# Patient Record
Sex: Male | Born: 1970 | Race: Black or African American | Hispanic: No | Marital: Single | Smoking: Current every day smoker
Health system: Southern US, Community
[De-identification: ages and names within clinical notes are randomized; demographics above are authoritative.]

## PROBLEM LIST (undated history)

## (undated) DIAGNOSIS — K219 Gastro-esophageal reflux disease without esophagitis: Secondary | ICD-10-CM

## (undated) DIAGNOSIS — M25559 Pain in unspecified hip: Secondary | ICD-10-CM

## (undated) DIAGNOSIS — F419 Anxiety disorder, unspecified: Secondary | ICD-10-CM

## (undated) DIAGNOSIS — I10 Essential (primary) hypertension: Secondary | ICD-10-CM

## (undated) DIAGNOSIS — F32A Depression, unspecified: Secondary | ICD-10-CM

## (undated) DIAGNOSIS — F1010 Alcohol abuse, uncomplicated: Secondary | ICD-10-CM

## (undated) DIAGNOSIS — F329 Major depressive disorder, single episode, unspecified: Secondary | ICD-10-CM

## (undated) HISTORY — PX: FACIAL RECONSTRUCTION SURGERY: SHX631

## (undated) HISTORY — PX: OTHER SURGICAL HISTORY: SHX169

---

## 1998-06-02 ENCOUNTER — Emergency Department (HOSPITAL_COMMUNITY): Admission: EM | Admit: 1998-06-02 | Discharge: 1998-06-02 | Payer: Self-pay | Admitting: Emergency Medicine

## 2003-12-05 ENCOUNTER — Inpatient Hospital Stay (HOSPITAL_COMMUNITY): Admission: AD | Admit: 2003-12-05 | Discharge: 2003-12-13 | Payer: Self-pay | Admitting: Oral Surgery

## 2003-12-05 ENCOUNTER — Emergency Department (HOSPITAL_COMMUNITY): Admission: EM | Admit: 2003-12-05 | Discharge: 2003-12-05 | Payer: Self-pay | Admitting: Emergency Medicine

## 2004-04-09 ENCOUNTER — Emergency Department (HOSPITAL_COMMUNITY): Admission: EM | Admit: 2004-04-09 | Discharge: 2004-04-10 | Payer: Self-pay | Admitting: Emergency Medicine

## 2004-10-03 ENCOUNTER — Inpatient Hospital Stay (HOSPITAL_COMMUNITY): Admission: EM | Admit: 2004-10-03 | Discharge: 2004-10-10 | Payer: Self-pay | Admitting: *Deleted

## 2004-12-22 ENCOUNTER — Emergency Department (HOSPITAL_COMMUNITY): Admission: EM | Admit: 2004-12-22 | Discharge: 2004-12-22 | Payer: Self-pay | Admitting: Emergency Medicine

## 2008-09-27 ENCOUNTER — Inpatient Hospital Stay (HOSPITAL_COMMUNITY): Admission: EM | Admit: 2008-09-27 | Discharge: 2008-10-04 | Payer: Self-pay | Admitting: Emergency Medicine

## 2008-10-01 ENCOUNTER — Ambulatory Visit: Payer: Self-pay | Admitting: Dentistry

## 2008-10-02 ENCOUNTER — Ambulatory Visit: Payer: Self-pay | Admitting: Infectious Diseases

## 2008-10-09 ENCOUNTER — Encounter: Admission: AD | Admit: 2008-10-09 | Discharge: 2008-10-09 | Payer: Self-pay | Admitting: Dentistry

## 2009-01-22 ENCOUNTER — Emergency Department (HOSPITAL_COMMUNITY): Admission: EM | Admit: 2009-01-22 | Discharge: 2009-01-22 | Payer: Self-pay | Admitting: Emergency Medicine

## 2009-01-28 ENCOUNTER — Emergency Department (HOSPITAL_COMMUNITY): Admission: EM | Admit: 2009-01-28 | Discharge: 2009-01-28 | Payer: Self-pay | Admitting: Emergency Medicine

## 2009-02-11 ENCOUNTER — Emergency Department (HOSPITAL_COMMUNITY): Admission: EM | Admit: 2009-02-11 | Discharge: 2009-02-11 | Payer: Self-pay | Admitting: Emergency Medicine

## 2009-02-16 ENCOUNTER — Emergency Department (HOSPITAL_COMMUNITY): Admission: EM | Admit: 2009-02-16 | Discharge: 2009-02-17 | Payer: Self-pay | Admitting: Emergency Medicine

## 2009-06-07 ENCOUNTER — Emergency Department (HOSPITAL_COMMUNITY): Admission: EM | Admit: 2009-06-07 | Discharge: 2009-06-07 | Payer: Self-pay | Admitting: Emergency Medicine

## 2009-06-07 ENCOUNTER — Inpatient Hospital Stay (HOSPITAL_COMMUNITY): Admission: RE | Admit: 2009-06-07 | Discharge: 2009-06-11 | Payer: Self-pay | Admitting: Psychiatry

## 2009-06-07 ENCOUNTER — Ambulatory Visit: Payer: Self-pay | Admitting: Psychiatry

## 2009-06-27 ENCOUNTER — Inpatient Hospital Stay (HOSPITAL_COMMUNITY): Admission: AD | Admit: 2009-06-27 | Discharge: 2009-07-04 | Payer: Self-pay | Admitting: Psychiatry

## 2009-06-27 ENCOUNTER — Ambulatory Visit: Payer: Self-pay | Admitting: Psychiatry

## 2009-12-10 ENCOUNTER — Emergency Department (HOSPITAL_COMMUNITY): Admission: EM | Admit: 2009-12-10 | Discharge: 2009-12-11 | Payer: Self-pay | Admitting: Emergency Medicine

## 2010-05-22 ENCOUNTER — Emergency Department (HOSPITAL_COMMUNITY): Admission: EM | Admit: 2010-05-22 | Discharge: 2009-06-27 | Payer: Self-pay | Admitting: Emergency Medicine

## 2010-08-31 LAB — COMPREHENSIVE METABOLIC PANEL
ALT: 35 U/L (ref 0–53)
AST: 51 U/L — ABNORMAL HIGH (ref 0–37)
AST: 39 U/L — ABNORMAL HIGH (ref 0–37)
Albumin: 3.6 g/dL (ref 3.5–5.2)
Albumin: 3.8 g/dL (ref 3.5–5.2)
Alkaline Phosphatase: 98 U/L (ref 39–117)
BUN: 12 mg/dL (ref 6–23)
BUN: 7 mg/dL (ref 6–23)
CO2: 27 mEq/L (ref 19–32)
Calcium: 8.2 mg/dL — ABNORMAL LOW (ref 8.4–10.5)
Calcium: 8.4 mg/dL (ref 8.4–10.5)
Chloride: 106 mEq/L (ref 96–112)
Creatinine, Ser: 0.89 mg/dL (ref 0.4–1.5)
Creatinine, Ser: 0.91 mg/dL (ref 0.4–1.5)
GFR calc Af Amer: >60 mL/min (ref >60)
GFR calc Af Amer: >60 mL/min (ref >60)
GFR calc non Af Amer: >60 mL/min (ref >60)
Glucose, Bld: 132 mg/dL — ABNORMAL HIGH (ref 70–99)
Potassium: 3.2 mEq/L — ABNORMAL LOW (ref 3.5–5.1)
Sodium: 141 mEq/L (ref 135–145)
Total Bilirubin: 0.2 mg/dL — ABNORMAL LOW (ref 0.3–1.2)
Total Protein: 7.1 g/dL (ref 6.0–8.3)
Total Protein: 7.5 g/dL (ref 6.0–8.3)

## 2010-08-31 LAB — DIFFERENTIAL
Basophils Absolute: 0 10*3/uL (ref 0.0–0.1)
Basophils Absolute: 0 10*3/uL (ref 0.0–0.1)
Basophils Relative: 1 % (ref 0–1)
Eosinophils Absolute: 0.1 10*3/uL (ref 0.0–0.7)
Eosinophils Relative: 2 % (ref 0–5)
Eosinophils Relative: 1 % (ref 0–5)
Lymphocytes Relative: 47 % — ABNORMAL HIGH (ref 12–46)
Lymphocytes Relative: 24 % (ref 12–46)
Lymphs Abs: 2.6 10*3/uL (ref 0.7–4.0)
Lymphs Abs: 0.9 10*3/uL (ref 0.7–4.0)
Monocytes Absolute: 0.6 10*3/uL (ref 0.1–1.0)
Monocytes Absolute: 0.4 10*3/uL (ref 0.1–1.0)
Monocytes Relative: 11 % (ref 3–12)
Monocytes Relative: 11 % (ref 3–12)
Neutro Abs: 2.1 10*3/uL (ref 1.7–7.7)
Neutro Abs: 2.5 10*3/uL (ref 1.7–7.7)
Neutrophils Relative %: 39 % — ABNORMAL LOW (ref 43–77)

## 2010-08-31 LAB — RAPID URINE DRUG SCREEN, HOSP PERFORMED
Amphetamines: NOT DETECTED
Amphetamines: NOT DETECTED
Barbiturates: NOT DETECTED
Barbiturates: NOT DETECTED
Benzodiazepines: POSITIVE — AB
Benzodiazepines: NOT DETECTED
Cocaine: NOT DETECTED
Cocaine: NOT DETECTED
Opiates: NOT DETECTED
Opiates: NOT DETECTED
Tetrahydrocannabinol: NOT DETECTED
Tetrahydrocannabinol: NOT DETECTED

## 2010-08-31 LAB — CBC
HCT: 34.5 % — ABNORMAL LOW (ref 39.0–52.0)
Hemoglobin: 12 g/dL — ABNORMAL LOW (ref 13.0–17.0)
MCH: 27.6 pg (ref 26.0–34.0)
MCHC: 34.9 g/dL (ref 30.0–36.0)
MCV: 87.9 fL (ref 78.0–100.0)
MCV: 81.1 fL (ref 78.0–100.0)
Platelets: 326 10*3/uL (ref 150–400)
Platelets: 265 10*3/uL (ref 150–400)
RBC: 3.93 MIL/uL — ABNORMAL LOW (ref 4.22–5.81)
RBC: 4.49 MIL/uL (ref 4.22–5.81)
RDW: 14.4 % (ref 11.5–15.5)
RDW: 15.5 % (ref 11.5–15.5)
WBC: 5.5 10*3/uL (ref 4.0–10.5)

## 2010-08-31 LAB — URINALYSIS, ROUTINE W REFLEX MICROSCOPIC
Bilirubin Urine: NEGATIVE
Glucose, UA: NEGATIVE mg/dL
Hgb urine dipstick: NEGATIVE
Ketones, ur: NEGATIVE mg/dL
Nitrite: NEGATIVE
Protein, ur: NEGATIVE mg/dL
Specific Gravity, Urine: 1.011 (ref 1.005–1.030)
Urobilinogen, UA: 0.2 mg/dL (ref 0.0–1.0)
pH: 6.5 (ref 5.0–8.0)

## 2010-08-31 LAB — TSH: TSH: 1.018 u[IU]/mL (ref 0.350–4.500)

## 2010-08-31 LAB — ETHANOL

## 2010-08-31 LAB — LIPASE, BLOOD: Lipase: 46 U/L (ref 11–59)

## 2010-08-31 LAB — RPR: RPR Ser Ql: NONREACTIVE

## 2010-09-03 ENCOUNTER — Emergency Department (HOSPITAL_COMMUNITY)
Admission: EM | Admit: 2010-09-03 | Discharge: 2010-09-03 | Disposition: A | Discharge status: Home / Self Care | Payer: Self-pay | Attending: Emergency Medicine | Admitting: Emergency Medicine

## 2010-09-03 DIAGNOSIS — F1010 Alcohol abuse, uncomplicated: Secondary | ICD-10-CM | POA: Insufficient documentation

## 2010-09-03 DIAGNOSIS — R112 Nausea with vomiting, unspecified: Secondary | ICD-10-CM | POA: Insufficient documentation

## 2010-09-03 LAB — GLUCOSE, CAPILLARY: Glucose-Capillary: 108 mg/dL — ABNORMAL HIGH (ref 70–99)

## 2010-09-15 LAB — DIFFERENTIAL
Basophils Absolute: 0 10*3/uL (ref 0.0–0.1)
Basophils Relative: 1 % (ref 0–1)
Eosinophils Absolute: 0 10*3/uL (ref 0.0–0.7)
Monocytes Absolute: 0.3 10*3/uL (ref 0.1–1.0)
Monocytes Relative: 9 % (ref 3–12)
Neutrophils Relative %: 57 % (ref 43–77)

## 2010-09-15 LAB — COMPREHENSIVE METABOLIC PANEL
ALT: 35 U/L (ref 0–53)
Alkaline Phosphatase: 107 U/L (ref 39–117)
Chloride: 103 mEq/L (ref 96–112)
Glucose, Bld: 93 mg/dL (ref 70–99)
Potassium: 3.6 mEq/L (ref 3.5–5.1)
Sodium: 140 mEq/L (ref 135–145)
Total Bilirubin: 0.5 mg/dL (ref 0.3–1.2)
Total Protein: 7.5 g/dL (ref 6.0–8.3)

## 2010-09-15 LAB — RAPID URINE DRUG SCREEN, HOSP PERFORMED: Tetrahydrocannabinol: NOT DETECTED

## 2010-09-15 LAB — CBC
Hemoglobin: 14 g/dL (ref 13.0–17.0)
RBC: 4.61 MIL/uL (ref 4.22–5.81)
RDW: 15.2 % (ref 11.5–15.5)
WBC: 3.2 10*3/uL — ABNORMAL LOW (ref 4.0–10.5)

## 2010-09-15 LAB — ETHANOL: Alcohol, Ethyl (B): 429 mg/dL (ref 0–10)

## 2010-09-19 LAB — RAPID URINE DRUG SCREEN, HOSP PERFORMED
Amphetamines: NOT DETECTED
Benzodiazepines: NOT DETECTED
Cocaine: POSITIVE — AB
Opiates: NOT DETECTED
Tetrahydrocannabinol: POSITIVE — AB

## 2010-09-19 LAB — HEPATIC FUNCTION PANEL
AST: 49 U/L — ABNORMAL HIGH (ref 0–37)
Albumin: 3.6 g/dL (ref 3.5–5.2)

## 2010-09-19 LAB — POCT I-STAT, CHEM 8
BUN: 11 mg/dL (ref 6–23)
Chloride: 102 mEq/L (ref 96–112)
Creatinine, Ser: 1.1 mg/dL (ref 0.4–1.5)
Potassium: 3.4 mEq/L — ABNORMAL LOW (ref 3.5–5.1)
Sodium: 144 mEq/L (ref 135–145)

## 2010-09-19 LAB — ETHANOL: Alcohol, Ethyl (B): 418 mg/dL (ref 0–10)

## 2010-09-24 LAB — BASIC METABOLIC PANEL
BUN: 5 mg/dL — ABNORMAL LOW (ref 6–23)
BUN: 6 mg/dL (ref 6–23)
Calcium: 8.9 mg/dL (ref 8.4–10.5)
Chloride: 100 mEq/L (ref 96–112)
Chloride: 105 mEq/L (ref 96–112)
Chloride: 100 mEq/L (ref 96–112)
Creatinine, Ser: 0.78 mg/dL (ref 0.4–1.5)
Creatinine, Ser: 0.78 mg/dL (ref 0.4–1.5)
Creatinine, Ser: 1 mg/dL (ref 0.4–1.5)
Creatinine, Ser: 0.78 mg/dL (ref 0.4–1.5)
GFR calc Af Amer: >60 mL/min (ref >60)
GFR calc Af Amer: >60 mL/min (ref >60)
GFR calc Af Amer: >60 mL/min (ref >60)
GFR calc non Af Amer: >60 mL/min (ref >60)
GFR calc non Af Amer: >60 mL/min (ref >60)
GFR calc non Af Amer: >60 mL/min (ref >60)
Glucose, Bld: 100 mg/dL — ABNORMAL HIGH (ref 70–99)
Potassium: 3.6 mEq/L (ref 3.5–5.1)
Potassium: 4.1 mEq/L (ref 3.5–5.1)
Sodium: 134 mEq/L — ABNORMAL LOW (ref 135–145)
Sodium: 137 mEq/L (ref 135–145)
Sodium: 139 mEq/L (ref 135–145)

## 2010-09-24 LAB — COMPREHENSIVE METABOLIC PANEL
Albumin: 3.3 g/dL — ABNORMAL LOW (ref 3.5–5.2)
BUN: 6 mg/dL (ref 6–23)
CO2: 27 mEq/L (ref 19–32)
Chloride: 101 mEq/L (ref 96–112)
Creatinine, Ser: 0.68 mg/dL (ref 0.4–1.5)
GFR calc non Af Amer: >60 mL/min (ref >60)
Total Bilirubin: 0.3 mg/dL (ref 0.3–1.2)

## 2010-09-24 LAB — CBC
HCT: 36.6 % — ABNORMAL LOW (ref 39.0–52.0)
HCT: 34.3 % — ABNORMAL LOW (ref 39.0–52.0)
Hemoglobin: 11.5 g/dL — ABNORMAL LOW (ref 13.0–17.0)
Hemoglobin: 12.1 g/dL — ABNORMAL LOW (ref 13.0–17.0)
MCHC: 34.4 g/dL (ref 30.0–36.0)
MCV: 82.6 fL (ref 78.0–100.0)
MCV: 82.9 fL (ref 78.0–100.0)
MCV: 80.2 fL (ref 78.0–100.0)
MCV: 81.5 fL (ref 78.0–100.0)
Platelets: 267 10*3/uL (ref 150–400)
Platelets: 290 10*3/uL (ref 150–400)
Platelets: 256 10*3/uL (ref 150–400)
RBC: 4.16 MIL/uL — ABNORMAL LOW (ref 4.22–5.81)
RBC: 4.09 MIL/uL — ABNORMAL LOW (ref 4.22–5.81)
RDW: 18.3 % — ABNORMAL HIGH (ref 11.5–15.5)
WBC: 4 10*3/uL (ref 4.0–10.5)
WBC: 4.2 10*3/uL (ref 4.0–10.5)
WBC: 4.7 10*3/uL (ref 4.0–10.5)
WBC: 5.3 10*3/uL (ref 4.0–10.5)
WBC: 7.3 10*3/uL (ref 4.0–10.5)

## 2010-09-24 LAB — DIFFERENTIAL
Basophils Absolute: 0 10*3/uL (ref 0.0–0.1)
Eosinophils Absolute: 0.1 10*3/uL (ref 0.0–0.7)
Lymphocytes Relative: 14 % (ref 12–46)
Lymphocytes Relative: 16 % (ref 12–46)
Lymphs Abs: 0.9 10*3/uL (ref 0.7–4.0)
Monocytes Relative: 13 % — ABNORMAL HIGH (ref 3–12)
Neutro Abs: 3.6 10*3/uL (ref 1.7–7.7)
Neutro Abs: 5.3 10*3/uL (ref 1.7–7.7)
Neutrophils Relative %: 69 % (ref 43–77)

## 2010-09-24 LAB — PROTIME-INR
INR: 1.1 (ref 0.00–1.49)
INR: 1 (ref 0.00–1.49)
Prothrombin Time: 14.1 seconds (ref 11.6–15.2)
Prothrombin Time: 13.3 seconds (ref 11.6–15.2)

## 2010-09-24 LAB — CULTURE, ROUTINE-ABSCESS: Gram Stain: NONE SEEN

## 2010-09-24 LAB — CULTURE, BLOOD (ROUTINE X 2)
Culture: NO GROWTH
Culture: NO GROWTH
Culture: NO GROWTH

## 2010-09-24 LAB — URINE CULTURE: Culture: NO GROWTH

## 2010-09-24 LAB — HIV ANTIBODY (ROUTINE TESTING W REFLEX): HIV: NONREACTIVE

## 2010-09-24 LAB — URINALYSIS, ROUTINE W REFLEX MICROSCOPIC
Bilirubin Urine: NEGATIVE
Glucose, UA: NEGATIVE mg/dL
Hgb urine dipstick: NEGATIVE
Ketones, ur: 15 mg/dL — AB
Protein, ur: NEGATIVE mg/dL

## 2010-09-24 LAB — HEPATITIS PANEL, ACUTE: Hepatitis B Surface Ag: NEGATIVE

## 2010-10-23 ENCOUNTER — Emergency Department (HOSPITAL_COMMUNITY)
Admission: EM | Admit: 2010-10-23 | Discharge: 2010-10-24 | Disposition: A | Discharge status: Home / Self Care | Payer: Self-pay | Attending: Emergency Medicine | Admitting: Emergency Medicine

## 2010-10-23 ENCOUNTER — Emergency Department (HOSPITAL_COMMUNITY): Payer: Self-pay

## 2010-10-23 DIAGNOSIS — R51 Headache: Secondary | ICD-10-CM | POA: Insufficient documentation

## 2010-10-23 DIAGNOSIS — F1010 Alcohol abuse, uncomplicated: Secondary | ICD-10-CM | POA: Insufficient documentation

## 2010-10-23 DIAGNOSIS — R404 Transient alteration of awareness: Secondary | ICD-10-CM | POA: Insufficient documentation

## 2010-10-26 ENCOUNTER — Emergency Department (HOSPITAL_COMMUNITY)
Admission: EM | Admit: 2010-10-26 | Discharge: 2010-10-27 | Disposition: A | Discharge status: Home / Self Care | Payer: Self-pay | Attending: Emergency Medicine | Admitting: Emergency Medicine

## 2010-10-26 ENCOUNTER — Emergency Department (HOSPITAL_COMMUNITY): Payer: Self-pay

## 2010-10-26 DIAGNOSIS — R4182 Altered mental status, unspecified: Secondary | ICD-10-CM | POA: Insufficient documentation

## 2010-10-26 DIAGNOSIS — I10 Essential (primary) hypertension: Secondary | ICD-10-CM | POA: Insufficient documentation

## 2010-10-26 DIAGNOSIS — F1010 Alcohol abuse, uncomplicated: Secondary | ICD-10-CM | POA: Insufficient documentation

## 2010-10-26 LAB — CBC
HCT: 36.5 % — ABNORMAL LOW (ref 39.0–52.0)
Hemoglobin: 12.3 g/dL — ABNORMAL LOW (ref 13.0–17.0)
MCH: 27.5 pg (ref 26.0–34.0)
MCHC: 33.7 g/dL (ref 30.0–36.0)
RBC: 4.48 MIL/uL (ref 4.22–5.81)

## 2010-10-26 LAB — DIFFERENTIAL
Lymphocytes Relative: 38 % (ref 12–46)
Monocytes Absolute: 0.4 10*3/uL (ref 0.1–1.0)
Monocytes Relative: 11 % (ref 3–12)
Neutro Abs: 1.9 10*3/uL (ref 1.7–7.7)

## 2010-10-26 LAB — COMPREHENSIVE METABOLIC PANEL
ALT: 29 U/L (ref 0–53)
BUN: 5 mg/dL — ABNORMAL LOW (ref 6–23)
CO2: 31 mEq/L (ref 19–32)
Calcium: 7.9 mg/dL — ABNORMAL LOW (ref 8.4–10.5)
Creatinine, Ser: 0.75 mg/dL (ref 0.4–1.5)
GFR calc non Af Amer: >60 mL/min (ref >60)
Glucose, Bld: 95 mg/dL (ref 70–99)
Sodium: 143 mEq/L (ref 135–145)
Total Protein: 6.9 g/dL (ref 6.0–8.3)

## 2010-10-26 LAB — POCT I-STAT, CHEM 8
BUN: 3 mg/dL — ABNORMAL LOW (ref 6–23)
Chloride: 102 mEq/L (ref 96–112)
Creatinine, Ser: 1.5 mg/dL (ref 0.4–1.5)
Potassium: 6.1 mEq/L — ABNORMAL HIGH (ref 3.5–5.1)
Sodium: 141 mEq/L (ref 135–145)

## 2010-10-26 LAB — SALICYLATE LEVEL: Salicylate Lvl: 0.1 mg/dL — ABNORMAL LOW (ref 2.8–20.0)

## 2010-10-26 LAB — ETHANOL: Alcohol, Ethyl (B): 428 mg/dL (ref 0–10)

## 2010-10-27 LAB — URINALYSIS, ROUTINE W REFLEX MICROSCOPIC
Hgb urine dipstick: NEGATIVE
Nitrite: NEGATIVE
Protein, ur: NEGATIVE mg/dL
Specific Gravity, Urine: 1.01 (ref 1.005–1.030)
Urobilinogen, UA: 0.2 mg/dL (ref 0.0–1.0)

## 2010-10-27 LAB — RAPID URINE DRUG SCREEN, HOSP PERFORMED
Amphetamines: NOT DETECTED
Barbiturates: NOT DETECTED
Opiates: NOT DETECTED

## 2010-10-28 NOTE — Consult Note (Signed)
Kevin [name], Kevin [name] NO.:  [id number]   MEDICAL RECORD NO.:  [id number]          PATIENT TYPE:  INP   LOCATION:  5128                         FACILITY:  [facility]   PHYSICIAN:  Charlynne Pander, D.D.S.DATE OF BIRTH:  1970/12/30   DATE OF CONSULTATION:  10/01/2008  DATE OF DISCHARGE:                                 CONSULTATION   Kevin [name] is a 40 year old male referred by Dr. Tamsen Roers and Dr.  Dutch Quint for dental consultation.  The patient recently admitted with  a history of right facial swelling.  A dental consultation requested to  evaluate possible etiology for the facial swelling and provide treatment  as indicated.   MEDICAL HISTORY:  1. Right facial swelling with fistulous tract along border of mandible      in the area of the molar #31.  No current IV antibiotic therapy.  2. History of right angle of mandible fracture-S/P ORIF with plating      with Dr. Shea Evans on December 10, 2003.  3. History of tibial plateau fracture status post ORIF with Dr. Carola Frost      on October 07, 2004.  4. History of hypertension.  No current medications.   ALLERGIES:  None known.   MEDICATIONS:  1. Lovenox 40 mg subcu daily.  2. Multivitamin daily.  3. Thiamine 100 mg daily.  4. Ativan 1 mg IV 24 hours per protocol for DT prevention.   SOCIAL HISTORY:  The patient is homeless.  The patient with a history of  smoking one-half per day for at least 20+ years.  The patient does have  a hsitory of alcohol abuse.   FAMILY HISTORY:  Noncontributory.  Mother and father both alive and  healthy.   FUNCTIONAL ASSESSMENT:  The patient remains independent for ADLs at this  time.   REVIEW OF SYSTEMS:  This was reviewed from the chart for this admission.   DENTAL HISTORY:  Chief complaint.  A dental consultation requested to  evaluated right facial swelling and provide treatment as indicated.   HISTORY OF PRESENT ILLNESS:  The patient was recently with a history of  right  facial swelling and fistulous tract in the area of the right  mandible area #31.  The patient not currently on IV antibiotics therapy.  The patient currently denies acute pain coming from this area.  The  patient indicates that there may be some swelling in his mouth.   The patient last saw dentist approximately 3-4 months ago.  The patient  indicates that he was in jail and had dentist performed an extraction of  lower left molar.  The patient denies having any complications from a  dental extraction.  The patient does not seek regular dental care.  The  patient also with a history of right angle of mandible fracture, which  was treated with plating by Dr. Shea Evans (Oral Surgeon) on December 10, 2003.  Medical team is concerned that there may be some etiology associated  with the previous mandible fracture relating to the current right facial  swelling and infection.   PHYSICAL EXAMINATION:  GENERAL:  The patient is a  well-developed, well-  nourished male in no acute distress.  VITAL SIGNS:  Blood pressure is 141/84, pulse 67, respirations are 18,  and temperature is 98.2.  NECK:  There is some lymphadenopathy noted at this time.  There is some  right facial swelling.  There is a fistulous tract in the area above the  border of the mandible relatively in the area of tooth #31.  This  appears to be more anterior than the angle of the mandible.   DENTITION:  The patient with multiple missing teeth.  The patient with  impact to tooth #1.  The patient with retained roots in the area of #31.   PERIODONTAL:  The patient with chronic advanced periodontal disease with  plaque and calculus accumulations, generalized gingival recession, and  tooth mobility.   DENTAL CARIES:  Dental caries associated with retained roots area #31.  I would need a full series of dental radiographs to identify the extent  of the dental caries.   ENDODONTIC:  The patient currently denies acute pulpitis symptoms.   The  patient does have periapical pathology in the area of routine roots #31.  This maybe the source of the fistulous tract.  I cannot totally rule out  some relationship to the previous mandible fracture till I further  evaluate intraorally during the anticipated surgery.   CROWN OR BRIDGE:  There are no crown or bridge restorations.   PROSTHODONTIC:  The patient denies presence of partial dentures.   OCCLUSION:  The patient with a poor occlusal scheme with a stable  occlusion at this time.   RADIOGRAPHIC INTERPRETATION:  A panoramic x-ray was taken.   There are multiple missing teeth.  There is impacted tooth #1.  There  are retained roots in the area tooth #1 with significant periapical  pathology and radiolucency.  There is some darkness along the line at  the previous angle of mandible fracture, but this could be radiographic  artifact.  There is a plate associated with previous treatment of the  mandible fracture in June 2005 with plate and 4 screws.  There is a poor  occlusal scheme noted.   ASSESSMENT:  1. History of oral neglect.  2. Buccal swelling in the area of retained roots #31.  3. Periapical pathology noted on dental radiograph.  4. Right facial swelling and fistulous tract in the area along the      right border of the mandible in the area of the retained roots #31.      This also could be however contributed to possible previous angle      of mandible fracture, although etiology related to retained roots      for periapical pathology is more probable.  5. Chronic periodontitis with bone loss.  6. Selective areas of gingival recession.  7. Poor occlusal scheme with a stable occlusion.  8. No history of partial dentures.  9. Current Lovenox therapy with a risk for bleeding with invasive      dental procedure.   PLAN/RECOMMENDATIONS:  1. I discussed the risks, benefits, and complications of various      treatment options with the patient in relationship to his  medical      and dental conditions.  We discussed various treatment options to      include no treatment, extraction of indicated teeth with      alveoloplasty, periodontal therapy, dental restorations, root cnacl      therapy, crown or bridge therapy, implant therapy, and replacing  missing teeth as indicated.  The patient currently wished to      proceed with extraction of retained roots in the area of #31 with      alveoplasty as indicated.  The patient also agrees to extraction of      other teeth if deemed necessary during the examination in the      operating room.  We will also provide gross debridement of the      remaining dentition at this time.  Suggest starting clindamycin, IV      antibiotic therapy at this time with additional antibiotic therapy      as indicated probably for several weeks.  Suggest consulting      Infectious Disease for appropriate antibiotic therapy to allow for      healing of the fistulous tract.  The operating room procedure is      tentatively being scheduled for Tuesday at 1 p.m.  2. Discussion and findings with Dr. Tamsen Roers, and all the Medical Team      as indicated.      Charlynne Pander, D.D.S.  Electronically Signed     RFK/MEDQ  D:  10/01/2008  T:  10/02/2008  Job:  [id number]   cc:   Beckey Rutter, MD  Suzanna Obey, M.D.  Lyndal Pulley Chales Salmon, M.D.

## 2010-10-28 NOTE — Discharge Summary (Signed)
Kevin [name], RINEER NO.:  [id number]   MEDICAL RECORD NO.:  [id number]          PATIENT TYPE:  INP   LOCATION:  5128                         FACILITY:  [facility]   PHYSICIAN:  Beckey Rutter, MD  DATE OF BIRTH:  [birth year]   DATE OF ADMISSION:  09/27/2008  DATE OF DISCHARGE:                               DISCHARGE SUMMARY   PRIMARY CARE PHYSICIAN:  The patient is not following up with physician.  He is unassigned, to [facility].   CHIEF COMPLAINT:  Right-sided neck abscess.   HISTORY OF PRESENT ILLNESS:  Mr. Sardo is a 40 years old pleasant  African American male with past medical history significant for  homelessness; medical noncompliance; history of broken right lower jaw,  status post plate, done by Dr. Shea Evans; and substance abuse presented  because of drainage from right-sided anterior neck abscess.   HOSPITAL COURSE:  During hospital course, the patient has CT scan.  The  result is below.  He was seen in consultation by Dr. Jearld Fenton with ENT and  he was seen by Dental Service.  The patient currently is in the  operation room for dental abscess removal.   HOSPITAL PROCEDURE:  1. CT neck without contrast on September 27, 2008, impression is soft      tissue defect and soft tissue swelling inferolateral to the      posterior body of the mandible in the right, without abscess, there      is 8 mm well corticated oval lucency in the distant portion of the      mandible.  Extended through the cortex.  Possibly related to the      patient's previous infection and fracture in the area.  2. Orthopantogram.  Impression is large cavity in the posterior most      right lower tooth.   DISCHARGE DIAGNOSES:  1. Right facial swelling with fistulous tract in the area of the molar      #31.  No current IV antibiotic therapy.  2. History of right angle of mandible fracture, status post open      reduction and internal fixation with plating with Dr. Shea Evans on      December 10, 2003.  3. History of tibial plateau fracture, status post open reduction and      internal fixation with Dr. Carola Frost, October 07, 2004.  4. History of hypertension.  5. Homelessness.  6. Medical noncompliance.   DISCHARGE MEDICATIONS:  Discharge medications will be dictated on the  day of actual discharge.  This is an interim discharge summary.     Beckey Rutter, MD  Electronically Signed    EME/MEDQ  D:  10/02/2008  T:  10/03/2008  Job:  [id number] [id number]

## 2010-10-28 NOTE — Discharge Summary (Signed)
[name], [name] NO.:  [id number]   MEDICAL RECORD NO.:  [id number]          PATIENT TYPE:  INP   LOCATION:                               FACILITY:  [facility]   PHYSICIAN:  Kevin [name], M.D.    DATE OF BIRTH:  June 26, 1970   DATE OF ADMISSION:  09/27/2008  DATE OF DISCHARGE:  10/04/2008                               DISCHARGE SUMMARY   ADDENDUM:  Please see the previous discharge summary dictated by Dr.  Tamsen Roers.  This is an Addendum.   CONSULTATIONS:  1. Kevin [name], DDS.  2. Kevin Sax, MD.   ADDITIONAL PROCEDURES PERFORMED:  Extraction of tooth #31 with  alveoloplasty and gross debridement of the remaining dentition by Dr.  Kristin [name] on October 02, 2008.   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 The patient was  taken to the operating room by Dr. Kristin [name] on October 02, 2008.  He  performed extraction of tooth 31 with alveoloplasty and gross  debridement of the remaining dentition.  Per his assessment, the patient  had apical periodontitis, chronic periodontitis, periapical abscess, and  right facial swelling with a fistulous tract to the right border of the  mandible.  The operation was a success.  The patient was continued on  antibiotic treatment as previously prescribed.  Infectious Diseases  physician, Dr. Ninetta [name], continued to follow the patient clinically and  made recommendations accordingly.  He recommended the discontinuation of  clindamycin and initiation of Primaxin.  Over the past 24 hours, the  patient has been completely afebrile and his white blood cell count is  within normal limits.  Dr. Ninetta [name] evaluated the patient today and felt  that he was stable enough for hospital discharge.  He recommended 6  months of oral antibiotic therapy with either Augmentin, if he could  afford it, or doxycycline which is less expensive.  Therefore, the  patient will be  discharged on doxycycline 100 mg 2 times daily for 6  more months.  The case manager, Kevin [name], was instrumental in obtaining at  least a 30-day supply of doxycycline until the patient is able to follow  up with the [facility] on Oct 22, 2008.  In addition, the  patient was given a prescription for doxycycline 100 mg 2 times daily,  #60 with 5 refills, to carry to either the [facility] pharmacy or the [facility] pharmacy to acquire his medications for $4 per prescription.  The  patient was given these instructions.  In addition, Kevin [name], the case  manager, provided the patient with other resources he could pursue to  obtain assistance with finances for medications and healthcare.  The  patient received the information with appreciation.  He was advised to  follow up with Dr. Ninetta [name] in the infectious diseases clinic in 2 to 4  weeks.  He was also advised to follow up with Dr. Kristin [name] in 2 to 4  weeks.   DISCHARGE DISPOSITION:  The patient is currently stable and in improved  condition.  He is currently eating and drinking without too much  pain.  He was strongly advised to avoid alcohol, illicit drugs, and tobacco as  all of these substances interfere with wound healing.  The patient was  given an appointment to follow up with PA Kevin [name], at the  [facility], on Oct 22, 2008, at 10:30 a.m.  He was also advised  to follow up with Dr. Ninetta [name] and Dr. Kristin [name] in a few weeks.  Their  phone numbers were given to the patient.   DISCHARGE MEDICATIONS:  1. Percocet 5/325 mg 1 tablet every 4 hours as needed for pain.  2. Doxycycline 100 mg b.i.d. for 6 months.      Kevin [name], M.D.  Electronically Signed     DF/MEDQ  D:  10/04/2008  T:  10/04/2008  Job:  [id number]   cc:   Kevin Modena, PA  Kevin [name], M.D.  Kevin [name], D.D.S.

## 2010-10-28 NOTE — Op Note (Signed)
[name], [name] NO.:  [id number]   MEDICAL RECORD NO.:  [id number]          PATIENT TYPE:  INP   LOCATION:  5128                         FACILITY:  [facility]   PHYSICIAN:  Charlynne Pander, D.D.S.DATE OF BIRTH:  January 01, 1971   DATE OF PROCEDURE:  10/02/2008  DATE OF DISCHARGE:                               OPERATIVE REPORT   PREOPERATIVE DIAGNOSES:  1. History of right facial swelling with fistulous tract to the right      border of the mandible.  2. History of periapical abscess.  3. Apical periodontitis.  4. Multiple retained root segments in the area #31.  5. Chronic periodontitis.  6. Accretions.   POSTOPERATIVE DIAGNOSES:  1. History of right facial swelling with fistulous tract to the right      border of the mandible.  2. History of periapical abscess.  3. Apical periodontitis.  4. Multiple retained root segments in the area #31.  5. Chronic periodontitis.  6. Accretions.   PROCEDURES.:  1. Extraction of tooth #31 with alveoloplasty.  2. Gross debridement of the remaining dentition.   SURGEON:  Charlynne Pander, DDS   ASSISTANT:  Cindy Hazy (dental assistant).   ANESTHESIA:  General anesthesia via nasoendotracheal tube.   MEDICATIONS:  1. Clindamycin 600 mg IV prior to invasive dental procedures.  2. Local anesthesia with a total utilization 1 carpule containing 34      mg lidocaine with 0.017 mg of epinephrine as well as one carpule      containing 9 mg of bupivacaine with 0.009 mg of epinephrine.   SPECIMENS:  There was one tooth that was discarded.   DRAINS:  None.   CULTURES:  None.   ESTIMATED BLOOD LOSS:  50 mL.   FLUIDS:  1200 mL lactated Ringer solution.   INDICATIONS:  The patient was admitted with a history right facial  swelling with fistulous tract to the right border of the mandible.  Dental consultation was requested to evaluate the etiology and to  provide dental treatment as indicated.  The patient was  examined and  treatment plan for extraction of indicated teeth with alveoloplasty  along with gross debridement of the remaining teeth as indicated.  This  treatment is formulated to decrease the risk and complications from  further affecting the patient's systemic health.   OPERATIVE FINDINGS:  The patient was examined in the operating room #1.  Tooth #31 was identified for extraction.  No other teeth were deemed  necessary to be extracted at this time, although tooth #22 had some  mobility, but the patient refused at this tooth extracted on previous  discussion with him prior to dental treatment.  The patient was also  noted to be affected by a history of right buccal abscess in the area  #31, apical periodontitis, chronic periodontitis, retained roots in the  area #31, and significant accretions.   DESCRIPTION OF PROCEDURE:  The patient was brought to the main operating  room #1.  The patient was then placed in a supine position on the  operating room table.  General anesthesia was induced via  nasoendotracheal tube.  The patient was then prepped and draped in the  usual manner for dental medicine procedure.  A time-out was performed.  The patient was identified, and procedures were verified.  A throat pack  was placed at this time.  The oral cavity was then thoroughly examined  with findings as noted above.  The patient was then ready for the dental  medicine procedure as follows:   Local anesthesia was administered sequentially with total utilization of  1 carpule containing 9 mg of bupivacaine with 0.009 mg of epinephrine as  well as 1 carpule containing 34 mg of lidocaine with 0.017 mg of  epinephrine.   The mandibular right quadrant was first approached.  The patient was  given an inferior alveolar nerve block utilizing the bupivacaine with  epinephrine.  Further infiltration was then achieved around retained  roots #31 with the lidocaine with epinephrine.  At this point in  time, a  15-blade incision was made from the distal #32 and extended to the  mesial #29.  A surgical flap was then carefully reflected.  Appropriate  amounts of buccal and interseptal bone were removed with surgical  handpiece and bur with copious amounts of sterile saline.  The roots  were then elevated out with a series of Cryer elevators without  complications.  Alveoplasty was then performed utilizing rongeurs and  bone file.  The surgical site was then irrigated with copious amounts of  sterile saline x4.  Please note, there was no purulence noted at the  time of the extraction.  At this point in time, the tissues were  approximated and trimmed appropriately.  The surgical site was then  closed from the distal #32 and extended to distal #29 utilizing 3-0  chromic gut suture in a continuous interrupted suture technique x1.   At this point in time, the remaining teeth were approached.  A sonic  scaler was then utilized to remove significant and gross amounts of  accretions.  A series of hand curettes were then utilized to assist in  the removal of accretions.  The sonic scaler was then again used to  remove further accretions.  At this point in time, the entire mouth was  irrigated with copious amounts of sterile saline.  Seeing no  complications, then dental medicine procedure was deemed to be complete.  The throat pack was removed at this time.  The patient was then handed  over to anesthesia team for final disposition.  After appropriate amount  of time, the patient was extubated and  taken to the Post Anesthesia  Care Unit with stable vital signs and good oxygenation level.  All  counts were correct for the dental medicine procedure.  The patient will  be maintained on appropriate IV antibiotic therapy, and an Infectious  Disease consult will be requested to evaluate the appropriate oral  antibiotic to use to assist in healing of the extraction site as well as  the fistulous  tract.  Dr. Tamsen Roers has been informed of this and will  assist in obtaining a referral from Infectious  Disease as indicated.  The patient can be seen in approximately 1 week  for evaluation for suture removal, but the sutures will dissolve on  their own.  Postop visit can be scheduled by calling [phone] at the  Dental Medicine Clinic at [facility].      Charlynne Pander, D.D.S.  Electronically Signed     RFK/MEDQ  D:  10/02/2008  T:  10/03/2008  Job:  [id number]   cc:   Beckey Rutter, MD  Lyndal Pulley Chales Salmon, M.D.

## 2010-10-28 NOTE — H&P (Signed)
Kevin [name], Kevin [name] NO.:  [id number]   MEDICAL RECORD NO.:  [id number]          PATIENT TYPE:  INP   LOCATION:  5128                         FACILITY:  [facility]   PHYSICIAN:  Gardiner Barefoot, MD    DATE OF BIRTH:  05-24-1971   DATE OF ADMISSION:  09/26/2008  DATE OF DISCHARGE:                              HISTORY & PHYSICAL   PRIMARY CARE PHYSICIAN:  Unassigned.   CHIEF COMPLAINT:  Swelling of his right side of his neck and jaw.   HISTORY OF PRESENT ILLNESS:  The patient is a 40 year old male, homeless  with a history of mandibular fracture and several fossa spaces that were  infected back in June 2005, requiring IV antibiotics, culture with  multiple organisms that were non-Staph aureus and non-croup A who  presents with swelling of the right side of his mandible and neck and  white pussy drainage.  The patient reports that he has had these  episodes periodically since the surgery in 2005 and in fact he has been  on many courses of antibiotics over that time, the last being  approximately 4 months ago when he was in jail.  He said that it works  for while until the last several days when he noticed it started to  swell again.  The patient denies any fever and chills.  He otherwise has  no complaints.  The surgery at that time was done by Dr. Graylon Gunning.   PAST MEDICAL HISTORY:  Submandibular angle fracture with abscesses in  2005, tibial plateau fracture with ORIF by Dr. Carola Frost in 2006.   MEDICATIONS:  None.   ALLERGIES:  No known drug allergies.   SOCIAL HISTORY:  The patient is homeless, uses tobacco and occasional  alcohol.   FAMILY HISTORY:  Noncontributory.   REVIEW OF SYSTEMS:  Noncontributory.   PHYSICAL EXAMINATION:  VITAL SIGNS:  Temperature is 98.5, pulse is 102,  blood pressure is 166/102, and O2 saturations 99%.  GENERAL:  The patient is awake, alert, and oriented x3 and appears in no  acute distress.  HEENT:  Neck swelling right side  with no obvious fluctuance, no warmth,  draining lesion on his right mandible.  Poor dentition, but no intraoral  abscess.  CARDIOVASCULAR:  Regular rate and rhythm.  No murmurs, rubs, or gallops.  LUNGS:  Clear to auscultation bilaterally.  ABDOMEN:  Soft, nontender, nondistended with positive bowel sounds.  No  hepatosplenomegaly.   LABORATORY DATA:  Sodium 135, potassium 3.3, chloride 101, bicarb 27,  BUN 6, creatinine 0.68, glucose 91, AST 33, ALT 20, alk phos 142, INR is  1.0, UA is negative.  WBC 7.3 with 73% neutrophils, hemoglobin 11,  platelets 256.  CT scan shows soft tissue defect and swelling, but no  abscess and also no lucency.   ASSESSMENT AND PLAN:  1. Soft tissue swelling.  Concerned for this would be that it is a      chronic osteomyelitis, particularly with its recurrence frequently      despite oral antibiotic therapy.  There is no obvious area to drain      by the  CT scan, so most likely this has reached the bone.  I will      check some blood cultures, but the patient really would have      benefit from a biopsy of the bone in that area.  The patient has      received antibiotics in the emergency room, so we would need to      hold antibiotics for now and wait for the appropriate time to      culture that the bone off of antibiotics.  However, if his culture      is negative, he likely would need a broader spectrum antibiotics to      treat the lesion and probably need for significant amount of time.      We will have Dr. Mendel Corning service alerted to the patient and in      the meantime, we will hold off on an antibiotics.  2. Hypokalemia.  We will replace.  3. Hypertension.  We will continue to monitor this, the patient is      asymptomatic at this time.      Gardiner Barefoot, MD  Electronically Signed     RWC/MEDQ  D:  09/27/2008  T:  09/27/2008  Job:  [id number]

## 2010-10-31 NOTE — Op Note (Signed)
NAME:  Kevin [name], Kevin [name]                     ACCOUNT NO.:  [id number]   MEDICAL RECORD NO.:  [id number]                   PATIENT TYPE:  INP   LOCATION:  5703                                 FACILITY:  [facility]   PHYSICIAN:  Sable Feil., D.D.S.        DATE OF BIRTH:  12-27-1970   DATE OF PROCEDURE:  12/10/2003  DATE OF DISCHARGE:                                 OPERATIVE REPORT   PREOPERATIVE DIAGNOSIS:  Right mandibular angle fracture.   POSTOPERATIVE DIAGNOSIS:  Right mandibular angle fracture.   OPERATION PERFORMED:   SURGEON:  Lovena Le, D.D.S.   ASSISTANT:   ANESTHESIA:   DESCRIPTION OF PROCEDURE:  The patient was brought to the operating room in  satisfactory condition and placed on the operating table in the supine  position.  Following successful induction of general anesthesia via nasal  endotracheal intubation, the patient was prepped and draped in the usual  fashion for a procedure of this type.  It should be noted that earlier in  the day, the patient developed hemorrhage from his previously placed drain  site in the right submandibular.  Initially, efforts were directed at  controlling the hemorrhage which was achieved with electrocautery and  clamping and tying off of small vessels in the region.  Next the attention  was turned to the fracture site.  An oropharyngeal throat pack was placed  which was removed prior to the conclusion of the case.  Following this  approximately 6 mL of a 2% lidocaine solution with 1:100,000 epinephrine was  infiltrated into the right posterior mandibular region after allowing  adequate time for local anesthesia and hemostasis.  The open fracture site  was extended with a 15 scalpel thereby exposing the right angle region of  the mandible.  A periosteal elevator was utilized to raise a full thickness  mucoperiosteal flap further exposing the fracture site at the right  mandibular angle.  The superior border of the  proximal and distal segments  were then repositioned in to the normal anatomic position and a superior  border plate was then adapted to the bony segments.  The plate was a  Leibinger 2.0 locking trauma plate.  It was secured to the distal and  proximal segments with four 2.0 x 6 to 8 mm screws.  Next, the Leibinger  maxillomandibular fixation set was utilized to secure the patient into  maxillomandibular fixation.  Interosseous screws were placed into the  maxilla and mandible, three screws placed in the maxilla and three in the  mandible.  Two of the screws were placed in the posterior regions of the  maxilla and mandible just superior to the maxillary teeth and a midline  screw was placed as well.  The same procedure was then carried out in the  mandible. Two posterior screws were placed at the apical portions of the  dentition proximally and the mandibular midline fixation screw was placed as  well.  The throat pack  was removed and the fixation screws were then linked  with 24 gauge stainless steel wire, thereby placing the patient into  maxillomandibular fixation.  The operative site was thoroughly irrigated and  suctioned dry and closed with multiple 3-0 Vicryl sutures.  It was noted  that good hemostasis was noted to exist at this point and the patient's  mandible was now in its normal anatomic position.  This  completed the procedure.  The patient was allowed to recover from general  anesthesia and was extubated and transported to the post anesthesia care  unit in satisfactory condition.  All sponge, needle and instrument counts  were correct at the conclusion of the case.                                               Sable Feil., D.D.S.    JWB/MEDQ  D:  12/11/2003  T:  12/11/2003  Job:  [id number]

## 2010-10-31 NOTE — Op Note (Signed)
[name], [name]:  [id number]   MEDICAL RECORD NO.:  [id number]          PATIENT TYPE:  INP   LOCATION:  5041                         FACILITY:  [facility]   PHYSICIAN:  Doralee Albino. Carola Frost, M.D. DATE OF BIRTH:  [birth year]   DATE OF PROCEDURE:  10/07/2004  DATE OF DISCHARGE:                                 OPERATIVE REPORT   PREOPERATIVE DIAGNOSIS:  Left unicondylar tibial plateau fracture.   POSTOPERATIVE DIAGNOSIS:  Left unicondylar tibial plateau fracture.   OPERATION PERFORMED:  Open reduction internal fixation of left unicondylar  tibial plateau fracture.   SURGEON:  Doralee Albino. Carola Frost, M.D.   ASSISTANT:  Cecil Cranker, PA   ANESTHESIA:  General.   COMPLICATIONS:  None.   DRAINS:  None.   ESTIMATED BLOOD LOSS:  20 cc.  Additional aspiration 55 cc of hemarthrosis.   DISPOSITION:  To PACU, condition stable.   INDICATIONS FOR PROCEDURE:  Kevin [name] is a 40 year old black male  well-known to the orthopedic service for  his left medial plateau fracture.  He underwent a knee immobilization and IV pain control while waiting for  resolution of his skin swelling.  After discussion of the risks and benefits  of surgery which included the possibility of wound breakdown, infection,  delayed union, nonunion, arthritis and neurovascular injury, the patient  wished to proceed.  He was also informed of the risks of thromboembolic  complications.   DESCRIPTION OF PROCEDURE:  Kevin [name] was taken to the operating room  where his left lower extremity was prepped and draped in the usual sterile  fashion.  A tourniquet was put about the thigh but never inflated during the  case.  He was administered Ancef preoperatively.  A standard 5 cm incision  off the medial plateau was made and dissection carried carefully down to the  subcutaneous tissue where branches of the saphenous nerve were isolated and  protected.  We were able to get down to  subchondral bone proximally and then  used a Cobb to tunnel under the pes tendons. The sartorius fascia was split  in line with its fibers and then repaired at the conclusion of the case  being careful not to inadvertently put sutures through the hamstring  tendons.  The periosteum was also incised distal to the pes insertion and a  Cobb used to again develop this plane for insertion of the plate.  A Zimmer  anatomic medial plateau plate was then selected and inserted.  Its  appropriate position defined on AP and lateral images and held with a  provisional K-wire.  The [employer] clamp was then used through a  percutaneous stab incision along the lateral side of the joint to achieve  compression of the fracture site which appeared to reduce anatomically.  We  then placed into the anterior and posterior hole, two lag screws using 4-5  hardware with overdrilling of the near cortex.  This provided for excellent  compression across the fracture site.  We then placed three screws distal to  the exit of the fracture along the medial condyle.  This provided for  buttressing  of that fracture.  This resulted in what appeared to be near  anatomic reduction of the articular surface as well as a restoration of  alignment.  Appropriate hardware position and length as well as reduction  was confirmed on AP and lateral images.  The wounds were irrigated and  closed in standard layered fashion with 0 Vicryl for the deep periosteal  layers, sartorius fascia and 2-0 for subcu and staples for the skin.  A well-  padded sterile dressing and knee immobilizer then applied.  The patient was  taken to the PACU in stable condition.   PROGNOSIS:  Kevin [name] should do well regarding this fracture.  His  alignment appears to have been restored as well as his articular surface.  His knee was also examined while anesthetized and postoperatively and this  was found to be stable to varus, valgus and anterior posterior  stress.  He  was injected with Marcaine into the knee as well as along the plate for  additional comfort.  He will be able to initiate range of motion on  postoperative day 2 once his incision is examined and found to be in stable  condition.  He will require DVT prophylaxis while in the hospital and we  will encourage him to take a baby aspirin postoperatively.  We are concerned  about compliance and follow-up in this patient who does not have a known  address.  We will work with the Child psychotherapist to facilitate this.  He does  have friends that visited during his hospitalization and we were hopeful  they can help [place] establish an appropriate follow-up plan.  He will require  nonweightbearing for six to eight weeks.      MHH/MEDQ  D:  10/07/2004  T:  10/07/2004  Job:  [id number]
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
## 2010-10-31 NOTE — Discharge Summary (Signed)
NAME:  Kevin [name], Kevin [name]                     ACCOUNT NO.:  [id number]   MEDICAL RECORD NO.:  [id number]                   PATIENT TYPE:  INP   LOCATION:  5703                                 FACILITY:  [facility]   PHYSICIAN:  Sable Feil., D.D.S.        DATE OF BIRTH:  [birth year]   DATE OF CONSULTATION:  DATE OF DISCHARGE:  12/13/2003                                 DISCHARGE SUMMARY   ADMISSION DIAGNOSES:  1. Right masseteric and pterygoid space infection.  2. Right mandibular angle fracture compound.  3. Right orbital floor fracture.   HOSPITAL COURSE:  The patient was admitted on June 22 and consultation were  obtained from the Infectious Disease Service and Internal Medicine Service  the next day.  December 06, 2003, the patient was taken to the OR for incision  and drainage of multiple fascial spaces including the right masseteric and  pterygoid spaces.  He tolerated the procedure well.  He defervesced, and his  white blood cell count normalized.  He was treated with IV antibiotics per  the Infectious Disease service recommendations.  On Monday, June 27, he was  taken back to the OR for open reduction internal fixation of his right  mandibular fracture.  He tolerated this procedure well.  He remained in the  hospital for postoperative care and continued IV antibiotic therapy.  The  remainder of his hospital course was unremarkable.   DISCHARGE MEDICATIONS:  1. Augmentin 400 mg per 5 ml 2 teaspoons b.i.d. per the recommendations of     Dr. Roxan Hockey of Infectious Disease Service.  2. Lortab Elixir 1-2 teaspoons p.o. q.6h. p.r.n. pain.  3. PerioGard Chlorhexidine rinses t.i.d.   DISCHARGE INSTRUCTIONS:  Diet instructions to consist of a full liquid diet,  1-2 cans of Ensure was recommended daily as well.  Oral hygiene instructions  were also given.  Light physical activity was recommended.  He is to follow  up in two weeks in my office.  Please call [phone] for an  appointment.  He  was also given wire cutters in the event of airway emergency only.                                               Sable Feil., D.D.S.    JWB/MEDQ  D:  12/13/2003  T:  12/13/2003  Job:  [id number]

## 2010-12-22 ENCOUNTER — Emergency Department (HOSPITAL_COMMUNITY)
Admission: EM | Admit: 2010-12-22 | Discharge: 2010-12-22 | Disposition: A | Discharge status: Home / Self Care | Payer: Self-pay | Attending: Emergency Medicine | Admitting: Emergency Medicine

## 2010-12-22 DIAGNOSIS — Z79899 Other long term (current) drug therapy: Secondary | ICD-10-CM | POA: Insufficient documentation

## 2010-12-22 DIAGNOSIS — F329 Major depressive disorder, single episode, unspecified: Secondary | ICD-10-CM | POA: Insufficient documentation

## 2010-12-22 DIAGNOSIS — F3289 Other specified depressive episodes: Secondary | ICD-10-CM | POA: Insufficient documentation

## 2010-12-22 DIAGNOSIS — F411 Generalized anxiety disorder: Secondary | ICD-10-CM | POA: Insufficient documentation

## 2010-12-22 DIAGNOSIS — S0180XA Unspecified open wound of other part of head, initial encounter: Secondary | ICD-10-CM | POA: Insufficient documentation

## 2010-12-26 ENCOUNTER — Emergency Department (HOSPITAL_COMMUNITY): Payer: Self-pay

## 2010-12-26 ENCOUNTER — Emergency Department (HOSPITAL_COMMUNITY)
Admission: EM | Admit: 2010-12-26 | Discharge: 2010-12-27 | Disposition: A | Discharge status: Home / Self Care | Payer: Self-pay | Attending: Emergency Medicine | Admitting: Emergency Medicine

## 2010-12-26 DIAGNOSIS — M7989 Other specified soft tissue disorders: Secondary | ICD-10-CM | POA: Insufficient documentation

## 2010-12-26 DIAGNOSIS — M79609 Pain in unspecified limb: Secondary | ICD-10-CM | POA: Insufficient documentation

## 2010-12-26 DIAGNOSIS — F1010 Alcohol abuse, uncomplicated: Secondary | ICD-10-CM | POA: Insufficient documentation

## 2010-12-26 DIAGNOSIS — IMO0002 Reserved for concepts with insufficient information to code with codable children: Secondary | ICD-10-CM | POA: Insufficient documentation

## 2010-12-26 DIAGNOSIS — F341 Dysthymic disorder: Secondary | ICD-10-CM | POA: Insufficient documentation

## 2010-12-26 DIAGNOSIS — W2209XA Striking against other stationary object, initial encounter: Secondary | ICD-10-CM | POA: Insufficient documentation

## 2010-12-26 DIAGNOSIS — Z79899 Other long term (current) drug therapy: Secondary | ICD-10-CM | POA: Insufficient documentation

## 2010-12-26 LAB — POCT I-STAT, CHEM 8
BUN: 7 mg/dL (ref 6–23)
Chloride: 104 mEq/L (ref 96–112)
Glucose, Bld: 127 mg/dL — ABNORMAL HIGH (ref 70–99)
HCT: 47 % (ref 39.0–52.0)
Potassium: 3.5 mEq/L (ref 3.5–5.1)

## 2010-12-26 LAB — RAPID URINE DRUG SCREEN, HOSP PERFORMED
Amphetamines: NOT DETECTED
Benzodiazepines: NOT DETECTED
Cocaine: POSITIVE — AB
Opiates: NOT DETECTED
Tetrahydrocannabinol: NOT DETECTED

## 2010-12-26 LAB — DIFFERENTIAL
Eosinophils Relative: 1 % (ref 0–5)
Lymphocytes Relative: 42 % (ref 12–46)
Lymphs Abs: 1.4 10*3/uL (ref 0.7–4.0)
Monocytes Relative: 9 % (ref 3–12)
Neutrophils Relative %: 48 % (ref 43–77)

## 2010-12-26 LAB — URINALYSIS, ROUTINE W REFLEX MICROSCOPIC
Glucose, UA: NEGATIVE mg/dL
Hgb urine dipstick: NEGATIVE
Specific Gravity, Urine: 1.015 (ref 1.005–1.030)

## 2010-12-26 LAB — CBC
HCT: 39.8 % (ref 39.0–52.0)
MCH: 27.8 pg (ref 26.0–34.0)
MCV: 79.6 fL (ref 78.0–100.0)
RBC: 5 MIL/uL (ref 4.22–5.81)
WBC: 3.4 10*3/uL — ABNORMAL LOW (ref 4.0–10.5)

## 2011-01-01 ENCOUNTER — Emergency Department (HOSPITAL_COMMUNITY)
Admission: EM | Admit: 2011-01-01 | Discharge: 2011-01-01 | Disposition: A | Discharge status: Home / Self Care | Payer: Self-pay | Attending: Emergency Medicine | Admitting: Emergency Medicine

## 2011-01-01 ENCOUNTER — Emergency Department (HOSPITAL_COMMUNITY): Payer: Self-pay

## 2011-01-01 DIAGNOSIS — M79609 Pain in unspecified limb: Secondary | ICD-10-CM | POA: Insufficient documentation

## 2011-01-01 DIAGNOSIS — M7989 Other specified soft tissue disorders: Secondary | ICD-10-CM | POA: Insufficient documentation

## 2011-01-01 DIAGNOSIS — Z4802 Encounter for removal of sutures: Secondary | ICD-10-CM | POA: Insufficient documentation
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
## 2011-02-20 ENCOUNTER — Emergency Department (HOSPITAL_COMMUNITY)
Admission: EM | Admit: 2011-02-20 | Discharge: 2011-02-21 | Disposition: A | Discharge status: Home / Self Care | Payer: Self-pay | Attending: Emergency Medicine | Admitting: Emergency Medicine

## 2011-02-20 DIAGNOSIS — F1010 Alcohol abuse, uncomplicated: Secondary | ICD-10-CM | POA: Insufficient documentation

## 2011-02-20 DIAGNOSIS — IMO0002 Reserved for concepts with insufficient information to code with codable children: Secondary | ICD-10-CM | POA: Insufficient documentation

## 2011-02-20 DIAGNOSIS — R112 Nausea with vomiting, unspecified: Secondary | ICD-10-CM | POA: Insufficient documentation

## 2011-02-20 LAB — CBC
Hemoglobin: 12.6 g/dL — ABNORMAL LOW (ref 13.0–17.0)
MCH: 27.8 pg (ref 26.0–34.0)
MCHC: 34.1 g/dL (ref 30.0–36.0)

## 2011-02-20 LAB — BASIC METABOLIC PANEL
CO2: 27 mEq/L (ref 19–32)
Calcium: 8.6 mg/dL (ref 8.4–10.5)
Creatinine, Ser: 0.77 mg/dL (ref 0.50–1.35)

## 2011-02-20 LAB — RAPID URINE DRUG SCREEN, HOSP PERFORMED
Barbiturates: POSITIVE — AB
Opiates: NOT DETECTED
Tetrahydrocannabinol: POSITIVE — AB

## 2011-02-20 LAB — DIFFERENTIAL
Basophils Relative: 1 % (ref 0–1)
Eosinophils Absolute: 0 10*3/uL (ref 0.0–0.7)
Eosinophils Relative: 1 % (ref 0–5)
Monocytes Absolute: 0.3 10*3/uL (ref 0.1–1.0)
Monocytes Relative: 7 % (ref 3–12)
Neutro Abs: 2.5 10*3/uL (ref 1.7–7.7)

## 2011-02-20 LAB — ETHANOL: Alcohol, Ethyl (B): 408 mg/dL (ref 0–11)

## 2011-02-21 LAB — ETHANOL: Alcohol, Ethyl (B): 127 mg/dL — ABNORMAL HIGH (ref 0–11)

## 2011-02-23 ENCOUNTER — Emergency Department (HOSPITAL_COMMUNITY)
Admission: EM | Admit: 2011-02-23 | Discharge: 2011-02-24 | Disposition: A | Discharge status: Home / Self Care | Payer: Self-pay | Attending: Emergency Medicine | Admitting: Emergency Medicine

## 2011-02-23 ENCOUNTER — Emergency Department (HOSPITAL_COMMUNITY): Payer: Self-pay

## 2011-02-23 DIAGNOSIS — IMO0002 Reserved for concepts with insufficient information to code with codable children: Secondary | ICD-10-CM | POA: Insufficient documentation

## 2011-02-23 DIAGNOSIS — R4182 Altered mental status, unspecified: Secondary | ICD-10-CM | POA: Insufficient documentation

## 2011-02-23 DIAGNOSIS — F411 Generalized anxiety disorder: Secondary | ICD-10-CM | POA: Insufficient documentation

## 2011-02-23 DIAGNOSIS — I498 Other specified cardiac arrhythmias: Secondary | ICD-10-CM | POA: Insufficient documentation

## 2011-02-23 DIAGNOSIS — R51 Headache: Secondary | ICD-10-CM | POA: Insufficient documentation

## 2011-02-23 DIAGNOSIS — F1010 Alcohol abuse, uncomplicated: Secondary | ICD-10-CM | POA: Insufficient documentation

## 2011-02-23 LAB — COMPREHENSIVE METABOLIC PANEL
ALT: 24 U/L (ref 0–53)
AST: 34 U/L (ref 0–37)
Albumin: 3.7 g/dL (ref 3.5–5.2)
Calcium: 8.4 mg/dL (ref 8.4–10.5)
GFR calc Af Amer: >60 mL/min (ref >60)
Glucose, Bld: 106 mg/dL — ABNORMAL HIGH (ref 70–99)
Sodium: 142 mEq/L (ref 135–145)
Total Protein: 7.7 g/dL (ref 6.0–8.3)

## 2011-02-23 LAB — CBC
MCH: 27.9 pg (ref 26.0–34.0)
MCHC: 34.1 g/dL (ref 30.0–36.0)
Platelets: 320 10*3/uL (ref 150–400)
RDW: 16.7 % — ABNORMAL HIGH (ref 11.5–15.5)

## 2011-02-23 LAB — DIFFERENTIAL
Basophils Relative: 1 % (ref 0–1)
Eosinophils Absolute: 0 10*3/uL (ref 0.0–0.7)
Monocytes Absolute: 0.3 10*3/uL (ref 0.1–1.0)
Monocytes Relative: 9 % (ref 3–12)

## 2011-02-23 LAB — URINALYSIS, ROUTINE W REFLEX MICROSCOPIC
Ketones, ur: NEGATIVE mg/dL
Leukocytes, UA: NEGATIVE
Nitrite: NEGATIVE
Protein, ur: NEGATIVE mg/dL
Urobilinogen, UA: 0.2 mg/dL (ref 0.0–1.0)
pH: 6 (ref 5.0–8.0)

## 2011-02-23 LAB — PROTIME-INR: INR: 1.09 (ref 0.00–1.49)

## 2011-02-25 ENCOUNTER — Emergency Department (HOSPITAL_COMMUNITY)
Admission: EM | Admit: 2011-02-25 | Discharge: 2011-02-26 | Disposition: A | Discharge status: Home / Self Care | Payer: Self-pay | Attending: Emergency Medicine | Admitting: Emergency Medicine

## 2011-02-25 DIAGNOSIS — F1010 Alcohol abuse, uncomplicated: Secondary | ICD-10-CM | POA: Insufficient documentation

## 2011-04-19 ENCOUNTER — Emergency Department (HOSPITAL_COMMUNITY)
Admission: EM | Admit: 2011-04-19 | Discharge: 2011-04-20 | Disposition: A | Discharge status: Home / Self Care | Payer: Self-pay | Attending: Emergency Medicine | Admitting: Emergency Medicine

## 2011-04-19 ENCOUNTER — Emergency Department (HOSPITAL_COMMUNITY)
Admission: EM | Admit: 2011-04-19 | Discharge: 2011-04-19 | Discharge status: Against Medical Advice | Payer: Self-pay | Attending: Emergency Medicine | Admitting: Emergency Medicine

## 2011-04-19 ENCOUNTER — Encounter: Payer: Self-pay | Admitting: *Deleted

## 2011-04-19 DIAGNOSIS — F17200 Nicotine dependence, unspecified, uncomplicated: Secondary | ICD-10-CM | POA: Insufficient documentation

## 2011-04-19 DIAGNOSIS — K6289 Other specified diseases of anus and rectum: Secondary | ICD-10-CM | POA: Insufficient documentation

## 2011-04-19 DIAGNOSIS — R109 Unspecified abdominal pain: Secondary | ICD-10-CM | POA: Insufficient documentation

## 2011-04-19 DIAGNOSIS — K625 Hemorrhage of anus and rectum: Secondary | ICD-10-CM | POA: Insufficient documentation

## 2011-04-19 DIAGNOSIS — R198 Other specified symptoms and signs involving the digestive system and abdomen: Secondary | ICD-10-CM | POA: Insufficient documentation

## 2011-04-19 LAB — CBC
HCT: 42.1 % (ref 39.0–52.0)
RDW: 16.4 % — ABNORMAL HIGH (ref 11.5–15.5)
WBC: 4.4 10*3/uL (ref 4.0–10.5)

## 2011-04-19 MED ORDER — TRAMADOL HCL 50 MG PO TABS
ORAL_TABLET | ORAL | Status: AC
  Administered 2011-04-19: 23:00:00
  Filled 2011-04-19: qty 1

## 2011-04-19 MED ORDER — VITAMIN B-1 100 MG PO TABS
100.0000 mg | ORAL_TABLET | Freq: Once | ORAL | Status: AC
  Administered 2011-04-19: 100 mg via ORAL
  Filled 2011-04-19: qty 1

## 2011-04-19 MED ORDER — TRAMADOL HCL 50 MG PO TABS
50.0000 mg | ORAL_TABLET | Freq: Once | ORAL | Status: AC
  Administered 2011-04-19: 50 mg via ORAL

## 2011-04-19 MED ORDER — VITAMIN B-1 100 MG PO TABS
ORAL_TABLET | ORAL | Status: AC
  Filled 2011-04-19: qty 1

## 2011-04-19 MED ORDER — ONDANSETRON 8 MG PO TBDP
8.0000 mg | ORAL_TABLET | Freq: Once | ORAL | Status: AC
  Administered 2011-04-20: 8 mg via ORAL

## 2011-04-19 NOTE — ED Provider Notes (Signed)
History     CSN: [id number] Arrival date & time: 04/19/2011  8:07 PM   First MD Initiated Contact with Patient 04/19/11 2132      No chief complaint on file.   (Consider location/radiation/quality/duration/timing/severity/associated sxs/prior treatment) HPI This 40 year old male presents with concerns over ongoing rectal bleeding and pain. He notes that his symptoms have been there for "years", but over the past few days he has had a increase in the amount of red blood on his underwear. The pain is rectal, minimally suprapubic, and described as sharp. No attempts at radiation, no visits to his primary care physician. Pain is worse with defecation. No endorsement of rectal foreign bodies or sex.  No fever, no chills, no emesis, no diarrhea. The patient presents today, because his significant other urged him to be evaluated.  History reviewed. No pertinent past medical history.  History reviewed. No pertinent past surgical history.  History reviewed. No pertinent family history.  History  Substance Use Topics  . Smoking status: Current Everyday Smoker  . Smokeless tobacco: Not on file  . Alcohol Use: Yes      Review of Systems  All other systems reviewed and are negative.    Allergies  Review of patient's allergies indicates no known allergies.  Home Medications  No current outpatient prescriptions on file.  BP 139/93  Pulse 99  Temp(Src) 98.6 F (37 C) (Oral)  Resp 18  SpO2 95%  Physical Exam  Constitutional: He is oriented to person, place, and time. He appears well-developed and well-nourished.  HENT:  Head: Normocephalic and atraumatic.  Eyes: EOM are normal. Pupils are equal, round, and reactive to light.  Cardiovascular: Intact distal pulses.   Pulmonary/Chest: Effort normal.  Abdominal: Soft. He exhibits no mass. There is no tenderness. There is no guarding.       The external rectum is unremarkable, with no notable hemorrhoids. The patient was resistant  to digital rectal exam, and he deferred the exam in its entirety.  Genitourinary: Penis normal.  Musculoskeletal: He exhibits no edema and no tenderness.  Neurological: He is alert and oriented to person, place, and time.  Skin: Skin is warm and dry.    ED Course  Procedures (including critical care time)   Labs Reviewed  CBC   No results found.   No diagnosis found.    MDM  This generally well-appearing 40 year old male presents with concerns of ongoing rectal bleeding. The patient's history of bleeding for years, and the absence of any concurrent chest pain, dyspnea, syncope, notable abdominal pain, by mouth intolerance, diarrhea, there is low suspicion for acute pathology. Physical exam is limited due to the patient's deferral of a rectal exam. However hemoglobin is 14.8, reassuring for the safety of the patient as he pursues further outpatient evaluation.        Gerhard Munch, MD 04/19/11 [phone]

## 2011-04-19 NOTE — ED Notes (Signed)
EMS reports pt has had a rectal bleed for a month, condition unchanged, reports some abd pain, no other symptoms, has not been seen for complaints

## 2011-04-20 MED ORDER — ONDANSETRON 8 MG PO TBDP
ORAL_TABLET | ORAL | Status: AC
  Filled 2011-04-20: qty 1

## 2011-04-20 NOTE — ED Notes (Signed)
Patient is resting comfortably. 

## 2011-05-14 ENCOUNTER — Emergency Department (HOSPITAL_COMMUNITY)
Admission: EM | Admit: 2011-05-14 | Discharge: 2011-05-15 | Disposition: A | Discharge status: Home / Self Care | Payer: Self-pay | Attending: Emergency Medicine | Admitting: Emergency Medicine

## 2011-05-14 ENCOUNTER — Encounter (HOSPITAL_COMMUNITY): Payer: Self-pay | Admitting: *Deleted

## 2011-05-14 DIAGNOSIS — F1020 Alcohol dependence, uncomplicated: Secondary | ICD-10-CM | POA: Insufficient documentation

## 2011-05-14 DIAGNOSIS — K625 Hemorrhage of anus and rectum: Secondary | ICD-10-CM | POA: Insufficient documentation

## 2011-05-14 DIAGNOSIS — K219 Gastro-esophageal reflux disease without esophagitis: Secondary | ICD-10-CM | POA: Insufficient documentation

## 2011-05-14 HISTORY — DX: Major depressive disorder, single episode, unspecified: F32.9

## 2011-05-14 HISTORY — DX: Alcohol abuse, uncomplicated: F10.10

## 2011-05-14 HISTORY — DX: Gastro-esophageal reflux disease without esophagitis: K21.9

## 2011-05-14 HISTORY — DX: Anxiety disorder, unspecified: F41.9

## 2011-05-14 HISTORY — DX: Depression, unspecified: F32.A

## 2011-05-14 LAB — COMPREHENSIVE METABOLIC PANEL
ALT: 231 U/L — ABNORMAL HIGH (ref 0–53)
AST: 412 U/L — ABNORMAL HIGH (ref 0–37)
Albumin: 2.5 g/dL — ABNORMAL LOW (ref 3.5–5.2)
Alkaline Phosphatase: 175 U/L — ABNORMAL HIGH (ref 39–117)
BUN: 14 mg/dL (ref 6–23)
CO2: 27 mEq/L (ref 19–32)
Calcium: 7.7 mg/dL — ABNORMAL LOW (ref 8.4–10.5)
Chloride: 98 mEq/L (ref 96–112)
Creatinine, Ser: 1.2 mg/dL (ref 0.50–1.35)
GFR calc Af Amer: 85 mL/min — ABNORMAL LOW (ref >90)
GFR calc non Af Amer: 73 mL/min — ABNORMAL LOW (ref >90)
Glucose, Bld: 130 mg/dL — ABNORMAL HIGH (ref 70–99)
Potassium: 3.2 mEq/L — ABNORMAL LOW (ref 3.5–5.1)
Sodium: 134 mEq/L — ABNORMAL LOW (ref 135–145)
Total Bilirubin: 0.9 mg/dL (ref 0.3–1.2)
Total Protein: 6.2 g/dL (ref 6.0–8.3)

## 2011-05-14 LAB — CBC
HCT: 38.4 % — ABNORMAL LOW (ref 39.0–52.0)
Hemoglobin: 13.4 g/dL (ref 13.0–17.0)
MCH: 29.8 pg (ref 26.0–34.0)
MCH: 30.7 pg (ref 26.0–34.0)
MCHC: 34.9 g/dL (ref 30.0–36.0)
MCV: 85.3 fL (ref 78.0–100.0)
MCV: 84.9 fL (ref 78.0–100.0)
Platelets: 168 10*3/uL (ref 150–400)
Platelets: 165 10*3/uL (ref 150–400)
RBC: 4.5 MIL/uL (ref 4.22–5.81)
RDW: 15.8 % — ABNORMAL HIGH (ref 11.5–15.5)
RDW: 16.2 % — ABNORMAL HIGH (ref 11.5–15.5)
WBC: 2.5 10*3/uL — ABNORMAL LOW (ref 4.0–10.5)
WBC: 2.2 10*3/uL — ABNORMAL LOW (ref 4.0–10.5)

## 2011-05-14 LAB — RAPID URINE DRUG SCREEN, HOSP PERFORMED
Amphetamines: NOT DETECTED
Barbiturates: NOT DETECTED
Benzodiazepines: NOT DETECTED
Cocaine: NOT DETECTED
Opiates: NOT DETECTED
Tetrahydrocannabinol: POSITIVE — AB

## 2011-05-14 LAB — DIFFERENTIAL
Basophils Absolute: 0 10*3/uL (ref 0.0–0.1)
Lymphs Abs: 0.9 10*3/uL (ref 0.7–4.0)
Monocytes Absolute: 0.2 10*3/uL (ref 0.1–1.0)
Neutrophils Relative %: 45 % (ref 43–77)

## 2011-05-14 LAB — ETHANOL: Alcohol, Ethyl (B): 353 mg/dL — ABNORMAL HIGH (ref 0–11)

## 2011-05-14 MED ORDER — ZOLPIDEM TARTRATE 5 MG PO TABS: 5.0000 mg | ORAL_TABLET | Freq: Every evening | ORAL | Status: DC | PRN

## 2011-05-14 MED ORDER — LORAZEPAM 1 MG PO TABS
1.0000 mg | ORAL_TABLET | Freq: Three times a day (TID) | ORAL | Status: DC | PRN
  Administered 2011-05-14 – 2011-05-15 (×2): 1 mg via ORAL
  Filled 2011-05-14 (×2): qty 1

## 2011-05-14 MED ORDER — ONDANSETRON HCL 4 MG PO TABS
4.0000 mg | ORAL_TABLET | Freq: Three times a day (TID) | ORAL | Status: DC | PRN
  Administered 2011-05-14 – 2011-05-15 (×2): 4 mg via ORAL
  Filled 2011-05-14 (×2): qty 1

## 2011-05-14 MED ORDER — ALUM & MAG HYDROXIDE-SIMETH 200-200-20 MG/5ML PO SUSP: 30.0000 mL | ORAL | Status: DC | PRN

## 2011-05-14 MED ORDER — IBUPROFEN 600 MG PO TABS: 600.0000 mg | ORAL_TABLET | Freq: Three times a day (TID) | ORAL | Status: DC | PRN

## 2011-05-14 MED ORDER — ACETAMINOPHEN 325 MG PO TABS: 650.0000 mg | ORAL_TABLET | ORAL | Status: DC | PRN
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
## 2011-05-14 NOTE — ED Notes (Signed)
Two patient belonging bags and a black book bag locked in activity room.

## 2011-05-14 NOTE — ED Notes (Signed)
Pt states "I want detox, I been drinking mouthwash, I'm bleeding from my butt"

## 2011-05-14 NOTE — ED Provider Notes (Signed)
History     CSN: [id number] Arrival date & time: 05/14/2011 11:18 AM   First MD Initiated Contact with Patient 05/14/11 1313      Chief Complaint  Patient presents with  . Medical Clearance  . Rectal Bleeding    (Consider location/radiation/quality/duration/timing/severity/associated sxs/prior treatment) HPI Patient is here for evaluation for alcohol detox.  Patient states, that he is a chronic bleeding from his rectal area.  This problem has been ongoing for a long period of time.  There is no signs today, that he has large amounts of hemorrhage or bleeding.  States he notices a small amount of bright red blood when he uses the bathroom.  Patient states, that he does use marijuana, but no other drugs.  The patient, states, that he drinks constantly. Past Medical History  Diagnosis Date  . Alcohol abuse   . Anxiety   . Depression   . Acid reflux     Past Surgical History  Procedure Date  . Left leg surgery   . Facial reconstruction surgery     d/t facial fx's    No family history on file.  History  Substance Use Topics  . Smoking status: Current Everyday Smoker -- 0.5 packs/day  . Smokeless tobacco: Not on file  . Alcohol Use: Yes     drink wine constantly all day      Review of Systems All pertinent positives and negatives in the history of present illness  Allergies  Review of patient's allergies indicates no known allergies.  Home Medications   Current Outpatient Rx  Name Route Sig Dispense Refill  . CITALOPRAM HYDROBROMIDE 20 MG PO TABS Oral Take 20 mg by mouth daily.      [name] OMEPRAZOLE 20 MG PO CPDR Oral Take 20 mg by mouth daily. Heart burn     . TRAZODONE HCL 150 MG PO TABS Oral Take 150 mg by mouth at bedtime.        BP 129/88  Pulse 114  Temp(Src) 98.5 F (36.9 C) (Oral)  Resp 20  Wt 130 lb (58.968 kg)  SpO2 98%  Physical Exam  Constitutional: He is oriented to person, place, and time. He appears well-developed and well-nourished. No  distress.  HENT:  Head: Normocephalic and atraumatic.  Eyes: Pupils are equal, round, and reactive to light.  Cardiovascular: Normal rate, regular rhythm and normal heart sounds.   Pulmonary/Chest: Effort normal and breath sounds normal.  Abdominal: Soft. Bowel sounds are normal. He exhibits no distension and no mass. There is no tenderness. There is no rebound and no guarding.  Musculoskeletal: Normal range of motion.  Neurological: He is alert and oriented to person, place, and time.  Skin: Skin is warm and dry. No rash noted.  Psychiatric: He has a normal mood and affect. His behavior is normal. Judgment and thought content normal.    ED Course  Procedures (including critical care time)  Labs Reviewed  ETHANOL - Abnormal; Notable for the following:    Alcohol, Ethyl (B) 353 (*)    All other components within normal limits  URINE RAPID DRUG SCREEN (HOSP PERFORMED) - Abnormal; Notable for the following:    Tetrahydrocannabinol POSITIVE (*)    All other components within normal limits  COMPREHENSIVE METABOLIC PANEL - Abnormal; Notable for the following:    Sodium 134 (*)    Potassium 3.2 (*)    Glucose, Bld 130 (*)    Calcium 7.7 (*)    Albumin 2.5 (*)    AST  412 (*)    ALT 231 (*)    Alkaline Phosphatase 175 (*)    GFR calc non Af Amer 73 (*)    GFR calc Af Amer 85 (*)    All other components within normal limits  CBC - Abnormal; Notable for the following:    WBC 2.5 (*)    HCT 38.4 (*)    RDW 15.8 (*)    All other components within normal limits   No results found.   No diagnosis found. I spoke with the ACT team and they will evaluate the patient.  Patient is stable at this time.  The patient does have elevation of his liver enzymes time.   MDM          Carlyle Dolly, PA 05/14/11 [phone]

## 2011-05-14 NOTE — ED Notes (Signed)
Dr. Anitra Lauth & Thayer Ohm, PA-C informed of pt status

## 2011-05-14 NOTE — ED Notes (Signed)
Pt information has been sent to [facility] for possible disposition. Oncoming CSW/ACT will need to follow up with [facility] for disposition and authorization number if accepted.

## 2011-05-14 NOTE — ED Notes (Signed)
Pt information has been sent to [facility] for review. Pt will need to have a BAL under 200 to transfer to [facility] if accepted. Pt is currently pending disposition.

## 2011-05-14 NOTE — ED Provider Notes (Signed)
Medical screening examination/treatment/procedure(s) were performed by non-physician practitioner and as supervising physician I was immediately available for consultation/collaboration.   Gwyneth Sprout, MD 05/14/11 [phone]
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
## 2011-05-15 LAB — ETHANOL: Alcohol, Ethyl (B): 105 mg/dL — ABNORMAL HIGH (ref 0–11)

## 2011-05-15 NOTE — ED Provider Notes (Signed)
Pt being discharged, to go to [facility] for alcohol detox treatment.    Carleene Cooper III, MD 05/15/11 [phone]

## 2011-05-15 NOTE — Discharge Planning (Signed)
Patient has been accepted to [facility] detox program. Authorization number is [id number]. This Clinical research associate spoke with Okey Regal at [facility] who confirmed patient's acceptance. CSW Department has purchased train ticket for patient to [place] where he will be picked up by [facility]. Carol at [facility] also informed of patient's arrival time.  Patient's nurse notified of disposition.  Ileene Hutchinson , MSW, LCSWA 05/15/2011 8:58 AM

## 2011-05-21 ENCOUNTER — Encounter (HOSPITAL_COMMUNITY): Payer: Self-pay | Admitting: *Deleted

## 2011-07-09 ENCOUNTER — Encounter (HOSPITAL_COMMUNITY): Payer: Self-pay | Admitting: Emergency Medicine

## 2011-07-09 ENCOUNTER — Emergency Department (HOSPITAL_COMMUNITY)
Admission: EM | Admit: 2011-07-09 | Discharge: 2011-07-10 | Disposition: A | Discharge status: Home / Self Care | Payer: Self-pay | Attending: Emergency Medicine | Admitting: Emergency Medicine

## 2011-07-09 DIAGNOSIS — F1010 Alcohol abuse, uncomplicated: Secondary | ICD-10-CM | POA: Insufficient documentation

## 2011-07-09 DIAGNOSIS — F10929 Alcohol use, unspecified with intoxication, unspecified: Secondary | ICD-10-CM

## 2011-07-09 LAB — COMPREHENSIVE METABOLIC PANEL
ALT: 33 U/L (ref 0–53)
AST: 58 U/L — ABNORMAL HIGH (ref 0–37)
Albumin: 3.7 g/dL (ref 3.5–5.2)
Alkaline Phosphatase: 133 U/L — ABNORMAL HIGH (ref 39–117)
Potassium: 2.9 mEq/L — ABNORMAL LOW (ref 3.5–5.1)
Sodium: 137 mEq/L (ref 135–145)
Total Protein: 8.1 g/dL (ref 6.0–8.3)

## 2011-07-09 LAB — CBC
MCHC: 34.8 g/dL (ref 30.0–36.0)
Platelets: 313 10*3/uL (ref 150–400)
RDW: 13.1 % (ref 11.5–15.5)
WBC: 2.6 10*3/uL — ABNORMAL LOW (ref 4.0–10.5)

## 2011-07-09 LAB — ETHANOL: Alcohol, Ethyl (B): 449 mg/dL (ref 0–11)

## 2011-07-09 LAB — RAPID URINE DRUG SCREEN, HOSP PERFORMED
Amphetamines: NOT DETECTED
Barbiturates: NOT DETECTED
Benzodiazepines: NOT DETECTED
Tetrahydrocannabinol: NOT DETECTED

## 2011-07-09 MED ORDER — POTASSIUM CHLORIDE CRYS ER 20 MEQ PO TBCR
40.0000 meq | EXTENDED_RELEASE_TABLET | Freq: Once | ORAL | Status: AC
  Administered 2011-07-10: 40 meq via ORAL
  Filled 2011-07-09: qty 2

## 2011-07-09 NOTE — ED Notes (Signed)
Pt alert, presents via EMS via [facility], +ETOH, requesting detox, pt denies SI/HI, resp even unlabored, skin pwd

## 2011-07-09 NOTE — ED Notes (Signed)
Bed:[facility]<BR> Expected date:07/09/11<BR> Expected time: 7:15 PM<BR> Means of arrival:Ambulance<BR> Comments:<BR> EMS [facility] - detox

## 2011-07-09 NOTE — ED Notes (Signed)
Security bedside to search belongings, pt remains on unit, cont to monitor, awaiting eval by ALP

## 2011-07-09 NOTE — ED Notes (Signed)
Pt provided snack, remains on unit, denies other needs, cont to monitor

## 2011-07-09 NOTE — ED Notes (Signed)
ALP bedside to speak with pt 

## 2011-07-09 NOTE — ED Notes (Signed)
Lab called to inform of abnormal ETOH labs 449, pt RN notified, EDP/PA notified

## 2011-07-09 NOTE — ED Provider Notes (Signed)
History     CSN: [id number]  Arrival date & time 07/09/11  1918   First MD Initiated Contact with Patient 07/09/11 2152      Chief Complaint  Patient presents with  . Drug / Alcohol Assessment  . Alcohol Problem    (Consider location/radiation/quality/duration/timing/severity/associated sxs/prior treatment) HPI Comments: Patient is a chronic alcoholic tonight, stating he drinks alcohol on a daily basis, plus uses crack cocaine whenever he can get his hands on it, says he's been contemplating suicide for a long time has no specific plan  Patient is a 41 y.o. male presenting with drug/alcohol assessment and alcohol problem. The history is provided by the patient.  Drug / Alcohol Assessment Primary symptoms include no confusion. This is a chronic problem. Suspected agents include alcohol and cocaine. Pertinent negatives include no fever.  Alcohol Problem Pertinent negatives include no coughing or fever.    Past Medical History  Diagnosis Date  . Alcohol abuse   . Anxiety   . Depression   . Acid reflux     Past Surgical History  Procedure Date  . Alcohol abuse   . Left leg surgery   . Facial reconstruction surgery     d/t facial fx's    No family history on file.  History  Substance Use Topics  . Smoking status: Current Everyday Smoker -- 0.5 packs/day  . Smokeless tobacco: Not on file  . Alcohol Use: Yes     drink wine constantly all day      Review of Systems  Constitutional: Negative for fever.  HENT: Negative for rhinorrhea.   Respiratory: Negative for cough.   Psychiatric/Behavioral: Positive for suicidal ideas. Negative for confusion.    Allergies  Review of patient's allergies indicates no known allergies.  Home Medications   Current Outpatient Rx  Name Route Sig Dispense Refill  . CITALOPRAM HYDROBROMIDE 20 MG PO TABS Oral Take 20 mg by mouth daily.      [name] OMEPRAZOLE 20 MG PO CPDR Oral Take 20 mg by mouth daily. Heart burn     . TRAZODONE HCL  150 MG PO TABS Oral Take 150 mg by mouth at bedtime.        There were no vitals taken for this visit.  Physical Exam  ED Course  Procedures (including critical care time)  Labs Reviewed  CBC - Abnormal; Notable for the following:    WBC 2.6 (*)    RBC 4.07 (*)    Hemoglobin 12.4 (*)    HCT 35.6 (*)    All other components within normal limits  COMPREHENSIVE METABOLIC PANEL - Abnormal; Notable for the following:    Potassium 2.9 (*)    BUN 4 (*)    AST 58 (*)    Alkaline Phosphatase 133 (*)    All other components within normal limits  ETHANOL - Abnormal; Notable for the following:    Alcohol, Ethyl (B) 449 (*)    All other components within normal limits  URINE RAPID DRUG SCREEN (HOSP PERFORMED)   No results found.   1. Alcohol intoxication     ED ECG REPORT   Date: 07/10/2011  EKG Time: 5:19 AM  Rate: 89  Rhythm: normal sinus rhythm,  normal EKG, normal sinus rhythm, unchanged from previous tracings  Axis: right  Intervals:none  ST&T Change: borderline prolonged QT interval   Narrative Interpretation: borderlin     Patient walk about one hour ago requesting a meal, stating he was ready to go home.  His son is his son came up questioned about detox and, states he's not interested is not suicidal is not thinking about suicide at this point.       MDM  Alcohol intoxication        Arman Filter, NP 07/10/11 0516  Arman Filter, NP 07/10/11 [phone]

## 2011-07-10 ENCOUNTER — Other Ambulatory Visit: Payer: Self-pay

## 2011-07-10 LAB — ETHANOL: Alcohol, Ethyl (B): 173 mg/dL — ABNORMAL HIGH (ref 0–11)

## 2011-07-10 MED ORDER — FOLIC ACID 1 MG PO TABS
1.0000 mg | ORAL_TABLET | Freq: Once | ORAL | Status: AC
  Administered 2011-07-10: 1 mg via ORAL
  Filled 2011-07-10: qty 1

## 2011-07-10 MED ORDER — VITAMIN B-1 100 MG PO TABS
50.0000 mg | ORAL_TABLET | Freq: Once | ORAL | Status: AC
  Administered 2011-07-10: 50 mg via ORAL
  Filled 2011-07-10: qty 1

## 2011-07-10 NOTE — ED Provider Notes (Signed)
Medical screening examination/treatment/procedure(s) were conducted as a shared visit with non-physician practitioner(s) and myself.  I personally evaluated the patient during the encounter  Patient arrived with significant alcohol intoxication, has frequent visits for the same complaint and also uses other drugs such as cocaine or crack. When he is intoxicated and high he struggles with suicidal thoughts. However as is the case historically when he sobers up the symptoms of suicide go away and he feels back to his baseline.  Physical exam:  Initial intoxicated however with time he is cleared and his mental status is back to baseline, abdomen is soft, lungs are clear, gait is normal, speech is clear and he has tolerated food and fluids by mouth.  Assessment:  Chronic alcohol abuse, patient is now well appearing and does not have any suicidal thoughts at this time. He is requesting discharge to that he can make it to court to a court appointment. At this time I feel he is safe for discharge  Kevin Roller, MD 07/10/11 [phone]

## 2011-07-10 NOTE — Discharge Instructions (Signed)
Finding Treatment for Alcohol and Drug Addiction It can be hard to find the right place to get professional treatment. Here are some important things to consider:  There are different types of treatment to choose from.   Some programs are live-in (residential) while others are not (outpatient). Sometimes a combination is offered.   No single type of program is right for everyone.   Most treatment programs involve a combination of education, counseling, and a 12-step, spiritually-based approach.   There are non-spiritually based programs (not 12-step).   Some treatment programs are government sponsored. They are geared for patients without private insurance.   Treatment programs can vary in many respects such as:   Cost and types of insurance accepted.   Types of on-site medical services offered.   Length of stay, setting, and size.   Overall philosophy of treatment.  A person may need specialized treatment or have needs not addressed by all programs. For example, adolescents need treatment appropriate for their age. Other people have secondary disorders that must be managed as well. Secondary conditions can include mental illness, such as depression or diabetes. Often, a period of detoxification from alcohol or drugs is needed. This requires medical supervision and not all programs offer this. THINGS TO CONSIDER WHEN SELECTING A TREATMENT PROGRAM   Is the program certified by the appropriate government agency? Even private programs must be certified and employ certified professionals.   Does the program accept your insurance? If not, can a payment plan be set up?   Is the facility clean, organized, and well run? Do they allow you to speak with graduates who can share their treatment experience with you? Can you tour the facility? Can you meet with staff?   Does the program meet the full range of individual needs?   Does the treatment program address sexual orientation and physical  disabilities? Do they provide age, gender, and culturally appropriate treatment services?   Is treatment available in languages other than English?   Is long-term aftercare support or guidance encouraged and provided?   Is assessment of an individual's treatment plan ongoing to ensure it meets changing needs?   Does the program use strategies to encourage reluctant patients to remain in treatment long enough to increase the likelihood of success?   Does the program offer counseling (individual or group) and other behavioral therapies?   Does the program offer medicine as part of the treatment regimen, if needed?   Is there ongoing monitoring of possible relapse? Is there a defined relapse prevention program? Are services or referrals offered to family members to ensure they understand addiction and the recovery process? This would help them support the recovering individual.   Are 12-step meetings held at the center or is transport available for patients to attend outside meetings?  In countries outside of the [place]. and [place], Magazine features editor for contact information for services in your area. Document Released: 04/30/2005 Document Revised: 02/11/2011 Document Reviewed: 11/10/2007 [employer] Patient InformatiAlcohol and Nutrition Nutrition serves two purposes. It provides energy. It also maintains body structure and function. Food supplies energy. It also provides the building blocks needed to replace worn or damaged cells. Alcoholics often eat poorly. This limits their supply of essential nutrients. This affects energy supply and structure maintenance. Alcohol also affects the body's nutrients in:  Digestion.   Storage.   Using and getting rid of waste products.  IMPAIRMENT OF NUTRIENT DIGESTION AND UTILIZATION   Once ingested, food must be broken down into  small components (digested). Then it is available for energy. It helps maintain body structure and function. Digestion begins  in the mouth. It continues in the stomach and intestines, with help from the pancreas. The nutrients from digested food are absorbed from the intestines into the blood. Then they are carried to the liver. The liver prepares nutrients for:   Immediate use.   Storage and future use.   Alcohol inhibits the breakdown of nutrients into usable molecules.   It decreases secretion of digestive enzymes from the pancreas.   Alcohol impairs nutrient absorption by damaging the cells lining the stomach and intestines.   It also interferes with moving some nutrients into the blood.   In addition, nutritional deficiencies themselves may lead to further absorption problems.   For example, folate deficiency changes the cells that line the small intestine. This impairs how water is absorbed. It also affects absorbed nutrients. These include glucose, sodium, and additional folate.   Even if nutrients are digested and absorbed, alcohol can prevent them from being fully used. It changes their transport, storage, and excretion. Impaired utilization of nutrients by alcoholics is indicated by:   Decreased liver stores of vitamins, such as vitamin A.   Increased excretion of nutrients such as fat.  ALCOHOL AND ENERGY SUPPLY   Three basic nutritional components found in food are:   Carbohydrates.   Proteins.   Fats.   These are used as energy. Some alcoholics take in as much as 50% of their total daily calories from alcohol. They often neglect important foods.   Even when enough food is eaten, alcohol can impair the ways the body controls blood sugar (glucose) levels. It may either increase or decrease blood sugar.   In non-diabetic alcoholics, increased blood sugar (hyperglycemia) is caused by poor insulin secretion. It is usually temporary.   Decreased blood sugar (hypoglycemia) can cause serious injury even if this condition is short-lived. Low blood sugar can happen when a fasting or malnourished  person drinks alcohol. When there is no food to supply energy, stored sugar is used up. The products of alcohol inhibit forming glucose from other compounds such as amino acids. As a result, alcohol causes the brain and other body tissue to lack glucose. It is needed for energy and function.   Alcohol is an energy source. But how the body processes and uses the energy from alcohol is complex. Also, when alcohol is substituted for carbohydrates, subjects tend to lose weight. This indicates that they get less energy from alcohol than from food.  ALCOHOL - MAINTAINING CELL STRUCTURE AND FUNCTION  Structure Cells are made mostly of protein. So an adequate protein diet is important for maintaining cell structure. This is especially true if cells are being damaged. Research indicates that alcohol affects protein nutrition by causing impaired:  Digestion of proteins to amino acids.   Processing of amino acids by the small intestine and liver.   Synthesis of proteins from amino acids.   Protein secretion by the liver.  Function Nutrients are essential for the body to function well. They provide the tools that the body needs to work well:   Proteins.   Vitamins.   Minerals.  Alcohol can disrupt body function. It may cause nutrient deficiencies. And it may interfere with the way nutrients are processed. Vitamins  Vitamins are essential to maintain growth and normal metabolism. They regulate many of the body`s processes. Chronic heavy drinking causes deficiencies in many vitamins. This is caused by eating less.  And, in some cases, vitamins may be poorly absorbed. For example, alcohol inhibits fat absorption. It impairs how the vitamins A, E, and D are normally absorbed along with dietary fats. Not enough vitamin A may cause night blindness. Not enough vitamin D may cause softening of the bones.   Some alcoholics lack vitamins A, C, D, E, K, and the B vitamins. These are all involved in wound healing  and cell maintenance. In particular, because vitamin K is necessary for blood clotting, lacking that vitamin can cause delayed clotting. The result is excess bleeding. Lacking other vitamins involved in brain function may cause severe neurological damage.  Minerals Deficiencies of minerals such as calcium, magnesium, iron, and zinc are common in alcoholics. The alcohol itself does not seem to affect how these minerals are absorbed. Rather, they seem to occur secondary to other alcohol-related problems, such as:  Less calcium absorbed.   Not enough magnesium.   More urinary excretion.   Vomiting.   Diarrhea.   Not enough iron due to gastrointestinal bleeding.   Not enough zinc or losses related to other nutrient deficiencies.   Mineral deficiencies can cause a variety of medical consequences. These range from calcium-related bone disease to zinc-related night blindness and skin lesions.  ALCOHOL, MALNUTRITION, AND MEDICAL COMPLICATIONS  Liver Disease   Alcoholic liver damage is caused primarily by alcohol itself. But poor nutrition may increase the risk of alcohol-related liver damage. For example, nutrients normally found in the liver are known to be affected by drinking alcohol. These include carotenoids, which are the major sources of vitamin A, and vitamin E compounds. Decreases in such nutrients may play some role in alcohol-related liver damage.  Pancreatitis  Research suggests that malnutrition may increase the risk of developing alcoholic pancreatitis. Research suggests that a diet lacking in protein may increase alcohol's damaging effect on the pancreas.  Brain  Nutritional deficiencies may have severe effects on brain function. These may be permanent. Specifically, thiamine deficiencies are often seen in alcoholics. They can cause severe neurological problems. These include:   Impaired movement.   Memory loss seen in Wernicke-Korsakoff syndrome.  Pregnancy  Alcohol has  toxic effects on fetal development. It causes alcohol-related birth defects. They include fetal alcohol syndrome. Alcohol itself is toxic to the fetus. Also, the nutritional deficiency can affect how the fetus develops. That may compound the risk of developmental damage.   Nutritional needs during pregnancy are 10% to 30% greater than normal. Food intake can increase by as much as 140% to cover the needs of both mother and fetus. An alcoholic mother`s nutritional problems may adversely affect the nutrition of the fetus. And alcohol itself can also restrict nutrition flow to the fetus.  NUTRITIONAL STATUS OF ALCOHOLICS  Techniques for assessing nutritional status include:  Taking body measurements to estimate fat reserves. They include:   Weight.   Height.   Mass.   Skin fold thickness.   Performing blood analysis to provide measurements of circulating:   Proteins.   Vitamins.   Minerals.   These techniques tend to be imprecise. For many nutrients, there is no clear "cut-off" point that would allow an accurate definition of deficiency. So assessing the nutritional status of alcoholics is limited by these techniques. Dietary status may provide information about the risk of developing nutritional problems. Dietary status is assessed by:   Taking patients' dietary histories.   Evaluating the amount and types of food they are eating.   It is difficult to determine what  exact amount of alcohol begins to have damaging effects on nutrition. In general, moderate drinkers have 2 drinks or less per day. They seem to be at little risk for nutritional problems. Various medical disorders begin to appear at greater levels.   Research indicates that the majority of even the heaviest drinkers have few obvious nutritional deficiencies. Many alcoholics who are hospitalized for medical complications of their disease do have severe malnutrition. Alcoholics tend to eat poorly. Often they eat less than the  amounts of food necessary to provide enough:   Carbohydrates.   Protein.   Fat.   Vitamins A and C.   B vitamins.   Minerals like calcium and iron.  Of major concern is alcohol's effect on digesting food and use of nutrients. It may shift a mildly malnourished person toward severe malnutrition. Document Released: 03/26/2005 Document Revised: 02/11/2011 Document Reviewed: 09/09/2005 [facility] Patient Information [address]on 2012 [place], [place].

## 2011-07-10 NOTE — ED Provider Notes (Signed)
Medical screening examination/treatment/procedure(s) were conducted as a shared visit with non-physician practitioner(s) and myself.  I personally evaluated the patient during the encounter  Pt states he is here because he is intoxicated.  Pt denies complaints at this time. Alert and asking for something to drink.  Will monitor in ED and reassess once sober.  Celene Kras, MD 07/10/11 [phone]

## 2011-07-10 NOTE — ED Notes (Signed)
In pt room, pt a&o x 4. States "i have to be in court by 9:00 this morning and I cant be late so Im ready to go." Dr. Hyacinth Meeker notified, pt to be discharged home. Pt belongings returned and pt advised that he would be discharged home.

## 2011-08-22 ENCOUNTER — Emergency Department (HOSPITAL_COMMUNITY)
Admission: EM | Admit: 2011-08-22 | Discharge: 2011-08-23 | Disposition: A | Discharge status: Home / Self Care | Payer: Self-pay | Attending: Emergency Medicine | Admitting: Emergency Medicine

## 2011-08-22 ENCOUNTER — Encounter (HOSPITAL_COMMUNITY): Payer: Self-pay | Admitting: *Deleted

## 2011-08-22 DIAGNOSIS — F10929 Alcohol use, unspecified with intoxication, unspecified: Secondary | ICD-10-CM

## 2011-08-22 DIAGNOSIS — Z5900 Homelessness unspecified: Secondary | ICD-10-CM | POA: Insufficient documentation

## 2011-08-22 DIAGNOSIS — R229 Localized swelling, mass and lump, unspecified: Secondary | ICD-10-CM | POA: Insufficient documentation

## 2011-08-22 DIAGNOSIS — F1010 Alcohol abuse, uncomplicated: Secondary | ICD-10-CM | POA: Insufficient documentation

## 2011-08-22 LAB — CBC
HCT: 35.5 % — ABNORMAL LOW (ref 39.0–52.0)
Hemoglobin: 12.4 g/dL — ABNORMAL LOW (ref 13.0–17.0)
MCH: 28.5 pg (ref 26.0–34.0)
MCHC: 34.9 g/dL (ref 30.0–36.0)
MCV: 81.6 fL (ref 78.0–100.0)
Platelets: 436 K/uL — ABNORMAL HIGH (ref 150–400)
RBC: 4.35 MIL/uL (ref 4.22–5.81)
RDW: 14.6 % (ref 11.5–15.5)
WBC: 5.6 K/uL (ref 4.0–10.5)

## 2011-08-22 LAB — COMPREHENSIVE METABOLIC PANEL WITH GFR
ALT: 33 U/L (ref 0–53)
AST: 35 U/L (ref 0–37)
Albumin: 3.9 g/dL (ref 3.5–5.2)
Alkaline Phosphatase: 81 U/L (ref 39–117)
BUN: 7 mg/dL (ref 6–23)
CO2: 28 meq/L (ref 19–32)
Calcium: 8.5 mg/dL (ref 8.4–10.5)
Chloride: 94 meq/L — ABNORMAL LOW (ref 96–112)
Creatinine, Ser: 0.81 mg/dL (ref 0.50–1.35)
GFR calc Af Amer: >90 mL/min
GFR calc non Af Amer: >90 mL/min
Glucose, Bld: 100 mg/dL — ABNORMAL HIGH (ref 70–99)
Potassium: 4.2 meq/L (ref 3.5–5.1)
Sodium: 130 meq/L — ABNORMAL LOW (ref 135–145)
Total Bilirubin: 0.2 mg/dL — ABNORMAL LOW (ref 0.3–1.2)
Total Protein: 7.6 g/dL (ref 6.0–8.3)

## 2011-08-22 LAB — ETHANOL: Alcohol, Ethyl (B): 379 mg/dL — ABNORMAL HIGH (ref 0–11)

## 2011-08-22 NOTE — ED Notes (Signed)
Pt found passed out on side of [address]. EMS called. Pt homeless. ETOH on board. Just released from jail this am.

## 2011-08-22 NOTE — ED Provider Notes (Signed)
History     CSN: [id number]  Arrival date & time 08/22/11  4098   First MD Initiated Contact with Patient 08/22/11 2008      Chief Complaint  Patient presents with  . Alcohol Intoxication  . Medical Clearance    (Consider location/radiation/quality/duration/timing/severity/associated sxs/prior treatment) HPI  40yoM h/o chronic alcohol abuse presents with likely intoxication. The patient states he was released from jail today. He states he drank an unknown amount of alcohol. He was found by police officers on the ground outside. Patient states he is homeless and he didn't have anywhere to go. He states he cannot recall if he hit his head. He denies headache, dizziness. He denies numbness, tingling, weakness of his extremities. He denies suicidal ideation, homicidal ideation, auditory visual hallucinations. He states "I'm drunk and I just want something eat". Denies etoh abuse, and declining "I don't want to no help with alcohol because I aint no alcoholic"  ED Notes, ED Provider Notes from 08/22/11 0000 to 08/22/11 19:23:19       Darla Lesches, RN 08/22/2011 18:27      Pt found passed out on side of [address]. EMS called. Pt homeless. ETOH on board. Just released from jail this am.     Past Medical History  Diagnosis Date  . Alcohol abuse   . Anxiety   . Depression   . Acid reflux     Past Surgical History  Procedure Date  . Alcohol abuse   . Left leg surgery   . Facial reconstruction surgery     d/t facial fx's    History reviewed. No pertinent family history.  History  Substance Use Topics  . Smoking status: Current Everyday Smoker -- 0.5 packs/day    Types: Cigarettes  . Smokeless tobacco: Not on file  . Alcohol Use: Yes     drink wine constantly all day      Review of Systems  All other systems reviewed and are negative.   except as noted HPI   Allergies  Review of patient's allergies indicates no known allergies.  Home Medications   Current  Outpatient Rx  Name Route Sig Dispense Refill  . CITALOPRAM HYDROBROMIDE 20 MG PO TABS Oral Take 20 mg by mouth daily.      [name] OMEPRAZOLE 20 MG PO CPDR Oral Take 20 mg by mouth daily. Heart burn     . TRAZODONE HCL 150 MG PO TABS Oral Take 150 mg by mouth at bedtime.        BP 119/73  Pulse 62  Temp(Src) 98.5 F (36.9 C) (Oral)  Resp 16  SpO2 95%  Physical Exam  Nursing note and vitals reviewed. Constitutional: He is oriented to person, place, and time. He appears well-developed and well-nourished. No distress.  HENT:  Head: Atraumatic.  Mouth/Throat: Oropharynx is clear and moist.  Eyes: Conjunctivae are normal. Pupils are equal, round, and reactive to light.  Neck: Neck supple.  Cardiovascular: Normal rate, regular rhythm, normal heart sounds and intact distal pulses.  Exam reveals no gallop and no friction rub.   No murmur heard. Pulmonary/Chest: Effort normal. No respiratory distress. He has no wheezes. He has no rales.  Abdominal: Soft. Bowel sounds are normal. There is no tenderness. There is no rebound and no guarding.  Musculoskeletal: Normal range of motion. He exhibits no edema and no tenderness.  Neurological: He is alert and oriented to person, place, and time. No cranial nerve deficit. He exhibits normal muscle tone. Coordination normal.  Skin: Skin is warm and dry.       Small epigastric skin nodule. No warmth/erythema/ttp  Psychiatric: He has a normal mood and affect.    ED Course  Procedures (including critical care time)  Labs Reviewed  CBC - Abnormal; Notable for the following:    Hemoglobin 12.4 (*)    HCT 35.5 (*)    Platelets 436 (*)    All other components within normal limits  COMPREHENSIVE METABOLIC PANEL - Abnormal; Notable for the following:    Sodium 130 (*)    Chloride 94 (*)    Glucose, Bld 100 (*)    Total Bilirubin 0.2 (*)    All other components within normal limits  ETHANOL - Abnormal; Notable for the following:    Alcohol, Ethyl (B)  379 (*)    All other components within normal limits   No results found.   1. Alcohol intoxication       MDM  Likely ETOH intoxication. Will check labs, reassess.   ETOH level as above. Patient sleeping at this time. Sober and discharge in morning as it is 1247AM at this time. Patient signed out to Dr. Dierdre Highman.        Forbes Cellar, MD 08/23/11 [phone]

## 2011-08-23 NOTE — Discharge Instructions (Signed)
Alcohol Intoxication Alcohol intoxication means your blood alcohol level is above legal limits. Alcohol is a drug. It has serious side effects. These side effects can include:  Damage to your organs (liver, nervous system, and blood system).   Unclear thinking.   Slowed reflexes.   Decreased muscle coordination.  HOME CARE  Do not drink and drive.   Do not drink alcohol if you are taking medicine or using other drugs. Doing so can cause serious medical problems or even death.   Drink enough water and fluids to keep your pee (urine) clear or pale yellow.   Eat healthy foods.   Only take medicine as told by your doctor.   Join an alcohol support group.  GET HELP RIGHT AWAY IF:  You become shaky when you stop drinking.   Your thinking is unclear or you become confused.   You throw up (vomit) blood. It may look bright red or like coffee grounds.   You notice blood in your poop (bowel movements).   You become lightheaded or pass out (faint).  MAKE SURE YOU:   Understand these instructions.   Will watch your condition.   Will get help right away if you are not doing well or get worse.  Document Released: 11/18/2007 Document Revised: 05/21/2011 Document Reviewed: 11/18/2009 [facility] Patient Information 2012 [place], [place].  Alcohol Problems Most adults who drink alcohol drink in moderation (not a lot) are at low risk for developing problems related to their drinking. However, all drinkers, including low-risk drinkers, should know about the health risks connected with drinking alcohol. RECOMMENDATIONS FOR LOW-RISK DRINKING  Drink in moderation. Moderate drinking is defined as follows:   Men - no more than 2 drinks per day.   Nonpregnant women - no more than 1 drink per day.   Over age 36 - no more than 1 drink per day.  A standard drink is 12 grams of pure alcohol, which is equal to a 12 ounce bottle of beer or wine cooler, a 5 ounce glass of wine, or 1.5 ounces of  distilled spirits (such as whiskey, brandy, vodka, or rum).  ABSTAIN FROM (DO NOT DRINK) ALCOHOL:  When pregnant or considering pregnancy.   When taking a medication that interacts with alcohol.   If you are alcohol dependent.   A medical condition that prohibits drinking alcohol (such as ulcer, liver disease, or heart disease).  DISCUSS WITH YOUR CAREGIVER:  If you are at risk for coronary heart disease, discuss the potential benefits and risks of alcohol use: Light to moderate drinking is associated with lower rates of coronary heart disease in certain populations (for example, men over age 35 and postmenopausal women). Infrequent or nondrinkers are advised not to begin light to moderate drinking to reduce the risk of coronary heart disease so as to avoid creating an alcohol-related problem. Similar protective effects can likely be gained through proper diet and exercise.   Women and the elderly have smaller amounts of body water than men. As a result women and the elderly achieve a higher blood alcohol concentration after drinking the same amount of alcohol.   Exposing a fetus to alcohol can cause a broad range of birth defects referred to as Fetal Alcohol Syndrome (FAS) or Alcohol-Related Birth Defects (ARBD). Although FAS/ARBD is connected with excessive alcohol consumption during pregnancy, studies also have reported neurobehavioral problems in infants born to mothers reporting drinking an average of 1 drink per day during pregnancy.   Heavier drinking (the consumption of more  than 4 drinks per occasion by men and more than 3 drinks per occasion by women) impairs learning (cognitive) and psychomotor functions and increases the risk of alcohol-related problems, including accidents and injuries.  CAGE QUESTIONS:   Have you ever felt that you should Cut down on your drinking?   Have people Annoyed you by criticizing your drinking?   Have you ever felt bad or Guilty about your drinking?     Have you ever had a drink first thing in the morning to steady your nerves or get rid of a hangover (Eye opener)?  If you answered positively to any of these questions: You may be at risk for alcohol-related problems if alcohol consumption is:   Men: Greater than 14 drinks per week or more than 4 drinks per occasion.   Women: Greater than 7 drinks per week or more than 3 drinks per occasion.  Do you or your family have a medical history of alcohol-related problems, such as:  Blackouts.   Sexual dysfunction.   Depression.   Trauma.   Liver dysfunction.   Sleep disorders.   Hypertension.   Chronic abdominal pain.   Has your drinking ever caused you problems, such as problems with your family, problems with your work (or school) performance, or accidents/injuries?   Do you have a compulsion to drink or a preoccupation with drinking?   Do you have poor control or are you unable to stop drinking once you have started?   Do you have to drink to avoid withdrawal symptoms?   Do you have problems with withdrawal such as tremors, nausea, sweats, or mood disturbances?   Does it take more alcohol than in the past to get you high?   Do you feel a strong urge to drink?   Do you change your plans so that you can have a drink?   Do you ever drink in the morning to relieve the shakes or a hangover?  If you have answered a number of the previous questions positively, it may be time for you to talk to your caregivers, family, and friends and see if they think you have a problem. Alcoholism is a chemical dependency that keeps getting worse and will eventually destroy your health and relationships. Many alcoholics end up dead, impoverished, or in prison. This is often the end result of all chemical dependency.  Do not be discouraged if you are not ready to take action immediately.   Decisions to change behavior often involve up and down desires to change and feeling like you cannot  decide.   Try to think more seriously about your drinking behavior.   Think of the reasons to quit.  WHERE TO GO FOR ADDITIONAL INFORMATION   The National Institute on Alcohol Abuse and Alcoholism (NIAAA)www.niaaa.nih.gov   [employer] on Alcoholism and Drug Dependence (NCADD)www.ncadd.org   American Society of Addiction Medicine (ASAM)www.https://anderson-johnson.com/  Document Released: 06/01/2005 Document Revised: 05/21/2011 Document Reviewed: 01/18/2008 [facility] Patient Information 2012 [place], [place].

## 2011-08-23 NOTE — ED Notes (Signed)
Spoke with EDP Dierdre Highman about this pt- will observe this pt until morning

## 2011-08-25 ENCOUNTER — Encounter (HOSPITAL_COMMUNITY): Payer: Self-pay | Admitting: Emergency Medicine

## 2011-08-25 ENCOUNTER — Emergency Department (HOSPITAL_COMMUNITY)
Admission: EM | Admit: 2011-08-25 | Discharge: 2011-08-25 | Disposition: A | Discharge status: Home / Self Care | Payer: Self-pay | Attending: Emergency Medicine | Admitting: Emergency Medicine

## 2011-08-25 DIAGNOSIS — F10929 Alcohol use, unspecified with intoxication, unspecified: Secondary | ICD-10-CM

## 2011-08-25 DIAGNOSIS — R4789 Other speech disturbances: Secondary | ICD-10-CM | POA: Insufficient documentation

## 2011-08-25 DIAGNOSIS — F1010 Alcohol abuse, uncomplicated: Secondary | ICD-10-CM | POA: Insufficient documentation

## 2011-08-25 DIAGNOSIS — F17200 Nicotine dependence, unspecified, uncomplicated: Secondary | ICD-10-CM | POA: Insufficient documentation

## 2011-08-25 LAB — DIFFERENTIAL
Lymphocytes Relative: 34 % (ref 12–46)
Lymphs Abs: 1.3 10*3/uL (ref 0.7–4.0)
Monocytes Absolute: 0.2 10*3/uL (ref 0.1–1.0)
Monocytes Relative: 5 % (ref 3–12)
Neutro Abs: 2.2 10*3/uL (ref 1.7–7.7)

## 2011-08-25 LAB — CBC
HCT: 39.8 % (ref 39.0–52.0)
Hemoglobin: 13.8 g/dL (ref 13.0–17.0)
WBC: 3.7 10*3/uL — ABNORMAL LOW (ref 4.0–10.5)

## 2011-08-25 LAB — ETHANOL: Alcohol, Ethyl (B): 419 mg/dL (ref 0–11)

## 2011-08-25 LAB — BASIC METABOLIC PANEL
BUN: 8 mg/dL (ref 6–23)
CO2: 28 mEq/L (ref 19–32)
Chloride: 94 mEq/L — ABNORMAL LOW (ref 96–112)
Creatinine, Ser: 0.82 mg/dL (ref 0.50–1.35)

## 2011-08-25 MED ORDER — THIAMINE HCL 100 MG/ML IJ SOLN
100.0000 mg | Freq: Once | INTRAMUSCULAR | Status: AC
  Administered 2011-08-25: 100 mg via INTRAVENOUS
  Filled 2011-08-25: qty 2

## 2011-08-25 MED ORDER — SODIUM CHLORIDE 0.9 % IV BOLUS (SEPSIS)
1000.0000 mL | Freq: Once | INTRAVENOUS | Status: AC
  Administered 2011-08-25: 1000 mL via INTRAVENOUS

## 2011-08-25 NOTE — ED Notes (Signed)
Noncompliant with triage process-intoxicated

## 2011-08-25 NOTE — ED Notes (Signed)
Per EMS-found down/unresponsive on sidewalk intoxicated-homeless
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
## 2011-08-25 NOTE — ED Notes (Signed)
Tolerated ambulation to bathroom

## 2011-08-25 NOTE — Discharge Instructions (Signed)
Finding Treatment for Alcohol and Drug Addiction It can be hard to find the right place to get professional treatment. Here are some important things to consider:  There are different types of treatment to choose from.   Some programs are live-in (residential) while others are not (outpatient). Sometimes a combination is offered.   No single type of program is right for everyone.   Most treatment programs involve a combination of education, counseling, and a 12-step, spiritually-based approach.   There are non-spiritually based programs (not 12-step).   Some treatment programs are government sponsored. They are geared for patients without private insurance.   Treatment programs can vary in many respects such as:   Cost and types of insurance accepted.   Types of on-site medical services offered.   Length of stay, setting, and size.   Overall philosophy of treatment.  A person may need specialized treatment or have needs not addressed by all programs. For example, adolescents need treatment appropriate for their age. Other people have secondary disorders that must be managed as well. Secondary conditions can include mental illness, such as depression or diabetes. Often, a period of detoxification from alcohol or drugs is needed. This requires medical supervision and not all programs offer this. THINGS TO CONSIDER WHEN SELECTING A TREATMENT PROGRAM   Is the program certified by the appropriate government agency? Even private programs must be certified and employ certified professionals.   Does the program accept your insurance? If not, can a payment plan be set up?   Is the facility clean, organized, and well run? Do they allow you to speak with graduates who can share their treatment experience with you? Can you tour the facility? Can you meet with staff?   Does the program meet the full range of individual needs?   Does the treatment program address sexual orientation and physical  disabilities? Do they provide age, gender, and culturally appropriate treatment services?   Is treatment available in languages other than English?   Is long-term aftercare support or guidance encouraged and provided?   Is assessment of an individual's treatment plan ongoing to ensure it meets changing needs?   Does the program use strategies to encourage reluctant patients to remain in treatment long enough to increase the likelihood of success?   Does the program offer counseling (individual or group) and other behavioral therapies?   Does the program offer medicine as part of the treatment regimen, if needed?   Is there ongoing monitoring of possible relapse? Is there a defined relapse prevention program? Are services or referrals offered to family members to ensure they understand addiction and the recovery process? This would help them support the recovering individual.   Are 12-step meetings held at the center or is transport available for patients to attend outside meetings?  In countries outside of the [place]. and [place], Magazine features editor for contact information for services in your area. Document Released: 04/30/2005 Document Revised: 05/21/2011 Document Reviewed: 11/10/2007 [facility] Patient Information 2012 [place], [place]. RESOURCE GUIDE  Dental Problems  Patients with Medicaid: [facility]                     [phone] [address]                                           Phone:  [phone]  If unable to pay or uninsured, contact:  [facility] or [facility]. to become qualified for the adult dental clinic.  Chronic Pain Problems Contact [facility]  [phone] Patients need to be referred by their primary care doctor.  Insufficient Money for Medicine Contact United Way:  call "211" or [facility] [phone].  No Primary Care Doctor Call [facility]   [phone] Other agencies that provide inexpensive medical care    [facility]  [phone]    [facility]  [phone]    [facility]  [phone]    [facility]  [phone]    [facility]  3527991111    [facility]  [phone]  Substance Abuse Resources [facility]  [phone] [facility] [phone] [place] [phone] Floydene Flock [phone] Residential & Outpatient Substance Abuse Program  [phone]  Psychological Services [facility] Behavioral Health  [phone] [facility]  [phone] [facility] Mental Health   [phone] (emergency services [phone])  Abuse/Neglect [facility] Child Abuse Hotline (615)274-8934 [facility] Child Abuse Hotline (573)394-4084 (After Hours)  Emergency Shelter [facility] [phone]  Maternity [facility] [phone] [facility] [phone]  MRSA Hotline #:   (985)004-5150    [facility] Resources  [facility]  United Way                           [facility] Dept. [address] [place]                     [address]         [address]  Blondell Reveal Phone:  [phone]                                  Phone:  [phone]                   Phone:  [phone]  [facility] Mental Health Phone:  [phone]  [facility] Child Abuse Hotline 2267878395 [phone] (After Hours)

## 2011-08-25 NOTE — Progress Notes (Signed)
ED CM noted pt as self pay with no pcp listed. Spoke with pt who confirms he has "just gotten out of prison".  Pt provided with IRC contact information, [facility] information, financial resources and self pay pcps. Pt appreciative of resources provided.  Left written information with pt for discussed resources

## 2011-08-25 NOTE — ED Notes (Signed)
Pt refused vitals 
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
## 2011-08-25 NOTE — ED Notes (Signed)
Pt refused to sign his DC inst.  He voided in a styrafoam cup that was full and it spilled on the floor.

## 2011-09-06 ENCOUNTER — Emergency Department (HOSPITAL_COMMUNITY): Payer: Self-pay

## 2011-09-06 ENCOUNTER — Emergency Department (HOSPITAL_COMMUNITY)
Admission: EM | Admit: 2011-09-06 | Discharge: 2011-09-07 | Disposition: A | Discharge status: Home / Self Care | Payer: Self-pay | Attending: Emergency Medicine | Admitting: Emergency Medicine

## 2011-09-06 ENCOUNTER — Encounter (HOSPITAL_COMMUNITY): Payer: Self-pay

## 2011-09-06 DIAGNOSIS — F10929 Alcohol use, unspecified with intoxication, unspecified: Secondary | ICD-10-CM

## 2011-09-06 DIAGNOSIS — K219 Gastro-esophageal reflux disease without esophagitis: Secondary | ICD-10-CM | POA: Insufficient documentation

## 2011-09-06 DIAGNOSIS — F1010 Alcohol abuse, uncomplicated: Secondary | ICD-10-CM | POA: Insufficient documentation

## 2011-09-06 DIAGNOSIS — F341 Dysthymic disorder: Secondary | ICD-10-CM | POA: Insufficient documentation

## 2011-09-06 LAB — CBC
MCH: 28.2 pg (ref 26.0–34.0)
MCHC: 34.7 g/dL (ref 30.0–36.0)
RDW: 16 % — ABNORMAL HIGH (ref 11.5–15.5)

## 2011-09-06 LAB — RAPID URINE DRUG SCREEN, HOSP PERFORMED
Amphetamines: NOT DETECTED
Barbiturates: NOT DETECTED
Benzodiazepines: NOT DETECTED

## 2011-09-06 LAB — COMPREHENSIVE METABOLIC PANEL
ALT: 25 U/L (ref 0–53)
AST: 38 U/L — ABNORMAL HIGH (ref 0–37)
Alkaline Phosphatase: 102 U/L (ref 39–117)
CO2: 28 mEq/L (ref 19–32)
Chloride: 100 mEq/L (ref 96–112)
GFR calc Af Amer: >90 mL/min (ref >90)
GFR calc non Af Amer: >90 mL/min (ref >90)
Glucose, Bld: 104 mg/dL — ABNORMAL HIGH (ref 70–99)
Potassium: 3.6 mEq/L (ref 3.5–5.1)
Sodium: 140 mEq/L (ref 135–145)
Total Bilirubin: 0.2 mg/dL — ABNORMAL LOW (ref 0.3–1.2)

## 2011-09-06 LAB — DIFFERENTIAL
Basophils Absolute: 0 10*3/uL (ref 0.0–0.1)
Basophils Relative: 1 % (ref 0–1)
Eosinophils Absolute: 0.1 10*3/uL (ref 0.0–0.7)
Monocytes Relative: 10 % (ref 3–12)
Neutro Abs: 2.1 10*3/uL (ref 1.7–7.7)
Neutrophils Relative %: 53 % (ref 43–77)

## 2011-09-06 LAB — ETHANOL: Alcohol, Ethyl (B): 414 mg/dL (ref 0–11)

## 2011-09-06 LAB — SALICYLATE LEVEL: Salicylate Lvl: <2 mg/dL — ABNORMAL LOW (ref 2.8–20.0)

## 2011-09-06 MED ORDER — GI COCKTAIL ~~LOC~~
30.0000 mL | Freq: Once | ORAL | Status: AC
  Administered 2011-09-06: 30 mL via ORAL
  Filled 2011-09-06: qty 30

## 2011-09-06 MED ORDER — HYDROXYZINE HCL 25 MG PO TABS: 25.0000 mg | ORAL_TABLET | Freq: Four times a day (QID) | ORAL | Status: DC | PRN

## 2011-09-06 MED ORDER — VITAMIN B-1 100 MG PO TABS: 100.0000 mg | ORAL_TABLET | Freq: Every day | ORAL | Status: DC

## 2011-09-06 MED ORDER — ADULT MULTIVITAMIN W/MINERALS CH
1.0000 | ORAL_TABLET | ORAL | Status: AC
  Administered 2011-09-07: 1 via ORAL
  Filled 2011-09-06: qty 1

## 2011-09-06 MED ORDER — ONDANSETRON 4 MG PO TBDP: 4.0000 mg | ORAL_TABLET | Freq: Once | ORAL | Status: DC

## 2011-09-06 MED ORDER — THIAMINE HCL 100 MG/ML IJ SOLN
100.0000 mg | Freq: Once | INTRAMUSCULAR | Status: AC
  Administered 2011-09-06: 100 mg via INTRAMUSCULAR
  Filled 2011-09-06: qty 2

## 2011-09-06 MED ORDER — CHLORDIAZEPOXIDE HCL 25 MG PO CAPS: 25.0000 mg | ORAL_CAPSULE | Freq: Four times a day (QID) | ORAL | Status: DC | PRN

## 2011-09-06 MED ORDER — ONDANSETRON 4 MG PO TBDP
4.0000 mg | ORAL_TABLET | Freq: Four times a day (QID) | ORAL | Status: DC | PRN
  Administered 2011-09-06 – 2011-09-07 (×3): 4 mg via ORAL
  Filled 2011-09-06 (×3): qty 1

## 2011-09-06 MED ORDER — LOPERAMIDE HCL 2 MG PO CAPS: 2.0000 mg | ORAL_CAPSULE | ORAL | Status: DC | PRN

## 2011-09-06 NOTE — ED Notes (Signed)
PT provided with food and beverage per request. Given paper scrubs.

## 2011-09-06 NOTE — ED Notes (Signed)
RN introduced self to pt. Pt agitated and reports nauseated having heart burn. RN discussed with MD.
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
## 2011-09-06 NOTE — ED Notes (Signed)
Patient is resting comfortably. 

## 2011-09-06 NOTE — ED Provider Notes (Signed)
I saw and evaluated the patient, reviewed the resident's note and I agree with the findings and plan.   Rolan Bucco, MD 09/06/11 [phone]

## 2011-09-06 NOTE — ED Notes (Signed)
Pt resting no signs of distress

## 2011-09-06 NOTE — ED Notes (Signed)
Patient now requesting detox, md made aware of same, pt taken to xray.

## 2011-09-06 NOTE — ED Notes (Signed)
Pt ambulates to restroom in NAD
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
## 2011-09-06 NOTE — ED Notes (Signed)
PT reporting he will not eat lunchbox ED provided. PT watching TV in bed; no signs of distress; respirations even and unlabored; skin warm and dry; no tremors noted.

## 2011-09-07 LAB — ETHANOL: Alcohol, Ethyl (B): 130 mg/dL — ABNORMAL HIGH (ref 0–11)

## 2011-09-07 NOTE — ED Notes (Signed)
PT provided with beverage per request

## 2011-09-07 NOTE — ED Provider Notes (Signed)
I saw and evaluated the patient, reviewed the resident's note and I agree with the findings and plan.   Halim Surrette, MD 09/07/11 0051 

## 2011-09-07 NOTE — Discharge Instructions (Signed)
Finding Treatment for Alcohol and Drug Addiction   It can be hard to find the right place to get professional treatment. Here are some important things to consider:   There are different types of treatment to choose from.   Some programs are live-in (residential) while others are not (outpatient). Sometimes a combination is offered.   No single type of program is right for everyone.   Most treatment programs involve a combination of education, counseling, and a 12-step, spiritually-based approach.   There are non-spiritually based programs (not 12-step).   Some treatment programs are government sponsored. They are geared for patients without private insurance.   Treatment programs can vary in many respects such as:   Cost and types of insurance accepted.   Types of on-site medical services offered.   Length of stay, setting, and size.   Overall philosophy of treatment.   A person may need specialized treatment or have needs not addressed by all programs. For example, adolescents need treatment appropriate for their age. Other people have secondary disorders that must be managed as well. Secondary conditions can include mental illness, such as depression or diabetes. Often, a period of detoxification from alcohol or drugs is needed. This requires medical supervision and not all programs offer this.   THINGS TO CONSIDER WHEN SELECTING A TREATMENT PROGRAM   Is the program certified by the appropriate government agency? Even private programs must be certified and employ certified professionals.   Does the program accept your insurance? If not, can a payment plan be set up?   Is the facility clean, organized, and well run? Do they allow you to speak with graduates who can share their treatment experience with you? Can you tour the facility? Can you meet with staff?   Does the program meet the full range of individual needs?   Does the treatment program address sexual orientation and physical disabilities? Do they provide  age, gender, and culturally appropriate treatment services?   Is treatment available in languages other than English?   Is long-term aftercare support or guidance encouraged and provided?   Is assessment of an individual's treatment plan ongoing to ensure it meets changing needs?   Does the program use strategies to encourage reluctant patients to remain in treatment long enough to increase the likelihood of success?   Does the program offer counseling (individual or group) and other behavioral therapies?   Does the program offer medicine as part of the treatment regimen, if needed?   Is there ongoing monitoring of possible relapse? Is there a defined relapse prevention program? Are services or referrals offered to family members to ensure they understand addiction and the recovery process? This would help them support the recovering individual.   Are 12-step meetings held at the center or is transport available for patients to attend outside meetings?  In countries outside of the [place], see local directories for contact information for services in your area.   Document Released: 04/30/2005 Document Revised: 05/21/2011 Document Reviewed: 11/10/2007   ExitCare® Patient Information ©2012 [employer].
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
## 2011-09-07 NOTE — ED Notes (Signed)
Pt sleeping.  Respirations even and unlabored.

## 2011-09-11 ENCOUNTER — Encounter (HOSPITAL_COMMUNITY): Payer: Self-pay | Admitting: *Deleted

## 2011-09-11 ENCOUNTER — Emergency Department (HOSPITAL_COMMUNITY)
Admission: EM | Admit: 2011-09-11 | Discharge: 2011-09-12 | Disposition: A | Discharge status: Home / Self Care | Payer: Self-pay | Attending: Emergency Medicine | Admitting: Emergency Medicine

## 2011-09-11 DIAGNOSIS — F341 Dysthymic disorder: Secondary | ICD-10-CM | POA: Insufficient documentation

## 2011-09-11 DIAGNOSIS — F10929 Alcohol use, unspecified with intoxication, unspecified: Secondary | ICD-10-CM

## 2011-09-11 DIAGNOSIS — F1010 Alcohol abuse, uncomplicated: Secondary | ICD-10-CM | POA: Insufficient documentation

## 2011-09-11 DIAGNOSIS — K219 Gastro-esophageal reflux disease without esophagitis: Secondary | ICD-10-CM | POA: Insufficient documentation

## 2011-09-11 LAB — RAPID URINE DRUG SCREEN, HOSP PERFORMED: Barbiturates: NOT DETECTED

## 2011-09-11 LAB — CBC
HCT: 34.7 % — ABNORMAL LOW (ref 39.0–52.0)
Hemoglobin: 11.6 g/dL — ABNORMAL LOW (ref 13.0–17.0)
MCHC: 33.4 g/dL (ref 30.0–36.0)

## 2011-09-11 LAB — COMPREHENSIVE METABOLIC PANEL
Alkaline Phosphatase: 98 U/L (ref 39–117)
BUN: 8 mg/dL (ref 6–23)
CO2: 29 mEq/L (ref 19–32)
Chloride: 100 mEq/L (ref 96–112)
GFR calc Af Amer: >90 mL/min (ref >90)
GFR calc non Af Amer: >90 mL/min (ref >90)
Glucose, Bld: 94 mg/dL (ref 70–99)
Potassium: 3.5 mEq/L (ref 3.5–5.1)
Total Bilirubin: 0.3 mg/dL (ref 0.3–1.2)
Total Protein: 7.6 g/dL (ref 6.0–8.3)

## 2011-09-11 LAB — ETHANOL: Alcohol, Ethyl (B): 470 mg/dL (ref 0–11)

## 2011-09-11 MED ORDER — HALOPERIDOL LACTATE 5 MG/ML IJ SOLN
5.0000 mg | Freq: Four times a day (QID) | INTRAMUSCULAR | Status: DC | PRN
  Administered 2011-09-11: 5 mg via INTRAMUSCULAR
  Filled 2011-09-11: qty 1

## 2011-09-11 NOTE — ED Notes (Signed)
Report given to racheal, rn

## 2011-09-11 NOTE — ED Notes (Addendum)
ems reports pt is from home. Alert and oriented to his norm. ETOH on board, pt reports he wants detox. pts smells extremely like ETOH. Pt was noncomplaint and not letting ems take vitals. Reported pain 10/10 all over.

## 2011-09-11 NOTE — ED Notes (Signed)
Pt getting loud and agitated, swearing and using profanity. md alerted that pts was becoming increasingly agitated and more violent.
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
## 2011-09-11 NOTE — ED Provider Notes (Signed)
History     CSN: [id number]  Arrival date & time 09/11/11  1659   First MD Initiated Contact with Patient 09/11/11 1827      Chief Complaint  Patient presents with  . Alcohol Intoxication    (Consider location/radiation/quality/duration/timing/severity/associated sxs/prior treatment) HPI Comments: The patient is a 41 year old male in no alcoholic who presents by EMS from home, having called EMS to bring him to the hospital because he states that he wants detox from alcohol and treatment for alcohol addiction and abuse. The patient had reportedly consumed large amounts of alcohol during the day today and appears significantly intoxicated, although alert and oriented to person, place, time, and event, but agitated, elevated mood, and belligerent. The patient was refusing care by EMS and nurses intermittently. I gave the patient 5 mg of haloperidol intramuscularly to ease his agitation. On evaluation, the patient is sleeping but easily aroused to voice oriented appropriately, admits to alcohol abuse chronically and acutely, and states the only reason for his visit today is for treatment of alcohol abuse. He denies any other drug use or any other problems. Vital signs are stable. No other history is obtainable at this time do to alcohol intoxication.  Patient is a 41 y.o. male presenting with intoxication. The history is provided by the patient and the EMS personnel. History Limited By: Alcohol intoxication.  Alcohol Intoxication This is a chronic problem. Pertinent negatives include no chest pain, no abdominal pain, no headaches and no shortness of breath.    Past Medical History  Diagnosis Date  . Alcohol abuse   . Anxiety   . Depression   . Acid reflux     Past Surgical History  Procedure Date  . Alcohol abuse   . Left leg surgery   . Facial reconstruction surgery     d/t facial fx's    History reviewed. No pertinent family history.  History  Substance Use Topics  . Smoking  status: Current Everyday Smoker -- 0.5 packs/day    Types: Cigarettes  . Smokeless tobacco: Not on file  . Alcohol Use: Yes     drink wine constantly all day      Review of Systems  Unable to perform ROS: Other  Respiratory: Negative for shortness of breath.   Cardiovascular: Negative for chest pain.  Gastrointestinal: Negative for abdominal pain.  Neurological: Negative for headaches.    Allergies  Review of patient's allergies indicates no known allergies.  Home Medications  No current outpatient prescriptions on file.  BP 137/89  Pulse 83  Temp(Src) 98.6 F (37 C) (Oral)  Resp 16  SpO2 96%  Physical Exam  Nursing note and vitals reviewed. Constitutional: He appears well-developed and well-nourished. He appears lethargic. No distress.  HENT:  Head: Normocephalic and atraumatic.  Mouth/Throat: Oropharynx is clear and moist.  Eyes: Conjunctivae and EOM are normal. Pupils are equal, round, and reactive to light.  Neck: Normal range of motion. Neck supple.  Cardiovascular: Normal rate, regular rhythm, normal heart sounds and intact distal pulses.  Exam reveals no gallop and no friction rub.   No murmur heard. Pulmonary/Chest: Effort normal and breath sounds normal. No respiratory distress. He has no wheezes. He has no rales. He exhibits no tenderness.  Abdominal: Soft. Bowel sounds are normal. He exhibits no distension. There is no tenderness. There is no rebound and no guarding.  Musculoskeletal: Normal range of motion. He exhibits no edema and no tenderness.  Neurological: He has normal reflexes. He appears lethargic. No cranial  nerve deficit. He exhibits normal muscle tone. Gait normal.  Skin: Skin is warm and dry. No rash noted. He is not diaphoretic. No erythema. No pallor.    ED Course  Procedures (including critical care time)  Labs Reviewed  CBC - Abnormal; Notable for the following:    WBC 3.6 (*)    RBC 4.21 (*)    Hemoglobin 11.6 (*)    HCT 34.7 (*)     RDW 16.7 (*)    All other components within normal limits  COMPREHENSIVE METABOLIC PANEL - Abnormal; Notable for the following:    AST 47 (*)    All other components within normal limits  ETHANOL - Abnormal; Notable for the following:    Alcohol, Ethyl (B) 470 (*)    All other components within normal limits  ACETAMINOPHEN LEVEL  URINE RAPID DRUG SCREEN (HOSP PERFORMED)   No results found.   No diagnosis found.    MDM  The patient is significantly intoxicated with a blood alcohol level of 470 and clinically appears intoxicated as well. He is responsive, handling his own secretions well, speaking, with stable airway, and stable ventilatory status, normal oxygenation, and stable vital signs. I will have the patient under observation in the emergency department while he is attaining greater levels of sobriety prior to moving him to the psychiatric emergency department were requesting evaluation by the ACT team.        Felisa Bonier, MD 09/11/11 2028

## 2011-09-11 NOTE — ED Notes (Signed)
Pt alert and oriented x4. Respirations even and unlabored, bilateral symmetrical rise and fall of chest. Skin warm and dry. In no acute distress. Denies needs.   

## 2011-09-11 NOTE — ED Notes (Signed)
Sitter at bedside.

## 2011-09-11 NOTE — ED Notes (Signed)
Belongings located behind nurses station.

## 2011-09-11 NOTE — ED Notes (Signed)
Pt. asking for something to drink. Charge RN Diane confirmed patient could drink. Patient given sprite.

## 2011-09-11 NOTE — ED Notes (Signed)
md alerted of critical value. pts laying in bed resting with lights off. Security at door.

## 2011-09-11 NOTE — ED Notes (Signed)
Pt alert and oriented x4. Respirations even and unlabored, bilateral symmetrical rise and fall of chest. Skin warm and dry. In no acute distress. Denies needs.  2 security guards at bedside.

## 2011-09-11 NOTE — ED Notes (Signed)
Pt. is in blue scrubs and red socks.

## 2011-09-12 ENCOUNTER — Encounter (HOSPITAL_COMMUNITY): Payer: Self-pay | Admitting: Emergency Medicine

## 2011-09-12 ENCOUNTER — Emergency Department (HOSPITAL_COMMUNITY)
Admission: EM | Admit: 2011-09-12 | Discharge: 2011-09-13 | Disposition: A | Discharge status: Home / Self Care | Payer: Self-pay | Attending: Emergency Medicine | Admitting: Emergency Medicine

## 2011-09-12 DIAGNOSIS — F17200 Nicotine dependence, unspecified, uncomplicated: Secondary | ICD-10-CM | POA: Insufficient documentation

## 2011-09-12 DIAGNOSIS — F1020 Alcohol dependence, uncomplicated: Secondary | ICD-10-CM | POA: Insufficient documentation

## 2011-09-12 LAB — POCT I-STAT, CHEM 8
BUN: 4 mg/dL — ABNORMAL LOW (ref 6–23)
Calcium, Ion: 1.04 mmol/L — ABNORMAL LOW (ref 1.12–1.32)
Chloride: 100 meq/L (ref 96–112)
Creatinine, Ser: 1.3 mg/dL (ref 0.50–1.35)
Glucose, Bld: 105 mg/dL — ABNORMAL HIGH (ref 70–99)
HCT: 37 % — ABNORMAL LOW (ref 39.0–52.0)
Hemoglobin: 12.6 g/dL — ABNORMAL LOW (ref 13.0–17.0)
Potassium: 3.1 meq/L — ABNORMAL LOW (ref 3.5–5.1)
Sodium: 139 meq/L (ref 135–145)
TCO2: 26 mmol/L (ref 0–100)

## 2011-09-12 LAB — ETHANOL
Alcohol, Ethyl (B): 157 mg/dL — ABNORMAL HIGH (ref 0–11)
Alcohol, Ethyl (B): 425 mg/dL (ref 0–11)

## 2011-09-12 MED ORDER — VITAMIN B-1 100 MG PO TABS: 100.0000 mg | ORAL_TABLET | Freq: Every day | ORAL | Status: DC

## 2011-09-12 MED ORDER — NICOTINE 21 MG/24HR TD PT24: 21.0000 mg | MEDICATED_PATCH | Freq: Every day | TRANSDERMAL | Status: DC

## 2011-09-12 MED ORDER — ACETAMINOPHEN 325 MG PO TABS: 650.0000 mg | ORAL_TABLET | ORAL | Status: DC | PRN

## 2011-09-12 MED ORDER — FOLIC ACID 1 MG PO TABS: 1.0000 mg | ORAL_TABLET | Freq: Every day | ORAL | Status: DC

## 2011-09-12 MED ORDER — ONDANSETRON 8 MG PO TBDP
8.0000 mg | ORAL_TABLET | Freq: Once | ORAL | Status: AC
  Administered 2011-09-12: 8 mg via ORAL
  Filled 2011-09-12: qty 1

## 2011-09-12 MED ORDER — ADULT MULTIVITAMIN W/MINERALS CH: 1.0000 | ORAL_TABLET | Freq: Every day | ORAL | Status: DC

## 2011-09-12 MED ORDER — LORAZEPAM 2 MG/ML IJ SOLN: 0.0000 mg | Freq: Four times a day (QID) | INTRAMUSCULAR | Status: DC

## 2011-09-12 MED ORDER — ALUM & MAG HYDROXIDE-SIMETH 200-200-20 MG/5ML PO SUSP: 30.0000 mL | ORAL | Status: DC | PRN

## 2011-09-12 MED ORDER — IBUPROFEN 600 MG PO TABS: 600.0000 mg | ORAL_TABLET | Freq: Three times a day (TID) | ORAL | Status: DC | PRN

## 2011-09-12 MED ORDER — LORAZEPAM 2 MG/ML IJ SOLN: 0.0000 mg | Freq: Two times a day (BID) | INTRAMUSCULAR | Status: DC

## 2011-09-12 MED ORDER — THIAMINE HCL 100 MG/ML IJ SOLN: 100.0000 mg | Freq: Every day | INTRAMUSCULAR | Status: DC

## 2011-09-12 MED ORDER — ZOLPIDEM TARTRATE 5 MG PO TABS: 5.0000 mg | ORAL_TABLET | Freq: Every evening | ORAL | Status: DC | PRN

## 2011-09-12 MED ORDER — ONDANSETRON HCL 4 MG PO TABS: 4.0000 mg | ORAL_TABLET | Freq: Three times a day (TID) | ORAL | Status: DC | PRN

## 2011-09-12 NOTE — ED Provider Notes (Signed)
The patient has been evaluated by the act team. Patient's last level was 157 the patient is not one detox he is not suicidal will discharge at this time.  Shelda Jakes, MD 09/12/11 [phone]
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
## 2011-09-12 NOTE — Discharge Instructions (Signed)
Alcohol Intoxication Alcohol intoxication means your blood alcohol level is above legal limits. Alcohol is a drug. It has serious side effects. These side effects can include:  Damage to your organs (liver, nervous system, and blood system).   Unclear thinking.   Slowed reflexes.   Decreased muscle coordination.  HOME CARE  Do not drink and drive.   Do not drink alcohol if you are taking medicine or using other drugs. Doing so can cause serious medical problems or even death.   Drink enough water and fluids to keep your pee (urine) clear or pale yellow.   Eat healthy foods.   Only take medicine as told by your doctor.   Join an alcohol support group.  GET HELP RIGHT AWAY IF:  You become shaky when you stop drinking.   Your thinking is unclear or you become confused.   You throw up (vomit) blood. It may look bright red or like coffee grounds.   You notice blood in your poop (bowel movements).   You become lightheaded or pass out (faint).  MAKE SURE YOU:   Understand these instructions.   Will watch your condition.   Will get help right away if you are not doing well or get worse.  Document Released: 11/18/2007 Document Revised: 05/21/2011 Document Reviewed: 11/18/2009 ExitCare Patient Information 2012 [employer].  RESOURCE GUIDE  Dental Problems  Patients with Medicaid: [facility]                     [facility] [address]                                           [address] Phone:  [phone]                                                  Phone:  [phone]  If unable to pay or uninsured, contact:  [facility] or [facility] to become qualified for the adult dental clinic.  Chronic Pain Problems Contact [facility]  [phone] Patients need to be referred by their primary care doctor.  Insufficient Money for Medicine Contact [employer]:  call "211" or [facility]  271-5999.  No Primary Care Doctor Call [facility]  832-8000 Other agencies that provide inexpensive medical care    [facility]  [phone]     Internal Medicine  [phone]    [facility]  [phone]    Women's Clinic  [phone]    [facility]  [phone]    [facility]  [phone]  Psychological Services [facility]  [phone] [facility]  [phone] [facility]   800 853-5163 (emergency services 641-4993)  Substance Abuse Resources [facility]  336-882-2125 [facility] 336-784-9470 [facility] 336-285-9073 [facility] 336-845-3988 Residential & Outpatient Substance Abuse Program  800-659-3381  Abuse/Neglect [place] Child Abuse Hotline (336) 641-3795 [place] [facility] Abuse Hotline 800-378-5315 (After Hours)  Emergency Shelter [facility] (336) 271-5985  [facility] (336) 275-9566 [facility] (704) 372-4663  MRSA Hotline #:   832-7006      [place] [facility]  [facility] [place]     [employer]                          [facility] [address] [place]                       [address]      [address]  [place]                                                [place]                            [place] Phone:  [phone]                                   Phone:  [phone]                 Phone:  [phone]  [facility] Phone:  [phone]  [place] Child Abuse Hotline (336) 342-1394 (336) 342-3537 (After Hours)   
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
## 2011-09-12 NOTE — ED Provider Notes (Signed)
Patient has been resting all night. Repeat alcohol level is 157 which is low enough for him to initiate evaluation by ACT Team. He'll be transferred to the psychiatric ED.  Dione Booze, MD 09/12/11 [phone]

## 2011-09-12 NOTE — ED Notes (Signed)
Pt is discharging home per MD.

## 2011-09-12 NOTE — ED Notes (Signed)
Pt putting his clothes on, will walk out to the security desk to get belongings from security.

## 2011-09-12 NOTE — ED Provider Notes (Signed)
History     CSN: [id number]  Arrival date & time 09/12/11  2143   First MD Initiated Contact with Patient 09/12/11 2208      Chief Complaint  Patient presents with  . Alcohol Problem   level V caveat due to intoxication and  (Consider location/radiation/quality/duration/timing/severity/associated sxs/prior treatment) Patient is a 41 y.o. male presenting with alcohol problem. The history is provided by the patient.  Alcohol Problem This is a chronic problem.   patient was discharged in the ER around 10 this morning. He was here for alcohol intoxication last night. He reportedly now was found sitting in the grass he reportedly drank a fifth of vodka and wants detox. He appears intoxicated.  Past Medical History  Diagnosis Date  . Alcohol abuse   . Anxiety   . Depression   . Acid reflux     Past Surgical History  Procedure Date  . Alcohol abuse   . Left leg surgery   . Facial reconstruction surgery     d/t facial fx's    History reviewed. No pertinent family history.  History  Substance Use Topics  . Smoking status: Current Everyday Smoker -- 0.5 packs/day    Types: Cigarettes  . Smokeless tobacco: Not on file  . Alcohol Use: Yes     drink wine constantly all day      Review of Systems  Unable to perform ROS: Other    Allergies  Review of patient's allergies indicates no known allergies.  Home Medications  No current outpatient prescriptions on file.  BP 131/84  Pulse 79  Temp(Src) 98.2 F (36.8 C) (Oral)  Resp 16  SpO2 97%  Physical Exam  Nursing note and vitals reviewed. Constitutional: He appears well-developed and well-nourished.  HENT:  Head: Normocephalic and atraumatic.  Eyes: EOM are normal. Pupils are equal, round, and reactive to light.  Neck: Normal range of motion. Neck supple.  Cardiovascular: Normal rate, regular rhythm and normal heart sounds.   No murmur heard. Pulmonary/Chest: Effort normal and breath sounds normal.  Abdominal:  Soft. Bowel sounds are normal. He exhibits no distension and no mass. There is no tenderness. There is no rebound and no guarding.  Musculoskeletal: Normal range of motion. He exhibits no edema.  Neurological: No cranial nerve deficit.       Patient appears intoxicated  Skin: Skin is warm and dry.  Psychiatric: He has a normal mood and affect.    ED Course  Procedures (including critical care time)  Labs Reviewed  POCT I-STAT, CHEM 8 - Abnormal; Notable for the following:    Potassium 3.1 (*)    BUN 4 (*)    Glucose, Bld 105 (*)    Calcium, Ion 1.04 (*)    Hemoglobin 12.6 (*)    HCT 37.0 (*)    All other components within normal limits  ETHANOL  URINE RAPID DRUG SCREEN (HOSP PERFORMED)   No results found.   No diagnosis found.    MDM  The patient returned to the ER after being here earlier in the day. He is intoxicated. He now states he wants detox. His alcohol level is too high now for evaluation by reacting. We will wait for sobriety and reevaluate him at that time. He did not want detox when he left earlier in the day.        Juliet Rude. Rubin Payor, MD 09/12/11 2358

## 2011-09-13 LAB — RAPID URINE DRUG SCREEN, HOSP PERFORMED
Cocaine: NOT DETECTED
Opiates: NOT DETECTED

## 2011-09-13 MED ORDER — SODIUM CHLORIDE 0.9 % IV BOLUS (SEPSIS)
1000.0000 mL | Freq: Once | INTRAVENOUS | Status: AC
  Administered 2011-09-13: 1000 mL via INTRAVENOUS

## 2011-09-13 NOTE — ED Provider Notes (Signed)
Pt is alert, clear speech, ambulates without assistance. Does not want rehab and would like to be d/c'd home.   Loren Racer, MD 09/13/11 [phone]

## 2011-09-13 NOTE — ED Notes (Signed)
Pt had no IV upon discharge, pt states prior RN had taken it out.  IV d/c in system by this RN.

## 2011-09-13 NOTE — Discharge Instructions (Signed)
Chronic Alcoholism  Alcoholism is an addiction to alcohol. Addiction is a medical illness. It is not an one-time incident of heavy drinking that defines the disease of alcoholism.   The characteristics of addictive disease, such as alcoholism, include behaviors that the person finds pleasurable, at least initially. In alcohol addiction, drinking causes chemical changes in brain activity. This can lead to frequent cravings for alcohol. Unfortunately over time, an increased amount of alcohol is needed to produce the pleasure (tolerance). As a result, the person will start to feel uncomfortable symptoms when he or she is not drinking (withdrawal).  Over time, a bad cycle develops. When painful withdrawal symptoms start to appear, alcohol is needed to make the symptoms go away. During this process, the person addicted to alcohol may become so used to the effects of alcohol that the usual signs of intoxication such as slurred speech, a staggering walk, or sleepiness may no longer be shown. Many people addicted to alcohol are able to function and even complete tasks.  CAUSES    Drinking heavily and frequently.   Other factors like genetics.  SYMPTOMS    Headaches.   Frequent trouble falling or staying asleep (insomnia).   Irritability.   Uncontrolled shaking or movement (tremors).   Forgetting events (brownouts) or passing out (blackouts).   Seizures or hallucinations (delirium tremens).   Problems at work or at home that are related to drinking.   Medical problems related to drinking such as heart disease, stroke, high blood pressure, diabetes, stomach ulcers, bleeding from the GI tract, and liver failure.   Trauma (falls, broken bones, automobile crashes).  TREATMENT   Alcoholism usually gets worse over time and almost never gets better without treatment. Your caregiver can help recommend a course of treatment for you depending on how severe your symptoms are and the level of your alcohol abuse. In some  patients, stopping alcohol use or even decreasing use can bring about withdrawal symptoms that are dangerous or even deadly. For this reason, hospitalization is sometimes required to medically stabilize a patient.   If hospitalization is not required, but the risk of withdrawal is high, a detoxification (detox) facility may be recommended as an initial treatment step. In a detox center, medications can be given to protect against seizures and other withdrawal symptoms.  Rehabilitation treatment may also be necessary. This is the process of treating the psychological and lifestyle element of addiction. There is both inpatient and outpatient rehabilitation treatment. Inpatient programs help patients through a systematic plan of psychological questioning, both individually and in groups, and at times using a "twelve-step" format. Outpatient rehabilitation programs have a similar structure and are aimed at promoting continued sobriety and preventing relapse into addiction.  Make treatment decisions together with your caregiver.  HOME CARE INSTRUCTIONS    If you are concerned about your alcohol use, talk to someone who can help. Trying to quit on your own is not easy. It can even be medically dangerous. This is especially true if you have been drinking heavily for a long time.   Call your caregiver, Alcoholics Anonymous, or other alcoholic treatment programs for help.   AL-ANON and ALA-TEEN are support groups for friends and family members of an alcohol or drug dependent person. These people also often need help too. For information about these organizations, check your phone directory or the internet. You can also call a local alcohol or chemical dependency treatment center.  Document Released: 07/09/2004 Document Revised: 05/21/2011 Document Reviewed: 11/15/2009  ExitCare 

## 2011-10-12 ENCOUNTER — Emergency Department (HOSPITAL_COMMUNITY)
Admission: EM | Admit: 2011-10-12 | Discharge: 2011-10-12 | Discharge status: Against Medical Advice | Payer: Self-pay | Attending: Emergency Medicine | Admitting: Emergency Medicine

## 2011-10-12 DIAGNOSIS — Z0389 Encounter for observation for other suspected diseases and conditions ruled out: Secondary | ICD-10-CM | POA: Insufficient documentation

## 2012-03-07 ENCOUNTER — Emergency Department (HOSPITAL_COMMUNITY)
Admission: EM | Admit: 2012-03-07 | Discharge: 2012-03-08 | Disposition: A | Discharge status: Rehabilitation Facility | Payer: Self-pay | Attending: Emergency Medicine | Admitting: Emergency Medicine

## 2012-03-07 ENCOUNTER — Encounter (HOSPITAL_COMMUNITY): Payer: Self-pay | Admitting: *Deleted

## 2012-03-07 DIAGNOSIS — F411 Generalized anxiety disorder: Secondary | ICD-10-CM | POA: Insufficient documentation

## 2012-03-07 DIAGNOSIS — K219 Gastro-esophageal reflux disease without esophagitis: Secondary | ICD-10-CM | POA: Insufficient documentation

## 2012-03-07 DIAGNOSIS — F3289 Other specified depressive episodes: Secondary | ICD-10-CM | POA: Insufficient documentation

## 2012-03-07 DIAGNOSIS — F329 Major depressive disorder, single episode, unspecified: Secondary | ICD-10-CM | POA: Insufficient documentation

## 2012-03-07 DIAGNOSIS — F1010 Alcohol abuse, uncomplicated: Secondary | ICD-10-CM | POA: Insufficient documentation

## 2012-03-07 DIAGNOSIS — F17200 Nicotine dependence, unspecified, uncomplicated: Secondary | ICD-10-CM | POA: Insufficient documentation

## 2012-03-07 LAB — COMPREHENSIVE METABOLIC PANEL
Albumin: 2.7 g/dL — ABNORMAL LOW (ref 3.5–5.2)
BUN: 11 mg/dL (ref 6–23)
Calcium: 8 mg/dL — ABNORMAL LOW (ref 8.4–10.5)
Creatinine, Ser: 0.95 mg/dL (ref 0.50–1.35)
Total Bilirubin: 1.1 mg/dL (ref 0.3–1.2)
Total Protein: 6.8 g/dL (ref 6.0–8.3)

## 2012-03-07 LAB — ETHANOL: Alcohol, Ethyl (B): 362 mg/dL — ABNORMAL HIGH (ref 0–11)

## 2012-03-07 LAB — SALICYLATE LEVEL: Salicylate Lvl: <2 mg/dL — ABNORMAL LOW (ref 2.8–20.0)

## 2012-03-07 LAB — CBC
HCT: 33.9 % — ABNORMAL LOW (ref 39.0–52.0)
MCH: 28.8 pg (ref 26.0–34.0)
MCHC: 34.8 g/dL (ref 30.0–36.0)
MCV: 82.7 fL (ref 78.0–100.0)
Platelets: 227 10*3/uL (ref 150–400)
RDW: 16.9 % — ABNORMAL HIGH (ref 11.5–15.5)

## 2012-03-07 MED ORDER — ADULT MULTIVITAMIN W/MINERALS CH
1.0000 | ORAL_TABLET | Freq: Every day | ORAL | Status: DC
  Administered 2012-03-08: 1 via ORAL
  Filled 2012-03-07: qty 1

## 2012-03-07 MED ORDER — LORAZEPAM 2 MG/ML IJ SOLN: 1.0000 mg | Freq: Four times a day (QID) | INTRAMUSCULAR | Status: DC | PRN

## 2012-03-07 MED ORDER — FOLIC ACID 1 MG PO TABS
1.0000 mg | ORAL_TABLET | Freq: Every day | ORAL | Status: DC
  Administered 2012-03-08: 1 mg via ORAL
  Filled 2012-03-07: qty 1

## 2012-03-07 MED ORDER — VITAMIN B-1 100 MG PO TABS
100.0000 mg | ORAL_TABLET | Freq: Every day | ORAL | Status: DC
  Administered 2012-03-08: 100 mg via ORAL
  Filled 2012-03-07: qty 1

## 2012-03-07 MED ORDER — ONDANSETRON HCL 4 MG PO TABS
4.0000 mg | ORAL_TABLET | Freq: Three times a day (TID) | ORAL | Status: DC | PRN
  Administered 2012-03-07 – 2012-03-08 (×2): 4 mg via ORAL
  Filled 2012-03-07 (×2): qty 1

## 2012-03-07 MED ORDER — THIAMINE HCL 100 MG/ML IJ SOLN: 100.0000 mg | Freq: Every day | INTRAMUSCULAR | Status: DC

## 2012-03-07 MED ORDER — LORAZEPAM 1 MG PO TABS
1.0000 mg | ORAL_TABLET | Freq: Four times a day (QID) | ORAL | Status: DC | PRN
  Administered 2012-03-07 – 2012-03-08 (×2): 1 mg via ORAL
  Filled 2012-03-07 (×2): qty 1

## 2012-03-07 MED ORDER — NICOTINE 21 MG/24HR TD PT24
21.0000 mg | MEDICATED_PATCH | Freq: Every day | TRANSDERMAL | Status: DC
  Filled 2012-03-07: qty 1

## 2012-03-07 NOTE — ED Notes (Signed)
[employer] called EMS: pt is intoxicated. Pt wants detox. Pt is no cooperative with questions. Pt is not combative but very irritable. Pt states he drank beer tonight.

## 2012-03-07 NOTE — ED Notes (Signed)
Pt had a sea shell necklace on it was placed in a bag and put with his other personal items in the locker.

## 2012-03-07 NOTE — ED Provider Notes (Signed)
History     CSN: [id number]  Arrival date & time 03/07/12  1931   First MD Initiated Contact with Patient 03/07/12 2245    level 5 caveat intoxication  Chief Complaint  Patient presents with  . Medical Clearance    (Consider location/radiation/quality/duration/timing/severity/associated sxs/prior treatment) HPI 41 y.o. Male with history alcohol abuse last intake today.  History of alcohol abuse since age adolescence.  History of detox a year ago with longest sobriety four months.  History of cocaine abuse, last smoked two days ago.  Patient working for [employer] but now gone.  Patient intoxicated and difficult to obtain full history.  Past Medical History  Diagnosis Date  . Alcohol abuse   . Anxiety   . Depression   . Acid reflux     Past Surgical History  Procedure Date  . Alcohol abuse   . Left leg surgery   . Facial reconstruction surgery     d/t facial fx's    History reviewed. No pertinent family history.  History  Substance Use Topics  . Smoking status: Current Every Day Smoker -- 0.5 packs/day    Types: Cigarettes  . Smokeless tobacco: Not on file  . Alcohol Use: Yes     drink wine constantly all day      Review of Systems  Unable to perform ROS   Allergies  Review of patient's allergies indicates no known allergies.  Home Medications  No current outpatient prescriptions on file.  BP 125/83  Pulse 98  Temp 99 F (37.2 C) (Oral)  Resp 18  SpO2 98%  Physical Exam  Nursing note and vitals reviewed. Constitutional: He is oriented to person, place, and time. He appears well-developed and well-nourished.  HENT:  Head: Normocephalic and atraumatic.  Right Ear: External ear normal.  Left Ear: External ear normal.  Nose: Nose normal.  Mouth/Throat: Oropharynx is clear and moist.  Eyes: Conjunctivae normal and EOM are normal. Pupils are equal, round, and reactive to light.  Neck: Normal range of motion. Neck supple.  Cardiovascular: Normal rate,  regular rhythm, normal heart sounds and intact distal pulses.   Pulmonary/Chest: Effort normal and breath sounds normal. No respiratory distress. He has no wheezes. He exhibits no tenderness.  Abdominal: Soft. Bowel sounds are normal. He exhibits no distension and no mass. There is no tenderness. There is no guarding.  Musculoskeletal: Normal range of motion.  Neurological: He is alert and oriented to person, place, and time. He has normal reflexes. He exhibits normal muscle tone. Coordination normal.  Skin: Skin is warm and dry.    ED Course  Procedures (including critical care time)  Labs Reviewed  CBC - Abnormal; Notable for the following:    WBC 3.7 (*)     RBC 4.10 (*)     Hemoglobin 11.8 (*)     HCT 33.9 (*)     RDW 16.9 (*)     All other components within normal limits  COMPREHENSIVE METABOLIC PANEL - Abnormal; Notable for the following:    Sodium 132 (*)     Potassium 3.4 (*)     Glucose, Bld 109 (*)     Calcium 8.0 (*)     Albumin 2.7 (*)     AST 158 (*)     ALT 115 (*)     Alkaline Phosphatase 260 (*)     All other components within normal limits  ETHANOL - Abnormal; Notable for the following:    Alcohol, Ethyl (B) 362 (*)  All other components within normal limits  SALICYLATE LEVEL - Abnormal; Notable for the following:    Salicylate Lvl <2.0 (*)     All other components within normal limits  ACETAMINOPHEN LEVEL  URINE RAPID DRUG SCREEN (HOSP PERFORMED)   No results found.   No diagnosis found.    Results for orders placed during the hospital encounter of 03/07/12  ACETAMINOPHEN LEVEL      Component Value Range   Acetaminophen (Tylenol), Serum <15.0  10 - 30 ug/mL  CBC      Component Value Range   WBC 3.7 (*) 4.0 - 10.5 K/uL   RBC 4.10 (*) 4.22 - 5.81 MIL/uL   Hemoglobin 11.8 (*) 13.0 - 17.0 g/dL   HCT 16.1 (*) 09.6 - 04.5 %   MCV 82.7  78.0 - 100.0 fL   MCH 28.8  26.0 - 34.0 pg   MCHC 34.8  30.0 - 36.0 g/dL   RDW 40.9 (*) 81.1 - 91.4 %    Platelets 227  150 - 400 K/uL  COMPREHENSIVE METABOLIC PANEL      Component Value Range   Sodium 132 (*) 135 - 145 mEq/L   Potassium 3.4 (*) 3.5 - 5.1 mEq/L   Chloride 97  96 - 112 mEq/L   CO2 22  19 - 32 mEq/L   Glucose, Bld 109 (*) 70 - 99 mg/dL   BUN 11  6 - 23 mg/dL   Creatinine, Ser 7.82  0.50 - 1.35 mg/dL   Calcium 8.0 (*) 8.4 - 10.5 mg/dL   Total Protein 6.8  6.0 - 8.3 g/dL   Albumin 2.7 (*) 3.5 - 5.2 g/dL   AST 956 (*) 0 - 37 U/L   ALT 115 (*) 0 - 53 U/L   Alkaline Phosphatase 260 (*) 39 - 117 U/L   Total Bilirubin 1.1  0.3 - 1.2 mg/dL   GFR calc non Af Amer >90  >90 mL/min   GFR calc Af Amer >90  >90 mL/min  ETHANOL      Component Value Range   Alcohol, Ethyl (B) 362 (*) 0 - 11 mg/dL  SALICYLATE LEVEL      Component Value Range   Salicylate Lvl <2.0 (*) 2.8 - 20.0 mg/dL       Patient moved to psych ed for further assessment by ACT    Hilario Quarry, MD 03/08/12 2001

## 2012-03-08 LAB — RAPID URINE DRUG SCREEN, HOSP PERFORMED
Benzodiazepines: NOT DETECTED
Cocaine: NOT DETECTED
Opiates: NOT DETECTED

## 2012-03-08 MED ORDER — LORAZEPAM 1 MG PO TABS: 0.0000 mg | ORAL_TABLET | Freq: Two times a day (BID) | ORAL | Status: DC

## 2012-03-08 MED ORDER — LORAZEPAM 1 MG PO TABS
0.0000 mg | ORAL_TABLET | Freq: Four times a day (QID) | ORAL | Status: DC
  Administered 2012-03-08 (×2): 1 mg via ORAL
  Filled 2012-03-08 (×2): qty 1

## 2012-03-08 NOTE — BH Assessment (Addendum)
Assessment Note   Kevin [name] is an 41 y.o. male that has just been accepted to [facility] for inpatient treatment for ETOH detox and Cannibus abuse.  Pt reports also abusing Cocaine recently, last use two days ago.  Pt has probably minimizes his overall use.  Pt has a history of treatment at [facility] last year, which he cannot remember.  Writer contacted [facility] and completed a referral screening. Rayna Sexton at [facility] also requested authorization from [facility].  Contacted [employer] and spoke with Teena Irani to obtain authorization.  Auth number is:  3 days- review on the 26th.  [id number].    Axis I: Substance Abuse versus Substance Dependence Axis II: Deferred Axis III:  Past Medical History  Diagnosis Date  . Alcohol abuse   . Anxiety   . Depression   . Acid reflux    Axis IV: economic problems, housing problems, other psychosocial or environmental problems, problems with access to health care services and problems with primary support group Axis V: 31-40 impairment in reality testing  Past Medical History:  Past Medical History  Diagnosis Date  . Alcohol abuse   . Anxiety   . Depression   . Acid reflux     Past Surgical History  Procedure Date  . Alcohol abuse   . Left leg surgery   . Facial reconstruction surgery     d/t facial fx's    Family History: History reviewed. No pertinent family history.  Social History:  reports that he has been smoking Cigarettes.  He has been smoking about .5 packs per day. He does not have any smokeless tobacco history on file. He reports that he drinks alcohol. He reports that he uses illicit drugs (Marijuana).  Additional Social History:  Substance #1 Name of Substance 1: Alcohol 1 - Age of First Use: 41 years old 1 - Amount (size/oz): Patient reports that he drinks 3 or more 40oz of beer. 1 - Frequency: Daily  1 - Duration: For the past 4 months 1 - Last Use / Amount: Today.  Patient was not able to remember the time that he last drank alcohol.    CIWA: CIWA-Ar BP: 128/94 mmHg Pulse Rate: 103  Nausea and Vomiting: mild nausea with no vomiting Tactile Disturbances: very mild itching, pins and needles, burning or numbness Tremor: three Auditory Disturbances: not present Paroxysmal Sweats: no sweat visible Visual Disturbances: not present Anxiety: mildly anxious Headache, Fullness in Head: none present Agitation: normal activity Orientation and Clouding of Sensorium: oriented and can do serial additions CIWA-Ar Total: 6  COWS:    Allergies: No Known Allergies  Home Medications:  (Not in a hospital admission)  OB/GYN Status:  No LMP for male patient.  General Assessment Data Location of Assessment: [facility] ED ACT Assessment: Yes Living Arrangements: Other relatives Can pt return to current living arrangement?: Yes Admission Status: Voluntary Is patient capable of signing voluntary admission?: Yes Transfer from: Acute Hospital Referral Source: Self/Family/Friend  Education Status Is patient currently in school?: No  Risk to self Suicidal Ideation: No Suicidal Intent: No Is patient at risk for suicide?: No Suicidal Plan?: No-Not Currently/Within Last 6 Months Access to Means: No What has been your use of drugs/alcohol within the last 12 months?: ETOH Previous Attempts/Gestures: No How many times?: 0  Other Self Harm Risks: none Triggers for Past Attempts: None known Intentional Self Injurious Behavior: None Family Suicide History: No Recent stressful life event(s): Conflict (Comment);Financial Problems;Loss (Comment);Other (Comment) (homelessness) Persecutory voices/beliefs?: No Depression: Yes Depression Symptoms:  Feeling worthless/self pity;Loss of interest in usual pleasures;Guilt;Fatigue;Despondent Substance abuse history and/or treatment for substance abuse?: Yes Suicide prevention information given to non-admitted patients: Not applicable  Risk to Others Homicidal Ideation: No Thoughts of Harm to  Others: No Current Homicidal Intent: No-Not Currently/Within Last 6 Months Current Homicidal Plan: No Access to Homicidal Means: No Identified Victim: none per pt History of harm to others?: No Assessment of Violence: None Noted Violent Behavior Description: none per pt Does patient have access to weapons?: No Criminal Charges Pending?: Yes Describe Pending Criminal Charges: Littering and Public Intoxication Does patient have a court date: Yes Court Date: 04/01/12  Psychosis Hallucinations: None noted Delusions: None noted  Mental Status Report Appear/Hygiene: Disheveled Eye Contact: Good Motor Activity: Unremarkable Speech: Logical/coherent Level of Consciousness: Alert Mood: Helpless;Depressed Affect: Appropriate to circumstance Anxiety Level: Minimal Thought Processes: Relevant Judgement: Unimpaired Orientation: Person;Place;Time;Situation Obsessive Compulsive Thoughts/Behaviors: Moderate  Cognitive Functioning Concentration: Decreased Memory: Recent Intact;Remote Impaired IQ: Average Insight: Fair Impulse Control: Poor Appetite: Poor Weight Loss: 0  Weight Gain: 0  Sleep: Decreased Total Hours of Sleep: 5  Vegetative Symptoms: None  ADLScreening [facility] Assessment Services) Patient's cognitive ability adequate to safely complete daily activities?: Yes Patient able to express need for assistance with ADLs?: Yes Independently performs ADLs?: Yes (appropriate for developmental age)  Abuse/Neglect [facility]) Physical Abuse: Denies Verbal Abuse: Denies Sexual Abuse: Denies  Prior Inpatient Therapy Prior Inpatient Therapy: Yes Prior Therapy Dates: unable to remember  Prior Therapy Facilty/Provider(s): unable to remeber  Reason for Treatment: substance abuse   Prior Outpatient Therapy Prior Outpatient Therapy: No Prior Therapy Dates: No Prior Therapy Facilty/Provider(s): N o  Reason for Treatment: No  ADL Screening (condition at time of admission) Patient's  cognitive ability adequate to safely complete daily activities?: Yes Patient able to express need for assistance with ADLs?: Yes Independently performs ADLs?: Yes (appropriate for developmental age)       Abuse/Neglect Assessment (Assessment to be complete while patient is alone) Physical Abuse: Denies Verbal Abuse: Denies Sexual Abuse: Denies Values / Beliefs Cultural Requests During Hospitalization: None Spiritual Requests During Hospitalization: None        Additional Information 1:1 In Past 12 Months?: No CIRT Risk: No Elopement Risk: No Does patient have medical clearance?: Yes     Disposition:  Accepted at [facility].  Dr. And nursing staff notified.  Awaiting transfer. Disposition Disposition of Patient: Inpatient treatment program;Other dispositions Type of inpatient treatment program:  ([facility]) Other disposition(s): Referred to outside facility  On Site Evaluation by:   Reviewed with Physician:     Angelica Ran 03/08/2012 3:58 PM

## 2012-03-08 NOTE — BH Assessment (Signed)
Assessment Note   Kevin [name] is an 41 y.o. male.   Patient is a 41 year old African American male.  Patient requests detox services.  Patient reports that he drinks 3-4 (40oz) of beer daily. Patient reports that he is not able to stop using alcohol on his own. Patient reports feelings of helplessness due to his inability to using alcohol.  Patient reports a past history of detox and treatment facilities.  Patient reports that he is not able to remember all of the names.  Patient reports that he does remember going to [facility] for detox and treatment last year.  Patient reports that he has withdrawal symptoms of shaking, nausea and tremors in his body.   Patient has a CIWA score of 7.   On 03-07-2012 at 2030 the patient had a BAL of 362.    Patient denies any psychosis.  Patient denies any SI/HI. Patient denies a prior history outpatient mental health counseling.  Patient denies any psychiatric hospitalizations. Patient reports that he has a pending court date of April 11, 2012 for littering and being drunk in a public setting.          Past Medical History:  Past Medical History  Diagnosis Date  . Alcohol abuse   . Anxiety   . Depression   . Acid reflux     Past Surgical History  Procedure Date  . Alcohol abuse   . Left leg surgery   . Facial reconstruction surgery     d/t facial fx's    Family History: History reviewed. No pertinent family history.  Social History:  reports that he has been smoking Cigarettes.  He has been smoking about .5 packs per day. He does not have any smokeless tobacco history on file. He reports that he drinks alcohol. He reports that he uses illicit drugs (Marijuana).  Additional Social History:  Substance #1 Name of Substance 1: Alcohol 1 - Age of First Use: 41 years old 1 - Amount (size/oz): Patient reports that he drinks 3 or more 40oz of beer. 1 - Frequency: Daily  1 - Duration: For the past 4 months 1 - Last Use / Amount: Today.  Patient was  not able to remember the time that he last drank alcohol.   CIWA: CIWA-Ar BP: 122/79 mmHg Pulse Rate: 76  Nausea and Vomiting: mild nausea with no vomiting Tactile Disturbances: very mild itching, pins and needles, burning or numbness Tremor: not visible, but can be felt fingertip to fingertip Auditory Disturbances: not present Paroxysmal Sweats: barely perceptible sweating, palms moist Visual Disturbances: not present Anxiety: mildly anxious Headache, Fullness in Head: very mild Agitation: somewhat more than normal activity Orientation and Clouding of Sensorium: oriented and can do serial additions CIWA-Ar Total: 7  COWS:    Allergies: No Known Allergies  Home Medications:  (Not in a hospital admission)  OB/GYN Status:  No LMP for male patient.  General Assessment Data Location of Assessment: [facility] ACT Assessment: Yes Living Arrangements: Other relatives Can pt return to current living arrangement?: Yes Admission Status: Voluntary Is patient capable of signing voluntary admission?: Yes Transfer from: Acute Hospital Referral Source: Self/Family/Friend     Risk to self Suicidal Ideation: No Suicidal Intent: No Is patient at risk for suicide?: No Suicidal Plan?: No Access to Means: No What has been your use of drugs/alcohol within the last 12 months?: Alcohol  Previous Attempts/Gestures: No How many times?: 0  Other Self Harm Risks: None  Triggers for Past  Attempts: None known Intentional Self Injurious Behavior: None Family Suicide History: No Recent stressful life event(s): Conflict (Comment);Financial Problems;Other (Comment) Persecutory voices/beliefs?: Yes Depression: Yes Depression Symptoms: Despondent;Fatigue;Loss of interest in usual pleasures;Feeling worthless/self pity Substance abuse history and/or treatment for substance abuse?: Yes Suicide prevention information given to non-admitted patients: Not applicable  Risk to Others Homicidal Ideation:  No-Not Currently/Within Last 6 Months Thoughts of Harm to Others: No Current Homicidal Intent: No-Not Currently/Within Last 6 Months Current Homicidal Plan: No Access to Homicidal Means: No Identified Victim: None  History of harm to others?: No Assessment of Violence: None Noted Violent Behavior Description: None  Does patient have access to weapons?: No Criminal Charges Pending?: Yes Describe Pending Criminal Charges: Littering and Being drunk in a public setting.  Does patient have a court date: Yes Court Date: 04/01/12  Psychosis Hallucinations: None noted Delusions: None noted  Mental Status Report Appear/Hygiene: Disheveled Eye Contact: Poor Motor Activity: Tremors Speech: Logical/coherent Level of Consciousness: Alert Mood: Depressed Affect: Appropriate to circumstance Anxiety Level: Minimal Thought Processes: Coherent;Relevant Judgement: Unimpaired Orientation: Person;Place;Time;Situation Obsessive Compulsive Thoughts/Behaviors: None  Cognitive Functioning Concentration: Decreased Memory: Recent Intact;Remote Intact IQ: Average Insight: Fair Impulse Control: Poor Appetite: Poor Weight Loss: 0  Weight Gain: 0  Sleep: Decreased Total Hours of Sleep: 5  Vegetative Symptoms: None  ADLScreening [facility] Assessment Services) Patient's cognitive ability adequate to safely complete daily activities?: Yes Patient able to express need for assistance with ADLs?: Yes Independently performs ADLs?: Yes (appropriate for developmental age)  Abuse/Neglect [facility]) Physical Abuse: Denies, provider concerned (Comment) Verbal Abuse: Denies, provider concerned (Comment) Sexual Abuse: Denies, provider concered (Comment)  Prior Inpatient Therapy Prior Inpatient Therapy: Yes Prior Therapy Dates: unable to remember  Prior Therapy Facilty/Provider(s): unable to remeber  Reason for Treatment: substance abuse   Prior Outpatient Therapy Prior Outpatient Therapy: No Prior Therapy  Dates: No Prior Therapy Facilty/Provider(s): N o  Reason for Treatment: No  ADL Screening (condition at time of admission) Patient's cognitive ability adequate to safely complete daily activities?: Yes Patient able to express need for assistance with ADLs?: Yes Independently performs ADLs?: Yes (appropriate for developmental age)       Abuse/Neglect Assessment (Assessment to be complete while patient is alone) Physical Abuse: Denies, provider concerned (Comment) Verbal Abuse: Denies, provider concerned (Comment) Sexual Abuse: Denies, provider concered (Comment) Values / Beliefs Cultural Requests During Hospitalization: None Spiritual Requests During Hospitalization: None        Additional Information 1:1 In Past 12 Months?: No CIRT Risk: No Elopement Risk: No Does patient have medical clearance?: Yes     Disposition: Pending [facility]  Disposition Disposition of Patient:  (Pending [facility] )  On Site Evaluation by:   Reviewed with Physician:     Phillip Heal LaVerne 03/08/2012 5:12 AM

## 2012-12-24 ENCOUNTER — Emergency Department (HOSPITAL_COMMUNITY)
Admission: EM | Admit: 2012-12-24 | Discharge: 2012-12-26 | Disposition: A | Discharge status: Home / Self Care | Payer: Self-pay | Attending: Emergency Medicine | Admitting: Emergency Medicine

## 2012-12-24 ENCOUNTER — Encounter (HOSPITAL_COMMUNITY): Payer: Self-pay | Admitting: Emergency Medicine

## 2012-12-24 DIAGNOSIS — F3289 Other specified depressive episodes: Secondary | ICD-10-CM | POA: Insufficient documentation

## 2012-12-24 DIAGNOSIS — F1010 Alcohol abuse, uncomplicated: Secondary | ICD-10-CM

## 2012-12-24 DIAGNOSIS — M7989 Other specified soft tissue disorders: Secondary | ICD-10-CM | POA: Insufficient documentation

## 2012-12-24 DIAGNOSIS — M2550 Pain in unspecified joint: Secondary | ICD-10-CM | POA: Insufficient documentation

## 2012-12-24 DIAGNOSIS — F1910 Other psychoactive substance abuse, uncomplicated: Secondary | ICD-10-CM

## 2012-12-24 DIAGNOSIS — R259 Unspecified abnormal involuntary movements: Secondary | ICD-10-CM | POA: Insufficient documentation

## 2012-12-24 DIAGNOSIS — F419 Anxiety disorder, unspecified: Secondary | ICD-10-CM

## 2012-12-24 DIAGNOSIS — R4587 Impulsiveness: Secondary | ICD-10-CM | POA: Insufficient documentation

## 2012-12-24 DIAGNOSIS — F329 Major depressive disorder, single episode, unspecified: Secondary | ICD-10-CM

## 2012-12-24 DIAGNOSIS — F17200 Nicotine dependence, unspecified, uncomplicated: Secondary | ICD-10-CM | POA: Insufficient documentation

## 2012-12-24 DIAGNOSIS — F32A Depression, unspecified: Secondary | ICD-10-CM

## 2012-12-24 DIAGNOSIS — F411 Generalized anxiety disorder: Secondary | ICD-10-CM | POA: Insufficient documentation

## 2012-12-24 DIAGNOSIS — Z8719 Personal history of other diseases of the digestive system: Secondary | ICD-10-CM | POA: Insufficient documentation

## 2012-12-24 DIAGNOSIS — G4700 Insomnia, unspecified: Secondary | ICD-10-CM | POA: Insufficient documentation

## 2012-12-24 DIAGNOSIS — R6884 Jaw pain: Secondary | ICD-10-CM | POA: Insufficient documentation

## 2012-12-24 LAB — COMPREHENSIVE METABOLIC PANEL
ALT: 45 U/L (ref 0–53)
Alkaline Phosphatase: 103 U/L (ref 39–117)
BUN: 12 mg/dL (ref 6–23)
CO2: 26 mEq/L (ref 19–32)
Chloride: 101 mEq/L (ref 96–112)
GFR calc Af Amer: >90 mL/min (ref >90)
GFR calc non Af Amer: >90 mL/min (ref >90)
Glucose, Bld: 109 mg/dL — ABNORMAL HIGH (ref 70–99)
Potassium: 3.6 mEq/L (ref 3.5–5.1)
Sodium: 138 mEq/L (ref 135–145)
Total Bilirubin: 0.2 mg/dL — ABNORMAL LOW (ref 0.3–1.2)
Total Protein: 7.3 g/dL (ref 6.0–8.3)

## 2012-12-24 LAB — RAPID URINE DRUG SCREEN, HOSP PERFORMED
Barbiturates: NOT DETECTED
Tetrahydrocannabinol: POSITIVE — AB

## 2012-12-24 LAB — CBC
HCT: 34.1 % — ABNORMAL LOW (ref 39.0–52.0)
Hemoglobin: 11.7 g/dL — ABNORMAL LOW (ref 13.0–17.0)
MCHC: 34.3 g/dL (ref 30.0–36.0)
RBC: 3.96 MIL/uL — ABNORMAL LOW (ref 4.22–5.81)

## 2012-12-24 LAB — ETHANOL: Alcohol, Ethyl (B): 266 mg/dL — ABNORMAL HIGH (ref 0–11)

## 2012-12-24 LAB — ACETAMINOPHEN LEVEL: Acetaminophen (Tylenol), Serum: <15 ug/mL (ref 10–30)

## 2012-12-24 MED ORDER — THIAMINE HCL 100 MG/ML IJ SOLN: 100.0000 mg | Freq: Every day | INTRAMUSCULAR | Status: DC

## 2012-12-24 MED ORDER — CLONIDINE HCL 0.1 MG PO TABS: 0.1000 mg | ORAL_TABLET | Freq: Every day | ORAL | Status: DC

## 2012-12-24 MED ORDER — METHOCARBAMOL 500 MG PO TABS: 500.0000 mg | ORAL_TABLET | Freq: Three times a day (TID) | ORAL | Status: DC | PRN

## 2012-12-24 MED ORDER — DICYCLOMINE HCL 20 MG PO TABS: 20.0000 mg | ORAL_TABLET | Freq: Four times a day (QID) | ORAL | Status: DC | PRN

## 2012-12-24 MED ORDER — ALUM & MAG HYDROXIDE-SIMETH 200-200-20 MG/5ML PO SUSP: 30.0000 mL | ORAL | Status: DC | PRN

## 2012-12-24 MED ORDER — ONDANSETRON 4 MG PO TBDP: 4.0000 mg | ORAL_TABLET | Freq: Four times a day (QID) | ORAL | Status: DC | PRN

## 2012-12-24 MED ORDER — ZOLPIDEM TARTRATE 5 MG PO TABS: 5.0000 mg | ORAL_TABLET | Freq: Every evening | ORAL | Status: DC | PRN

## 2012-12-24 MED ORDER — LORAZEPAM 1 MG PO TABS: 0.0000 mg | ORAL_TABLET | Freq: Two times a day (BID) | ORAL | Status: DC

## 2012-12-24 MED ORDER — FOLIC ACID 1 MG PO TABS
1.0000 mg | ORAL_TABLET | Freq: Every day | ORAL | Status: DC
  Administered 2012-12-25 – 2012-12-26 (×2): 1 mg via ORAL
  Filled 2012-12-24 (×2): qty 1

## 2012-12-24 MED ORDER — LORAZEPAM 1 MG PO TABS: 0.0000 mg | ORAL_TABLET | Freq: Four times a day (QID) | ORAL | Status: DC

## 2012-12-24 MED ORDER — HYDROXYZINE HCL 25 MG PO TABS: 25.0000 mg | ORAL_TABLET | Freq: Four times a day (QID) | ORAL | Status: DC | PRN

## 2012-12-24 MED ORDER — LOPERAMIDE HCL 2 MG PO CAPS: 2.0000 mg | ORAL_CAPSULE | ORAL | Status: DC | PRN

## 2012-12-24 MED ORDER — CLONIDINE HCL 0.1 MG PO TABS: 0.1000 mg | ORAL_TABLET | ORAL | Status: DC

## 2012-12-24 MED ORDER — NAPROXEN 500 MG PO TABS: 500.0000 mg | ORAL_TABLET | Freq: Two times a day (BID) | ORAL | Status: DC | PRN

## 2012-12-24 MED ORDER — ADULT MULTIVITAMIN W/MINERALS CH
1.0000 | ORAL_TABLET | Freq: Every day | ORAL | Status: DC
Start: 2012-12-25 — End: 2012-12-26
  Administered 2012-12-25 – 2012-12-26 (×2): 1 via ORAL
  Filled 2012-12-24 (×2): qty 1

## 2012-12-24 MED ORDER — ONDANSETRON HCL 4 MG PO TABS: 4.0000 mg | ORAL_TABLET | Freq: Three times a day (TID) | ORAL | Status: DC | PRN

## 2012-12-24 MED ORDER — IBUPROFEN 200 MG PO TABS: 600.0000 mg | ORAL_TABLET | Freq: Three times a day (TID) | ORAL | Status: DC | PRN

## 2012-12-24 MED ORDER — CLONIDINE HCL 0.1 MG PO TABS
0.1000 mg | ORAL_TABLET | Freq: Four times a day (QID) | ORAL | Status: DC
  Administered 2012-12-25: 0.1 mg via ORAL
  Filled 2012-12-24 (×2): qty 1

## 2012-12-24 MED ORDER — NICOTINE 21 MG/24HR TD PT24
21.0000 mg | MEDICATED_PATCH | Freq: Every day | TRANSDERMAL | Status: DC
  Administered 2012-12-25 – 2012-12-26 (×2): 21 mg via TRANSDERMAL
  Filled 2012-12-24 (×2): qty 1

## 2012-12-24 MED ORDER — LORAZEPAM 1 MG PO TABS
1.0000 mg | ORAL_TABLET | Freq: Three times a day (TID) | ORAL | Status: DC | PRN
  Administered 2012-12-25: 1 mg via ORAL
  Filled 2012-12-24: qty 1

## 2012-12-24 MED ORDER — VITAMIN B-1 100 MG PO TABS
100.0000 mg | ORAL_TABLET | Freq: Every day | ORAL | Status: DC
  Administered 2012-12-25 – 2012-12-26 (×2): 100 mg via ORAL
  Filled 2012-12-24 (×2): qty 1

## 2012-12-24 NOTE — ED Notes (Signed)
Patient states that he wants detox but he also wants to get off the crack and the drugs

## 2012-12-24 NOTE — ED Notes (Addendum)
Pt reports wanting detox from marijuana, crack, and alcohol.  Reports last using crack and alcohol today.  Pt sts "Im just confused"

## 2012-12-24 NOTE — ED Provider Notes (Signed)
History    CSN: [id number] Arrival date & time 12/24/12  2119  First MD Initiated Contact with Patient 12/24/12 2155     Chief Complaint  Patient presents with  . Jaw Pain  . Leg Pain  . Medical Clearance   (Consider location/radiation/quality/duration/timing/severity/associated sxs/prior Treatment) Patient is a 42 y.o. male presenting with leg pain. The history is provided by the patient and medical records. No language interpreter was used.  Leg Pain Associated symptoms: no back pain, no fatigue and no fever     Kevin [name] is a 42 y.o. male  with a hx of alcohol abuse, anxiety, depression, substance abuse presents to the Emergency Department requesting detox from his alcohol and drugs.  Patient states he drinks an unknown amount of alcohol every day. He also has used crack cocaine and marijuana today. Patient has associated pain in his jaw, leg and left arm all chronic pain from previous surgeries. Patient denies falling or further injury to any of the sites. Nothing seems to make the pain better or worse. He denies fever, chills, headache, neck pain, chest pain, shortness of breath, abdominal pain, nausea, vomiting, diarrhea weakness, dizziness, syncope.  Denies SI/HI, auditory and visual hallucinations.  Past Medical History  Diagnosis Date  . Alcohol abuse   . Anxiety   . Depression   . Acid reflux    Past Surgical History  Procedure Laterality Date  . Alcohol abuse    . Left leg surgery    . Facial reconstruction surgery      d/t facial fx's   History reviewed. No pertinent family history. History  Substance Use Topics  . Smoking status: Current Every Day Smoker -- 0.50 packs/day    Types: Cigarettes  . Smokeless tobacco: Not on file  . Alcohol Use: Yes     Comment: drink wine constantly all day    Review of Systems  Constitutional: Negative for fever, diaphoresis, appetite change, fatigue and unexpected weight change.  HENT: Negative for mouth sores  and neck stiffness.   Eyes: Negative for visual disturbance.  Respiratory: Negative for cough, chest tightness, shortness of breath and wheezing.   Cardiovascular: Negative for chest pain.  Gastrointestinal: Negative for nausea, vomiting, abdominal pain, diarrhea and constipation.  Endocrine: Negative for polydipsia, polyphagia and polyuria.  Genitourinary: Negative for dysuria, urgency, frequency and hematuria.  Musculoskeletal: Positive for arthralgias. Negative for back pain.  Skin: Negative for rash.  Allergic/Immunologic: Negative for immunocompromised state.  Neurological: Negative for syncope, light-headedness and headaches.  Hematological: Does not bruise/bleed easily.  Psychiatric/Behavioral: Negative for sleep disturbance. The patient is not nervous/anxious.     Allergies  Review of patient's allergies indicates no known allergies.  Home Medications  No current outpatient prescriptions on file. BP 158/114  Pulse 95  Temp(Src) 98 F (36.7 C) (Oral)  Resp 18  SpO2 98% Physical Exam  Nursing note and vitals reviewed. Constitutional: He is oriented to person, place, and time. He appears well-developed and well-nourished. No distress.  Awake, alert, nontoxic appearance  HENT:  Head: Normocephalic and atraumatic.  Mouth/Throat: Oropharynx is clear and moist. No oropharyngeal exudate.  Eyes: Conjunctivae are normal. Pupils are equal, round, and reactive to light. No scleral icterus.  Neck: Normal range of motion. Neck supple.  Cardiovascular: Normal rate, regular rhythm, normal heart sounds and intact distal pulses.   No murmur heard. Pulmonary/Chest: Effort normal and breath sounds normal. No respiratory distress. He has no wheezes. He has no rales.  Abdominal: Soft. Bowel  sounds are normal. He exhibits no mass. There is no tenderness. There is no rebound and no guarding.  Musculoskeletal: Normal range of motion. He exhibits no edema.  No deformity noted in the jaw, left  arm or bilateral legs Full range of motion of all major joints  Lymphadenopathy:    He has no cervical adenopathy.  Neurological: He is alert and oriented to person, place, and time. He exhibits normal muscle tone. Coordination normal.  Speech is clear and goal oriented Moves extremities without ataxia Patient ambulatory without difficulty  Skin: Skin is warm and dry. No rash noted. He is not diaphoretic. No erythema.  Psychiatric: He has a normal mood and affect.    ED Course  Procedures (including critical care time) Labs Reviewed  CBC - Abnormal; Notable for the following:    RBC 3.96 (*)    Hemoglobin 11.7 (*)    HCT 34.1 (*)    All other components within normal limits  COMPREHENSIVE METABOLIC PANEL - Abnormal; Notable for the following:    Glucose, Bld 109 (*)    Albumin 3.3 (*)    AST 50 (*)    Total Bilirubin 0.2 (*)    All other components within normal limits  ETHANOL - Abnormal; Notable for the following:    Alcohol, Ethyl (B) 266 (*)    All other components within normal limits  SALICYLATE LEVEL - Abnormal; Notable for the following:    Salicylate Lvl <2.0 (*)    All other components within normal limits  URINE RAPID DRUG SCREEN (HOSP PERFORMED) - Abnormal; Notable for the following:    Cocaine POSITIVE (*)    Tetrahydrocannabinol POSITIVE (*)    All other components within normal limits  ACETAMINOPHEN LEVEL   No results found. 1. Alcohol abuse   2. Substance abuse   3. Anxiety   4. Depression     MDM  Kevin [name] presents for detox.  Patient has been medically cleared in the ED and is awaiting consult by ACT team for possible placement vs OP clinic information. Pt is currently not having SI or HI and appears stable in NAD. Patient alcohol level 266 and will be cleared to be moved back to [facility] after this has decreased and patient is clinically sober.    Kevin Client Shaun Runyon, PA-C 12/25/12 [phone]

## 2012-12-24 NOTE — ED Notes (Signed)
Patient and belongings wanded by security.  

## 2012-12-25 ENCOUNTER — Encounter (HOSPITAL_COMMUNITY): Payer: Self-pay | Admitting: Registered Nurse

## 2012-12-25 DIAGNOSIS — F1010 Alcohol abuse, uncomplicated: Secondary | ICD-10-CM

## 2012-12-25 DIAGNOSIS — F10239 Alcohol dependence with withdrawal, unspecified: Secondary | ICD-10-CM

## 2012-12-25 MED ORDER — ADULT MULTIVITAMIN W/MINERALS CH: 1.0000 | ORAL_TABLET | Freq: Every day | ORAL | Status: DC

## 2012-12-25 MED ORDER — CHLORDIAZEPOXIDE HCL 25 MG PO CAPS
25.0000 mg | ORAL_CAPSULE | Freq: Once | ORAL | Status: AC
  Administered 2012-12-25: 50 mg via ORAL
  Filled 2012-12-25: qty 1

## 2012-12-25 MED ORDER — HYDROCHLOROTHIAZIDE 12.5 MG PO CAPS
25.0000 mg | ORAL_CAPSULE | Freq: Every day | ORAL | Status: DC
  Filled 2012-12-25: qty 2

## 2012-12-25 MED ORDER — CHLORDIAZEPOXIDE HCL 25 MG PO CAPS: 25.0000 mg | ORAL_CAPSULE | Freq: Every day | ORAL | Status: DC

## 2012-12-25 MED ORDER — CHLORDIAZEPOXIDE HCL 25 MG PO CAPS
25.0000 mg | ORAL_CAPSULE | Freq: Four times a day (QID) | ORAL | Status: DC | PRN
  Administered 2012-12-25: 25 mg via ORAL
  Filled 2012-12-25: qty 1

## 2012-12-25 MED ORDER — LOPERAMIDE HCL 2 MG PO CAPS: 2.0000 mg | ORAL_CAPSULE | ORAL | Status: DC | PRN

## 2012-12-25 MED ORDER — CHLORDIAZEPOXIDE HCL 25 MG PO CAPS
25.0000 mg | ORAL_CAPSULE | Freq: Three times a day (TID) | ORAL | Status: DC
  Administered 2012-12-26: 25 mg via ORAL
  Filled 2012-12-25: qty 1

## 2012-12-25 MED ORDER — HYDROXYZINE HCL 25 MG PO TABS
25.0000 mg | ORAL_TABLET | Freq: Four times a day (QID) | ORAL | Status: DC | PRN
  Administered 2012-12-25: 25 mg via ORAL
  Filled 2012-12-25: qty 1

## 2012-12-25 MED ORDER — ONDANSETRON 4 MG PO TBDP
4.0000 mg | ORAL_TABLET | Freq: Four times a day (QID) | ORAL | Status: DC | PRN
  Administered 2012-12-25 (×2): 4 mg via ORAL
  Filled 2012-12-25 (×2): qty 1

## 2012-12-25 MED ORDER — VITAMIN B-1 100 MG PO TABS: 100.0000 mg | ORAL_TABLET | Freq: Every day | ORAL | Status: DC

## 2012-12-25 MED ORDER — THIAMINE HCL 100 MG/ML IJ SOLN: 100.0000 mg | Freq: Once | INTRAMUSCULAR | Status: DC

## 2012-12-25 MED ORDER — CITALOPRAM HYDROBROMIDE 20 MG PO TABS
20.0000 mg | ORAL_TABLET | Freq: Every day | ORAL | Status: DC
  Administered 2012-12-25 – 2012-12-26 (×2): 20 mg via ORAL
  Filled 2012-12-25 (×2): qty 1

## 2012-12-25 MED ORDER — HYDROCHLOROTHIAZIDE 25 MG PO TABS
25.0000 mg | ORAL_TABLET | Freq: Every day | ORAL | Status: DC
  Administered 2012-12-25: 25 mg via ORAL
  Filled 2012-12-25 (×2): qty 1

## 2012-12-25 MED ORDER — CHLORDIAZEPOXIDE HCL 25 MG PO CAPS
25.0000 mg | ORAL_CAPSULE | Freq: Four times a day (QID) | ORAL | Status: AC
  Administered 2012-12-25 (×3): 25 mg via ORAL
  Filled 2012-12-25 (×3): qty 1

## 2012-12-25 MED ORDER — CHLORDIAZEPOXIDE HCL 25 MG PO CAPS: 25.0000 mg | ORAL_CAPSULE | ORAL | Status: DC

## 2012-12-25 MED ORDER — TRAZODONE HCL 50 MG PO TABS
50.0000 mg | ORAL_TABLET | Freq: Every evening | ORAL | Status: DC | PRN
  Administered 2012-12-25: 50 mg via ORAL
  Filled 2012-12-25: qty 1

## 2012-12-25 NOTE — ED Notes (Signed)
Kevin [name] BP 151/120

## 2012-12-25 NOTE — Consult Note (Signed)
Reason for Consult: Evaluation for inpatient treatment Referring Physician:  EDP  Kevin [name] is an 42 y.o. male.  HPI:  Patient presents to [facility] requesting detox form alcohol.  Patient states that he has a long history of alcohol abuse.  Patient also expresses that he is depressed and that is why he drinks.  Patient states that he needs long term help.  Patient states that he is not at this time doing any outpatient services but interested.  Past Medical History  Diagnosis Date  . Alcohol abuse   . Anxiety   . Depression   . Acid reflux     Past Surgical History  Procedure Laterality Date  . Alcohol abuse    . Left leg surgery    . Facial reconstruction surgery      d/t facial fx's    History reviewed. No pertinent family history.  Social History:  reports that he has been smoking Cigarettes.  He has been smoking about 0.50 packs per day. He does not have any smokeless tobacco history on file. He reports that  drinks alcohol. He reports that he uses illicit drugs (Marijuana and Cocaine).  Allergies: No Known Allergies  Medications: I have reviewed the patient's current medications.  Results for orders placed during the hospital encounter of 12/24/12 (from the past 48 hour(s))  URINE RAPID DRUG SCREEN (HOSP PERFORMED)     Status: Abnormal   Collection Time    12/24/12  9:53 PM      Result Value Range   Opiates NONE DETECTED  NONE DETECTED   Cocaine POSITIVE (*) NONE DETECTED   Benzodiazepines NONE DETECTED  NONE DETECTED   Amphetamines NONE DETECTED  NONE DETECTED   Tetrahydrocannabinol POSITIVE (*) NONE DETECTED   Barbiturates NONE DETECTED  NONE DETECTED   Comment:            DRUG SCREEN FOR MEDICAL PURPOSES     ONLY.  IF CONFIRMATION IS NEEDED     FOR ANY PURPOSE, NOTIFY LAB     WITHIN 5 DAYS.                LOWEST DETECTABLE LIMITS     FOR URINE DRUG SCREEN     Drug Class       Cutoff (ng/mL)     Amphetamine      1000     Barbiturate      200   Benzodiazepine   200     Tricyclics       300     Opiates          300     Cocaine          300     THC              50  ACETAMINOPHEN LEVEL     Status: None   Collection Time    12/24/12 10:04 PM      Result Value Range   Acetaminophen (Tylenol), Serum <15.0  10 - 30 ug/mL   Comment:            THERAPEUTIC CONCENTRATIONS VARY     SIGNIFICANTLY. A RANGE OF 10-30     ug/mL MAY BE AN EFFECTIVE     CONCENTRATION FOR MANY PATIENTS.     HOWEVER, SOME ARE BEST TREATED     AT CONCENTRATIONS OUTSIDE THIS     RANGE.     ACETAMINOPHEN CONCENTRATIONS     >150 ug/mL AT 4 HOURS AFTER  INGESTION AND >50 ug/mL AT 12     HOURS AFTER INGESTION ARE     OFTEN ASSOCIATED WITH TOXIC     REACTIONS.  CBC     Status: Abnormal   Collection Time    12/24/12 10:04 PM      Result Value Range   WBC 4.3  4.0 - 10.5 K/uL   RBC 3.96 (*) 4.22 - 5.81 MIL/uL   Hemoglobin 11.7 (*) 13.0 - 17.0 g/dL   HCT 14.7 (*) 82.9 - 56.2 %   MCV 86.1  78.0 - 100.0 fL   MCH 29.5  26.0 - 34.0 pg   MCHC 34.3  30.0 - 36.0 g/dL   RDW 13.0  86.5 - 78.4 %   Platelets 338  150 - 400 K/uL  COMPREHENSIVE METABOLIC PANEL     Status: Abnormal   Collection Time    12/24/12 10:04 PM      Result Value Range   Sodium 138  135 - 145 mEq/L   Potassium 3.6  3.5 - 5.1 mEq/L   Chloride 101  96 - 112 mEq/L   CO2 26  19 - 32 mEq/L   Glucose, Bld 109 (*) 70 - 99 mg/dL   BUN 12  6 - 23 mg/dL   Creatinine, Ser 6.96  0.50 - 1.35 mg/dL   Calcium 8.6  8.4 - 29.5 mg/dL   Total Protein 7.3  6.0 - 8.3 g/dL   Albumin 3.3 (*) 3.5 - 5.2 g/dL   AST 50 (*) 0 - 37 U/L   ALT 45  0 - 53 U/L   Alkaline Phosphatase 103  39 - 117 U/L   Total Bilirubin 0.2 (*) 0.3 - 1.2 mg/dL   GFR calc non Af Amer >90  >90 mL/min   GFR calc Af Amer >90  >90 mL/min   Comment:            The eGFR has been calculated     using the CKD EPI equation.     This calculation has not been     validated in all clinical     situations.     eGFR's persistently     <90  mL/min signify     possible Chronic Kidney Disease.  ETHANOL     Status: Abnormal   Collection Time    12/24/12 10:04 PM      Result Value Range   Alcohol, Ethyl (B) 266 (*) 0 - 11 mg/dL   Comment:            LOWEST DETECTABLE LIMIT FOR     SERUM ALCOHOL IS 11 mg/dL     FOR MEDICAL PURPOSES ONLY  SALICYLATE LEVEL     Status: Abnormal   Collection Time    12/24/12 10:04 PM      Result Value Range   Salicylate Lvl <2.0 (*) 2.8 - 20.0 mg/dL    No results found.  Review of Systems  Gastrointestinal: Negative for nausea and vomiting.  Neurological: Positive for tremors.  Psychiatric/Behavioral: Positive for depression and substance abuse. Negative for suicidal ideas, hallucinations and memory loss. The patient has insomnia. The patient is not nervous/anxious.        Patient states that he is having some depression since he stopped taking Celexa several years ago.   Blood pressure 156/90, pulse 50, temperature 98.7 F (37.1 C), temperature source Oral, resp. rate 17, SpO2 98.00%. Physical Exam  Constitutional: He is oriented to person, place, and time. He appears well-developed.  HENT:  Head: Normocephalic.  Neck: Normal range of motion.  Respiratory: Effort normal.  Musculoskeletal: Normal range of motion.  Neurological: He is alert and oriented to person, place, and time.  Skin: Skin is warm and dry.  Psychiatric: His speech is normal and behavior is normal. He is not actively hallucinating. Thought content is not paranoid. Cognition and memory are normal. He expresses impulsivity. He exhibits a depressed mood. He expresses no homicidal and no suicidal ideation.    Assessment/Plan: Axis I: Alcohol Abuse and Alcohol Withdrawal Recommendation:   1. Monitor patient over night in ED for withdrawal symptoms.   2. Start Librium protocol, Celexa 20 mg QD, Trazodone 50 mg QHS PRN insomnia    3. May discharge after alcohol detox completed and set up outpatient services.  Long term  therapy      resources given  Assunta Found, FNP-BC 12/25/2012, 2:31 PM     I have personally seen the patient and agreed with the findings and involved in the treatment plan. Kathryne Sharper, MD

## 2012-12-25 NOTE — ED Notes (Signed)
Notified Dr.Yow of elevated BP-orders received and initiated.

## 2012-12-25 NOTE — BHH Counselor (Signed)
Pt now stating he doesn't want detox and is ready to be discharged. He says he wants to "look in to detox on my own but first I got to go home and take care of some things". Pt denies SI and HI. He denies [facility] and no delusions noted. Writer provided pt with outpatient referrals for SA and detox.   Evette Cristal, [place] Assessment Counselor

## 2012-12-25 NOTE — BH Assessment (Signed)
BHH Assessment Progress Note   Per Derwood Kaplan NP who did rounds with Dr. Lolly Mustache pt is going to stay in psych ED and detox there and then be provided the appropriate referrals to [facility] or [facility] and participate in either rehab or optx.    See Shavon Rankin note

## 2012-12-26 NOTE — ED Provider Notes (Signed)
Medical screening examination/treatment/procedure(s) were performed by non-physician practitioner and as supervising physician I was immediately available for consultation/collaboration.   Ludwika Rodd L Asmar Brozek, MD 12/26/12 1539 

## 2012-12-26 NOTE — ED Provider Notes (Signed)
8:29 AM  Filed Vitals:   12/26/12 0500  BP: 120/77  Pulse: 63  Temp:   Resp: 16   Pt assessed.  No acute complaints. Here voluntarily for substance abuse issues. No psychosis. Given resources.  Raeford Razor, MD 12/26/12 [phone]

## 2012-12-26 NOTE — ED Notes (Signed)
Pt reports he's around the wrong people so that's why he does drugs and drinks and he reports having no where else to go. He said he lives at [facility] and it's closing July 21st but he considers himself homeless now. He reports drinking 3 40 oz beers/day and some wine sometimes. He said he only does crack when someone gives it to him he doesn't buy it himself. And he admits to [facility] abuse. He said he used to go to AA since he's detoxed before but he got bored listening to everyone's stories and never got a sponsor assigned to him but he agrees he needs one and needs to go back to AA. He says he feels like drinking right now but he can't in hospital. He reports getting depressed and drinking ETOH more. When he drinks too much he has blackouts and can't remember a lot of things. He said he'll probably go to [facility] again after discharge.

## 2013-02-17 ENCOUNTER — Encounter (HOSPITAL_COMMUNITY): Payer: Self-pay

## 2013-02-17 ENCOUNTER — Emergency Department (HOSPITAL_COMMUNITY)
Admission: EM | Admit: 2013-02-17 | Discharge: 2013-02-19 | Disposition: A | Discharge status: Other Acute Inpatient Hospital | Payer: Self-pay | Attending: Emergency Medicine | Admitting: Emergency Medicine

## 2013-02-17 DIAGNOSIS — F1010 Alcohol abuse, uncomplicated: Secondary | ICD-10-CM | POA: Insufficient documentation

## 2013-02-17 DIAGNOSIS — Z8719 Personal history of other diseases of the digestive system: Secondary | ICD-10-CM | POA: Insufficient documentation

## 2013-02-17 DIAGNOSIS — F17200 Nicotine dependence, unspecified, uncomplicated: Secondary | ICD-10-CM | POA: Insufficient documentation

## 2013-02-17 DIAGNOSIS — Z8659 Personal history of other mental and behavioral disorders: Secondary | ICD-10-CM | POA: Insufficient documentation

## 2013-02-17 LAB — CBC WITH DIFFERENTIAL/PLATELET
Basophils Absolute: 0 10*3/uL (ref 0.0–0.1)
Lymphocytes Relative: 36 % (ref 12–46)
Lymphs Abs: 1.8 10*3/uL (ref 0.7–4.0)
MCV: 80.8 fL (ref 78.0–100.0)
Neutro Abs: 2.8 10*3/uL (ref 1.7–7.7)
Neutrophils Relative %: 56 % (ref 43–77)
Platelets: 379 10*3/uL (ref 150–400)
RBC: 5.06 MIL/uL (ref 4.22–5.81)
RDW: 13 % (ref 11.5–15.5)
WBC: 4.9 10*3/uL (ref 4.0–10.5)

## 2013-02-17 LAB — COMPREHENSIVE METABOLIC PANEL
ALT: 37 U/L (ref 0–53)
AST: 60 U/L — ABNORMAL HIGH (ref 0–37)
Alkaline Phosphatase: 129 U/L — ABNORMAL HIGH (ref 39–117)
CO2: 30 mEq/L (ref 19–32)
Calcium: 8.9 mg/dL (ref 8.4–10.5)
Chloride: 100 mEq/L (ref 96–112)
GFR calc Af Amer: >90 mL/min (ref >90)
GFR calc non Af Amer: >90 mL/min (ref >90)
Glucose, Bld: 94 mg/dL (ref 70–99)
Potassium: 3.9 mEq/L (ref 3.5–5.1)
Sodium: 138 mEq/L (ref 135–145)

## 2013-02-17 LAB — RAPID URINE DRUG SCREEN, HOSP PERFORMED
Barbiturates: NOT DETECTED
Cocaine: NOT DETECTED
Tetrahydrocannabinol: NOT DETECTED

## 2013-02-17 LAB — SALICYLATE LEVEL: Salicylate Lvl: <2 mg/dL — ABNORMAL LOW (ref 2.8–20.0)

## 2013-02-17 MED ORDER — VITAMIN B-1 100 MG PO TABS
100.0000 mg | ORAL_TABLET | Freq: Every day | ORAL | Status: DC
  Administered 2013-02-17 – 2013-02-18 (×2): 100 mg via ORAL
  Filled 2013-02-17 (×2): qty 1

## 2013-02-17 MED ORDER — NICOTINE 21 MG/24HR TD PT24
21.0000 mg | MEDICATED_PATCH | Freq: Every day | TRANSDERMAL | Status: DC
  Administered 2013-02-18 (×2): 21 mg via TRANSDERMAL
  Filled 2013-02-17 (×2): qty 1

## 2013-02-17 MED ORDER — ACETAMINOPHEN 325 MG PO TABS
650.0000 mg | ORAL_TABLET | ORAL | Status: DC | PRN
  Administered 2013-02-18 (×2): 650 mg via ORAL
  Filled 2013-02-17 (×2): qty 2

## 2013-02-17 MED ORDER — ALUM & MAG HYDROXIDE-SIMETH 200-200-20 MG/5ML PO SUSP
30.0000 mL | ORAL | Status: DC | PRN
  Administered 2013-02-18: 30 mL via ORAL
  Filled 2013-02-17: qty 30

## 2013-02-17 MED ORDER — LORAZEPAM 1 MG PO TABS: 0.0000 mg | ORAL_TABLET | Freq: Two times a day (BID) | ORAL | Status: DC

## 2013-02-17 MED ORDER — THIAMINE HCL 100 MG/ML IJ SOLN: 100.0000 mg | Freq: Every day | INTRAMUSCULAR | Status: DC

## 2013-02-17 MED ORDER — LORAZEPAM 1 MG PO TABS
0.0000 mg | ORAL_TABLET | Freq: Four times a day (QID) | ORAL | Status: DC
  Administered 2013-02-17 – 2013-02-18 (×2): 1 mg via ORAL
  Administered 2013-02-18: 2 mg via ORAL
  Filled 2013-02-17: qty 2
  Filled 2013-02-17 (×2): qty 1

## 2013-02-17 MED ORDER — ZOLPIDEM TARTRATE 5 MG PO TABS: 5.0000 mg | ORAL_TABLET | Freq: Every evening | ORAL | Status: DC | PRN

## 2013-02-17 MED ORDER — IBUPROFEN 200 MG PO TABS: 600.0000 mg | ORAL_TABLET | Freq: Three times a day (TID) | ORAL | Status: DC | PRN

## 2013-02-17 MED ORDER — ONDANSETRON HCL 4 MG PO TABS
4.0000 mg | ORAL_TABLET | Freq: Three times a day (TID) | ORAL | Status: DC | PRN
  Administered 2013-02-18: 4 mg via ORAL
  Filled 2013-02-17: qty 1

## 2013-02-17 MED ORDER — LORAZEPAM 1 MG PO TABS
1.0000 mg | ORAL_TABLET | Freq: Three times a day (TID) | ORAL | Status: DC | PRN
  Administered 2013-02-18: 1 mg via ORAL
  Filled 2013-02-17: qty 1

## 2013-02-17 NOTE — ED Notes (Addendum)
Pt is highly intoxicated.  States he has been drinking and did crack. Initially on arrival he said he wanted help.  Then he said he didn't want to be here.  He changed his mind again and said he wants to be here. He is unable to provide a clear history but states he recently got out of jail and wants help.  He is easily agitated but easily calmed as well.

## 2013-02-17 NOTE — ED Notes (Addendum)
Pt is heavily intoxicated and requesting detox.  Pt states he was recently released from prison.  VS by EMS: 148/p, 80,  18, CBG 108.

## 2013-02-17 NOTE — ED Notes (Signed)
Pt seen in the hallway ambulating with no difficulty.

## 2013-02-17 NOTE — ED Notes (Signed)
PA at bedside.
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
## 2013-02-18 DIAGNOSIS — F1910 Other psychoactive substance abuse, uncomplicated: Secondary | ICD-10-CM

## 2013-02-18 DIAGNOSIS — F329 Major depressive disorder, single episode, unspecified: Secondary | ICD-10-CM

## 2013-02-18 NOTE — ED Provider Notes (Signed)
Medical screening examination/treatment/procedure(s) were performed by non-physician practitioner and as supervising physician I was immediately available for consultation/collaboration.  Kiara Keep, MD 02/18/13 1545 

## 2013-02-18 NOTE — ED Provider Notes (Signed)
Medical screening examination/treatment/procedure(s) were performed by non-physician practitioner and as supervising physician I was immediately available for consultation/collaboration.  Derwood Kaplan, MD 02/18/13 1545

## 2013-02-18 NOTE — ED Notes (Signed)
Attempted to call report to RN at [facility], no staff available at this time to take report.

## 2013-02-18 NOTE — ED Notes (Signed)
Attempted to call report to [facility], RN not available at this time. Will try again.

## 2013-02-18 NOTE — ED Notes (Signed)
Pt given a sandwich and a sprite 

## 2013-02-18 NOTE — Consult Note (Signed)
[facility] Face-to-Face Psychiatry Consult   Reason for Consult:  Etoh Dependence Referring Physician:  Emergency room physician Kevin [name] is an 42 y.o. male.  Assessment: AXIS I:  Depressive Disorder NOS and Substance Abuse AXIS II:  Deferred AXIS III:   Past Medical History  Diagnosis Date  . Alcohol abuse   . Anxiety   . Depression   . Acid reflux    AXIS IV:  other psychosocial or environmental problems and problems related to social environment AXIS V:  51-60 moderate symptoms  Plan:  Recommend psychiatric Inpatient admission when medically cleared.  Subjective:   Kevin [name] is a 42 y.o. male patient admitted with requesting detox from his alcohol and drugs.  Patient has a long history of using drugs and alcohol.  He was recently released from the jail after 45 days dispensed A.  Patient relapsed into drinking and admitted 3-4 40 ounce bottle every day.  His alcohol level is more than 400.  The patient also experiencing tremors and shakes.  He is noncompliance with his psychiatric medication is Celexa and trazodone.  Patient reported poor sleep, racing thoughts and feeling hopeless and helpless.  He admitted having suicidal thoughts by drinking but denies any plan at this time.  Patient denies any hallucinations or any paranoia.  He admitted smoking pot and cocaine.  Currently he is homeless.[name]  HPI:  See above HPI Elements:   Location:  In the emergency room. Quality:  poor. Severity:  Unable to function. Timing:  Since Wednesday. Duration:  Ongoing.  Past Psychiatric History: Past Medical History  Diagnosis Date  . Alcohol abuse   . Anxiety   . Depression   . Acid reflux     reports that he has been smoking Cigarettes.  He has been smoking about 0.50 packs per day. He does not have any smokeless tobacco history on file. He reports that  drinks alcohol. He reports that he uses illicit drugs (Marijuana and Cocaine). History reviewed. No pertinent family  history.         Allergies:  No Known Allergies  ACT Assessment Complete:  No:   Past Psychiatric History: Diagnosis:  Patient has history of using drugs and alcohol.  He was seeing psychiatrist at [facility] in [place] .  He was taking Celexa and trazodone.  Patient has history of alcohol and substance use treatment at [facility].  Hospitalizations:  None   Outpatient Care:  [facility]  Substance Abuse Care:  Yes .  History of drinking alcohol, using marijuana Cocaine   Self-Mutilation:  Denies   Suicidal Attempts:  Denies   Homicidal Behaviors:  Denies    Violent Behaviors:  Denies    Place of Residence:  Homeless Marital Status:  Single Employed/Unemployed:  Unemployed Education:  No known Family Supports:  Limited Objective: Blood pressure 123/85, pulse 83, temperature 98.5 F (36.9 C), temperature source Oral, resp. rate 17, SpO2 97.00%.There is no height or weight on file to calculate BMI. Results for orders placed during the hospital encounter of 02/17/13 (from the past 72 hour(s))  URINE RAPID DRUG SCREEN (HOSP PERFORMED)     Status: None   Collection Time    02/17/13  6:31 PM      Result Value Range   Opiates NONE DETECTED  NONE DETECTED   Cocaine NONE DETECTED  NONE DETECTED   Benzodiazepines NONE DETECTED  NONE DETECTED   Amphetamines NONE DETECTED  NONE DETECTED   Tetrahydrocannabinol NONE DETECTED  NONE DETECTED  Barbiturates NONE DETECTED  NONE DETECTED   Comment:            DRUG SCREEN FOR MEDICAL PURPOSES     ONLY.  IF CONFIRMATION IS NEEDED     FOR ANY PURPOSE, NOTIFY LAB     WITHIN 5 DAYS.                LOWEST DETECTABLE LIMITS     FOR URINE DRUG SCREEN     Drug Class       Cutoff (ng/mL)     Amphetamine      1000     Barbiturate      200     Benzodiazepine   200     Tricyclics       300     Opiates          300     Cocaine          300     THC              50  CBC WITH DIFFERENTIAL     Status: None   Collection Time    02/17/13  6:33 PM       Result Value Range   WBC 4.9  4.0 - 10.5 K/uL   RBC 5.06  4.22 - 5.81 MIL/uL   Hemoglobin 14.0  13.0 - 17.0 g/dL   HCT 16.1  09.6 - 04.5 %   MCV 80.8  78.0 - 100.0 fL   MCH 27.7  26.0 - 34.0 pg   MCHC 34.2  30.0 - 36.0 g/dL   RDW 40.9  81.1 - 91.4 %   Platelets 379  150 - 400 K/uL   Neutrophils Relative % 56  43 - 77 %   Neutro Abs 2.8  1.7 - 7.7 K/uL   Lymphocytes Relative 36  12 - 46 %   Lymphs Abs 1.8  0.7 - 4.0 K/uL   Monocytes Relative 7  3 - 12 %   Monocytes Absolute 0.3  0.1 - 1.0 K/uL   Eosinophils Relative 1  0 - 5 %   Eosinophils Absolute 0.0  0.0 - 0.7 K/uL   Basophils Relative 0  0 - 1 %   Basophils Absolute 0.0  0.0 - 0.1 K/uL  COMPREHENSIVE METABOLIC PANEL     Status: Abnormal   Collection Time    02/17/13  6:33 PM      Result Value Range   Sodium 138  135 - 145 mEq/L   Potassium 3.9  3.5 - 5.1 mEq/L   Chloride 100  96 - 112 mEq/L   CO2 30  19 - 32 mEq/L   Glucose, Bld 94  70 - 99 mg/dL   BUN 11  6 - 23 mg/dL   Creatinine, Ser 7.82  0.50 - 1.35 mg/dL   Calcium 8.9  8.4 - 95.6 mg/dL   Total Protein 8.6 (*) 6.0 - 8.3 g/dL   Albumin 4.1  3.5 - 5.2 g/dL   AST 60 (*) 0 - 37 U/L   ALT 37  0 - 53 U/L   Alkaline Phosphatase 129 (*) 39 - 117 U/L   Total Bilirubin 0.2 (*) 0.3 - 1.2 mg/dL   GFR calc non Af Amer >90  >90 mL/min   GFR calc Af Amer >90  >90 mL/min   Comment: (NOTE)     The eGFR has been calculated using the CKD EPI equation.     This calculation has  not been validated in all clinical situations.     eGFR's persistently <90 mL/min signify possible Chronic Kidney     Disease.  ETHANOL     Status: Abnormal   Collection Time    02/17/13  6:33 PM      Result Value Range   Alcohol, Ethyl (B) 403 (*) 0 - 11 mg/dL   Comment:            LOWEST DETECTABLE LIMIT FOR     SERUM ALCOHOL IS 11 mg/dL     FOR MEDICAL PURPOSES ONLY     CRITICAL RESULT CALLED TO, READ BACK BY AND VERIFIED WITH:     BEANE RN AT 1908 ON [id number] BY DLONG  SALICYLATE LEVEL     Status:  Abnormal   Collection Time    02/17/13  6:33 PM      Result Value Range   Salicylate Lvl <2.0 (*) 2.8 - 20.0 mg/dL  ACETAMINOPHEN LEVEL     Status: None   Collection Time    02/17/13  6:33 PM      Result Value Range   Acetaminophen (Tylenol), Serum <15.0  10 - 30 ug/mL   Comment:            THERAPEUTIC CONCENTRATIONS VARY     SIGNIFICANTLY. A RANGE OF 10-30     ug/mL MAY BE AN EFFECTIVE     CONCENTRATION FOR MANY PATIENTS.     HOWEVER, SOME ARE BEST TREATED     AT CONCENTRATIONS OUTSIDE THIS     RANGE.     ACETAMINOPHEN CONCENTRATIONS     >150 ug/mL AT 4 HOURS AFTER     INGESTION AND >50 ug/mL AT 12     HOURS AFTER INGESTION ARE     OFTEN ASSOCIATED WITH TOXIC     REACTIONS.  ETHANOL     Status: Abnormal   Collection Time    02/18/13  2:25 AM      Result Value Range   Alcohol, Ethyl (B) 288 (*) 0 - 11 mg/dL   Comment:            LOWEST DETECTABLE LIMIT FOR     SERUM ALCOHOL IS 11 mg/dL     FOR MEDICAL PURPOSES ONLY   Labs are reviewed and are pertinent for high blood alcohol level.  Current Facility-Administered Medications  Medication Dose Route Frequency Provider Last Rate Last Dose  . acetaminophen (TYLENOL) tablet 650 mg  650 mg Oral Q4H PRN Garlon Hatchet, PA-C   650 mg at 02/18/13 0104  . alum & mag hydroxide-simeth (MAALOX/MYLANTA) 200-200-20 MG/5ML suspension 30 mL  30 mL Oral PRN Garlon Hatchet, PA-C      . ibuprofen (ADVIL,MOTRIN) tablet 600 mg  600 mg Oral Q8H PRN Garlon Hatchet, PA-C      . LORazepam (ATIVAN) tablet 0-4 mg  0-4 mg Oral Q6H Garlon Hatchet, PA-C   1 mg at 02/18/13 0454   Followed by  . [START ON 02/19/2013] LORazepam (ATIVAN) tablet 0-4 mg  0-4 mg Oral Q12H Garlon Hatchet, PA-C      . LORazepam (ATIVAN) tablet 1 mg  1 mg Oral Q8H PRN Garlon Hatchet, PA-C      . nicotine (NICODERM CQ - dosed in mg/24 hours) patch 21 mg  21 mg Transdermal Daily Garlon Hatchet, PA-C   21 mg at 02/18/13 1031  . ondansetron (ZOFRAN) tablet 4 mg  4 mg Oral Q8H PRN Garlon Hatchet,  PA-C   4 mg at 02/18/13 0706  . thiamine (VITAMIN B-1) tablet 100 mg  100 mg Oral Daily Garlon Hatchet, PA-C   100 mg at 02/18/13 1031   Or  . thiamine (B-1) injection 100 mg  100 mg Intravenous Daily Garlon Hatchet, PA-C      . zolpidem [facility]) tablet 5 mg  5 mg Oral QHS PRN Garlon Hatchet, PA-C       Current Outpatient Prescriptions  Medication Sig Dispense Refill  . [DISCONTINUED] citalopram (CELEXA) 20 MG tablet Take 20 mg by mouth daily.        . [DISCONTINUED] omeprazole (PRILOSEC) 20 MG capsule Take 20 mg by mouth daily.       . [DISCONTINUED] traZODone (DESYREL) 150 MG tablet Take 150 mg by mouth at bedtime.          Psychiatric Specialty Exam:     Blood pressure 123/85, pulse 83, temperature 98.5 F (36.9 C), temperature source Oral, resp. rate 17, SpO2 97.00%.There is no height or weight on file to calculate BMI.  General Appearance: Disheveled  Eye Solicitor::  Fair  Speech:  Normal Rate  Volume:  Decreased  Mood:  Anxious  Affect:  Constricted  Thought Process:  Goal Directed  Orientation:  Full (Time, Place, and Person)  Thought Content:  Negative  Suicidal Thoughts:  Yes.  without intent/plan  Homicidal Thoughts:  No  Memory:  Negative  Judgement:  Impaired  Insight:  Shallow  Psychomotor Activity:  Decreased  Concentration:  Fair  Recall:  Fair  Akathisia:  No  Handed:  Right  AIMS (if indicated):     Assets:  Desire for Improvement  Sleep:      Treatment Plan Summary: Patient requires inpatient detox treatment at this time.  He has high liver enzymes and his alcohol level is still very high.  We will start Ativan for detox and restart his Celexa and trazodone.  Recommended inpatient psychiatric treatment once medically cleared.  Mykalah Saari T. 02/18/2013 11:25 AM

## 2013-02-18 NOTE — ED Notes (Addendum)
Pt's belongings placed in locker 29.

## 2013-02-18 NOTE — ED Notes (Signed)
Attempted to call report to [facility], informed staff unable to take report currently. Will again call unit.

## 2013-02-18 NOTE — ED Provider Notes (Signed)
6:15 AM Patient signed out by Kyung Bacca, PA-C at shift change.  Patient presenting with alcohol intoxication requesting detox. Plan is to wait until the patient sobers up and then reassess and determine if the patient continues to request detox once clinically sober.  CIWA orders have been placed.  7:30 AM Reassessed patient.  Patient alert and orientated x 3, no slurred speech, no tremors.  Patient continues to state that he would like alcohol detox and is requesting inpatient rehabilitation treatment.  TTS consulted.  Pascal Lux [place], PA-C 02/18/13 [phone]

## 2013-02-18 NOTE — ED Notes (Signed)
Pt reports nausea 

## 2013-02-18 NOTE — ED Provider Notes (Signed)
Pt received from [place], [place].  Pt presented to ED w/ alcohol intoxication, request for detox and SI.  Initial Etoh level 403.  Plan was to allow him time to sober and then readdress his request for detox.  Per nursing staff, he is ambulatory, tolerating pos, and slurred speech has improved.  Pt continues to request detox.  He wants to "make changes in his life".   He denies SI.  Will recheck alcohol level. 1:54 AM   Alcohol level 288.  Will continue to monitor.  2:58 AM   [place], PA-C to resume care. 6:18 AM   Otilio Miu, PA-C 02/18/13 [phone]

## 2013-02-18 NOTE — ED Notes (Signed)
Belongings moved to locker 40.

## 2013-02-18 NOTE — ED Notes (Signed)
Pt currently asleep; no s/s of distress noted. Respirations regular and unlabored. 

## 2013-02-18 NOTE — ED Provider Notes (Signed)
Medical screening examination/treatment/procedure(s) were performed by non-physician practitioner and as supervising physician I was immediately available for consultation/collaboration.  Mallery Harshman R. Marisa Hage, MD 02/18/13 1022 

## 2013-02-19 ENCOUNTER — Encounter (HOSPITAL_COMMUNITY): Payer: Self-pay | Admitting: *Deleted

## 2013-02-19 ENCOUNTER — Inpatient Hospital Stay (HOSPITAL_COMMUNITY)
Admission: AD | Admit: 2013-02-19 | Discharge: 2013-02-21 | DRG: 897 | Disposition: A | Discharge status: Home / Self Care | Payer: Self-pay | Source: Intra-hospital | Attending: Psychiatry | Admitting: Psychiatry

## 2013-02-19 DIAGNOSIS — F1020 Alcohol dependence, uncomplicated: Secondary | ICD-10-CM

## 2013-02-19 DIAGNOSIS — K219 Gastro-esophageal reflux disease without esophagitis: Secondary | ICD-10-CM | POA: Diagnosis present

## 2013-02-19 DIAGNOSIS — I10 Essential (primary) hypertension: Secondary | ICD-10-CM | POA: Diagnosis present

## 2013-02-19 DIAGNOSIS — F10939 Alcohol use, unspecified with withdrawal, unspecified: Principal | ICD-10-CM | POA: Diagnosis present

## 2013-02-19 DIAGNOSIS — F17200 Nicotine dependence, unspecified, uncomplicated: Secondary | ICD-10-CM | POA: Diagnosis present

## 2013-02-19 DIAGNOSIS — F411 Generalized anxiety disorder: Secondary | ICD-10-CM | POA: Diagnosis present

## 2013-02-19 DIAGNOSIS — F339 Major depressive disorder, recurrent, unspecified: Secondary | ICD-10-CM

## 2013-02-19 DIAGNOSIS — F10239 Alcohol dependence with withdrawal, unspecified: Principal | ICD-10-CM | POA: Diagnosis present

## 2013-02-19 HISTORY — DX: Essential (primary) hypertension: I10

## 2013-02-19 MED ORDER — THIAMINE HCL 100 MG/ML IJ SOLN: 100.0000 mg | Freq: Once | INTRAMUSCULAR | Status: DC

## 2013-02-19 MED ORDER — ADULT MULTIVITAMIN W/MINERALS CH
1.0000 | ORAL_TABLET | Freq: Every day | ORAL | Status: DC
  Administered 2013-02-19 – 2013-02-21 (×3): 1 via ORAL
  Filled 2013-02-19 (×5): qty 1

## 2013-02-19 MED ORDER — CHLORDIAZEPOXIDE HCL 25 MG PO CAPS
25.0000 mg | ORAL_CAPSULE | Freq: Four times a day (QID) | ORAL | Status: AC
  Administered 2013-02-19 (×4): 25 mg via ORAL
  Filled 2013-02-19 (×4): qty 1

## 2013-02-19 MED ORDER — PNEUMOCOCCAL VAC POLYVALENT 25 MCG/0.5ML IJ INJ
0.5000 mL | INJECTION | INTRAMUSCULAR | Status: AC
  Administered 2013-02-20: 0.5 mL via INTRAMUSCULAR

## 2013-02-19 MED ORDER — CHLORDIAZEPOXIDE HCL 25 MG PO CAPS: 25.0000 mg | ORAL_CAPSULE | ORAL | Status: DC

## 2013-02-19 MED ORDER — CHLORDIAZEPOXIDE HCL 25 MG PO CAPS
25.0000 mg | ORAL_CAPSULE | Freq: Three times a day (TID) | ORAL | Status: AC
  Administered 2013-02-20 (×3): 25 mg via ORAL
  Filled 2013-02-19 (×3): qty 1

## 2013-02-19 MED ORDER — BENAZEPRIL HCL 20 MG PO TABS
20.0000 mg | ORAL_TABLET | Freq: Once | ORAL | Status: AC
  Administered 2013-02-19: 20 mg via ORAL
  Filled 2013-02-19: qty 2
  Filled 2013-02-19: qty 1

## 2013-02-19 MED ORDER — HYDROXYZINE HCL 25 MG PO TABS: 25.0000 mg | ORAL_TABLET | Freq: Four times a day (QID) | ORAL | Status: DC | PRN

## 2013-02-19 MED ORDER — VITAMIN B-1 100 MG PO TABS
100.0000 mg | ORAL_TABLET | Freq: Every day | ORAL | Status: DC
  Administered 2013-02-20 – 2013-02-21 (×2): 100 mg via ORAL
  Filled 2013-02-19 (×4): qty 1

## 2013-02-19 MED ORDER — TRAZODONE HCL 50 MG PO TABS
50.0000 mg | ORAL_TABLET | Freq: Every evening | ORAL | Status: DC | PRN
  Administered 2013-02-19 – 2013-02-20 (×2): 50 mg via ORAL
  Filled 2013-02-19 (×2): qty 1

## 2013-02-19 MED ORDER — ONDANSETRON 4 MG PO TBDP: 4.0000 mg | ORAL_TABLET | Freq: Four times a day (QID) | ORAL | Status: DC | PRN

## 2013-02-19 MED ORDER — CHLORDIAZEPOXIDE HCL 25 MG PO CAPS
25.0000 mg | ORAL_CAPSULE | Freq: Once | ORAL | Status: AC
  Administered 2013-02-19: 25 mg via ORAL
  Filled 2013-02-19: qty 1

## 2013-02-19 MED ORDER — ALUM & MAG HYDROXIDE-SIMETH 200-200-20 MG/5ML PO SUSP: 30.0000 mL | ORAL | Status: DC | PRN

## 2013-02-19 MED ORDER — CHLORDIAZEPOXIDE HCL 25 MG PO CAPS: 25.0000 mg | ORAL_CAPSULE | Freq: Four times a day (QID) | ORAL | Status: DC | PRN

## 2013-02-19 MED ORDER — CHLORDIAZEPOXIDE HCL 25 MG PO CAPS
25.0000 mg | ORAL_CAPSULE | Freq: Every day | ORAL | Status: DC
Start: 2013-02-22 — End: 2013-02-21

## 2013-02-19 MED ORDER — MAGNESIUM HYDROXIDE 400 MG/5ML PO SUSP: 30.0000 mL | Freq: Every day | ORAL | Status: DC | PRN

## 2013-02-19 MED ORDER — NICOTINE 14 MG/24HR TD PT24
14.0000 mg | MEDICATED_PATCH | Freq: Every day | TRANSDERMAL | Status: DC
  Administered 2013-02-19 – 2013-02-21 (×3): 14 mg via TRANSDERMAL
  Filled 2013-02-19 (×7): qty 1

## 2013-02-19 MED ORDER — LOPERAMIDE HCL 2 MG PO CAPS: 2.0000 mg | ORAL_CAPSULE | ORAL | Status: DC | PRN

## 2013-02-19 MED ORDER — ACETAMINOPHEN 325 MG PO TABS
650.0000 mg | ORAL_TABLET | Freq: Four times a day (QID) | ORAL | Status: DC | PRN
  Administered 2013-02-20: 650 mg via ORAL

## 2013-02-19 MED ORDER — BENAZEPRIL HCL 10 MG PO TABS
10.0000 mg | ORAL_TABLET | Freq: Every day | ORAL | Status: DC
  Administered 2013-02-20 – 2013-02-21 (×2): 10 mg via ORAL
  Filled 2013-02-19 (×4): qty 1
  Filled 2013-02-19: qty 14

## 2013-02-19 NOTE — [facility] Group Notes (Signed)
[facility] Group Notes:  (Clinical Social Work)  02/19/2013  10:00-11:00AM  Summary of Progress/Problems:   The main focus of today's process group was to   identify the patient's current support system and decide on other supports that can be put in place.  The picture on workbook was used to discuss why additional supports are needed, and a hand-out was distributed with four definitions/levels of support, then used to talk about how patients have given and received all different kinds of support.  An emphasis was placed on using counselor, doctor, therapy groups, 12-step groups, and problem-specific support groups to expand supports.  The patient identified his family as being his main support "when I am trying to stay clean".  His mother has been enabling him through giving him money when he asked, so she has recently stopped that.  Type of Therapy:  Process Group with Motivational Interviewing  Participation Level:  Active  Participation Quality:  Attentive  Affect:  Blunted  Cognitive:  Appropriate and Oriented  Insight:  Developing/Improving  Engagement in Therapy:  Engaged  Modes of Intervention:   Education, Support and Processing, Activity  [employer], LCSW 02/19/2013, 1:05 PM

## 2013-02-19 NOTE — Progress Notes (Signed)
D   Pt is pleasant and cooperative upon approach   He denies any signs and symptoms of withdrawal   He said it is very minimal   Pt denies suicidal ideation  He reports normal energy level fair sleep good appetite and ability to pay attention is improving  He endorses depression and mild hopelessness   His goal today is to stay focused  A   Verbal support given  Medications administered and effectiveness monitored   Q 15 min checks   Continue to monitor for signs and symptoms of withdrawal R   Pt safe at present
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
## 2013-02-19 NOTE — H&P (Signed)
Psychiatric Admission Assessment Adult  Patient Identification:  Kevin [name]  Date of Evaluation:  02/19/2013  Chief Complaint:  PENDING  History of Present Illness: This is a 42 year old African-America male. Admitted to [facility] from the [facility] with complaints of alcohol intoxication requesting detox. Kevin [name] reports, "I was taken to the hospital late last night. I had too much to drink. I was highly intoxicated, shaking and everything. I remembered drinking some mad dog, orange screw drivers and [id number]. I had just gotten out of jail after spending 45 days for being charged with open container. I'm an alcoholic. I have been drinking since I was 13. I have been to [facility] for 28 days and [facility] for 90 days. I am still drinking. I wished that I can have a job. Something to keep me busy so that I would not be sitting around drinking alcohol. When I got of jail 3 days ago, I was on my way to [employer] to buy a phone to call my mother who lives in [place]. I did not continue with my plan to go to [facility] rather, I decided to go and see some old friends. Here I am now".  Elements:  Location:  [facility] adult. Quality:  Hot flashes, chills, anxiety. Severity:  Severe. Timing:  "I drank 3 days ago". Duration:  "I'M an alcoholic period". Context:  "I had just goten out of jail 3 days ealier after 45 days, went to see some old buddies, they bought beer/liquor".  Associated Signs/Synptoms: Depression Symptoms:  insomnia, feelings of worthlessness/guilt, hopelessness, anxiety, loss of energy/fatigue,  (Hypo) Manic Symptoms:  Impulsivity,  Anxiety Symptoms:  Excessive Worry,  Psychotic Symptoms:  Hallucinations: None  PTSD Symptoms: Had a traumatic exposure:  Denies  Psychiatric Specialty Exam: Physical Exam  Constitutional: He is oriented to person, place, and time. He appears well-developed.  HENT:  Head: Normocephalic.  Eyes: Pupils are equal, round, and reactive to  light.  Neck: Normal range of motion.  Cardiovascular: Normal rate.   Respiratory: Effort normal.  GI: Soft.  Musculoskeletal: Normal range of motion.  Neurological: He is alert and oriented to person, place, and time.  Skin: Skin is warm and dry.  Psychiatric: His speech is normal and behavior is normal. Thought content normal. His mood appears anxious. He expresses impulsivity.    Review of Systems  Constitutional: Positive for chills, malaise/fatigue and diaphoresis.  HENT: Negative.   Eyes: Negative.   Respiratory: Negative.   Cardiovascular: Negative.   Gastrointestinal: Negative.   Genitourinary: Negative.   Musculoskeletal: Positive for myalgias.  Skin: Negative.        Hair in dread locs  Neurological: Positive for tremors and weakness.  Endo/Heme/Allergies: Negative.   Psychiatric/Behavioral: Positive for depression and substance abuse (Alcoholism). Negative for suicidal ideas, hallucinations and memory loss. The patient is nervous/anxious and has insomnia.     Blood pressure 149/100, pulse 86, temperature 99 F (37.2 C), temperature source Oral, resp. rate 20, height 5\' 6"  (1.676 m), weight 66.225 kg (146 lb).Body mass index is 23.58 kg/(m^2).  General Appearance: Disheveled  Eye Contact::  Good  Speech:  Clear and Coherent  Volume:  Normal  Mood:  Anxious and Hopeless  Affect:  Flat  Thought Process:  Coherent  Orientation:  Full (Time, Place, and Person)  Thought Content:  Rumination  Suicidal Thoughts:  No  Homicidal Thoughts:  No  Memory:  Immediate;   Good Recent;   Good Remote;   Good  Judgement:  Fair  Insight:  Fair  Psychomotor Activity:  Restlessness  Concentration:  Fair  Recall:  Good  Akathisia:  No  Handed:  Right  AIMS (if indicated):     Assets:  Desire for Improvement  Sleep:  Number of Hours: 2.5    Past Psychiatric History: Diagnosis: Alcohol dependence  Hospitalizations: [facility]  Outpatient Care: None reported  Substance Abuse Care:  [facility], [facility]  Self-Mutilation: Denies  Suicidal Attempts: Denies attempts and or thoughts  Violent Behaviors: None reported   Past Medical History:   Past Medical History  Diagnosis Date  . Alcohol abuse   . Anxiety   . Depression   . Acid reflux   . Hypertension    Cardiac History:  HTN  Allergies:  No Known Allergies  PTA Medications: No prescriptions prior to admission   Previous Psychotropic Medications:  Medication/Dose  See medication lists               Substance Abuse History in the last 12 months:  yes  Consequences of Substance Abuse: Medical Consequences:  Liver damage, Possible death by overdose Legal Consequences:  Arrests, jail time, Loss of driving privilege. Family Consequences:  Family discord, divorce and or separation.  Social History:  reports that he has been smoking Cigarettes.  He has been smoking about 0.25 packs per day. He does not have any smokeless tobacco history on file. He reports that  drinks alcohol. He reports that he uses illicit drugs (Marijuana and Cocaine). Additional Social History: History of alcohol / drug use?: Yes (started drinking when 14) Longest period of sobriety (when/how long): 90 days Negative Consequences of Use: Legal;Financial;Personal relationships;Work / Programmer, multimedia (45 days in jail for beh. 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  Legal History: "I just got released from jail 3 days ago"  Hobbies/Interests:none reported  Family History:  No family history on file.  Results for orders  placed during the hospital encounter of 02/17/13 (from the past 72 hour(s))  URINE RAPID DRUG SCREEN (HOSP PERFORMED)     Status: None   Collection Time    02/17/13  6:31 PM      Result Value Range   Opiates NONE DETECTED  NONE DETECTED   Cocaine NONE DETECTED  NONE DETECTED   Benzodiazepines NONE DETECTED  NONE DETECTED   Amphetamines NONE DETECTED  NONE DETECTED   Tetrahydrocannabinol NONE DETECTED  NONE DETECTED   Barbiturates NONE DETECTED  NONE DETECTED   Comment:            DRUG SCREEN FOR MEDICAL PURPOSES     ONLY.  IF CONFIRMATION IS NEEDED     FOR ANY PURPOSE, NOTIFY LAB     WITHIN 5 DAYS.                LOWEST DETECTABLE LIMITS     FOR URINE DRUG SCREEN     Drug Class       Cutoff (ng/mL)     Amphetamine      1000     Barbiturate      200     Benzodiazepine   200  Tricyclics       300     Opiates          300     Cocaine          300     THC              50  CBC WITH DIFFERENTIAL     Status: None   Collection Time    02/17/13  6:33 PM      Result Value Range   WBC 4.9  4.0 - 10.5 K/uL   RBC 5.06  4.22 - 5.81 MIL/uL   Hemoglobin 14.0  13.0 - 17.0 g/dL   HCT 40.9  81.1 - 91.4 %   MCV 80.8  78.0 - 100.0 fL   MCH 27.7  26.0 - 34.0 pg   MCHC 34.2  30.0 - 36.0 g/dL   RDW 78.2  95.6 - 21.3 %   Platelets 379  150 - 400 K/uL   Neutrophils Relative % 56  43 - 77 %   Neutro Abs 2.8  1.7 - 7.7 K/uL   Lymphocytes Relative 36  12 - 46 %   Lymphs Abs 1.8  0.7 - 4.0 K/uL   Monocytes Relative 7  3 - 12 %   Monocytes Absolute 0.3  0.1 - 1.0 K/uL   Eosinophils Relative 1  0 - 5 %   Eosinophils Absolute 0.0  0.0 - 0.7 K/uL   Basophils Relative 0  0 - 1 %   Basophils Absolute 0.0  0.0 - 0.1 K/uL  COMPREHENSIVE METABOLIC PANEL     Status: Abnormal   Collection Time    02/17/13  6:33 PM      Result Value Range   Sodium 138  135 - 145 mEq/L   Potassium 3.9  3.5 - 5.1 mEq/L   Chloride 100  96 - 112 mEq/L   CO2 30  19 - 32 mEq/L   Glucose, Bld 94  70 - 99 mg/dL   BUN 11   6 - 23 mg/dL   Creatinine, Ser 0.86  0.50 - 1.35 mg/dL   Calcium 8.9  8.4 - 57.8 mg/dL   Total Protein 8.6 (*) 6.0 - 8.3 g/dL   Albumin 4.1  3.5 - 5.2 g/dL   AST 60 (*) 0 - 37 U/L   ALT 37  0 - 53 U/L   Alkaline Phosphatase 129 (*) 39 - 117 U/L   Total Bilirubin 0.2 (*) 0.3 - 1.2 mg/dL   GFR calc non Af Amer >90  >90 mL/min   GFR calc Af Amer >90  >90 mL/min   Comment: (NOTE)     The eGFR has been calculated using the CKD EPI equation.     This calculation has not been validated in all clinical situations.     eGFR's persistently <90 mL/min signify possible Chronic Kidney     Disease.  ETHANOL     Status: Abnormal   Collection Time    02/17/13  6:33 PM      Result Value Range   Alcohol, Ethyl (B) 403 (*) 0 - 11 mg/dL   Comment:            LOWEST DETECTABLE LIMIT FOR     SERUM ALCOHOL IS 11 mg/dL     FOR MEDICAL PURPOSES ONLY     CRITICAL RESULT CALLED TO, READ BACK BY AND VERIFIED WITH:     BEANE RN AT 1908 ON [id number] BY DLONG  SALICYLATE  LEVEL     Status: Abnormal   Collection Time    02/17/13  6:33 PM      Result Value Range   Salicylate Lvl <2.0 (*) 2.8 - 20.0 mg/dL  ACETAMINOPHEN LEVEL     Status: None   Collection Time    02/17/13  6:33 PM      Result Value Range   Acetaminophen (Tylenol), Serum <15.0  10 - 30 ug/mL   Comment:            THERAPEUTIC CONCENTRATIONS VARY     SIGNIFICANTLY. A RANGE OF 10-30     ug/mL MAY BE AN EFFECTIVE     CONCENTRATION FOR MANY PATIENTS.     HOWEVER, SOME ARE BEST TREATED     AT CONCENTRATIONS OUTSIDE THIS     RANGE.     ACETAMINOPHEN CONCENTRATIONS     >150 ug/mL AT 4 HOURS AFTER     INGESTION AND >50 ug/mL AT 12     HOURS AFTER INGESTION ARE     OFTEN ASSOCIATED WITH TOXIC     REACTIONS.  ETHANOL     Status: Abnormal   Collection Time    02/18/13  2:25 AM      Result Value Range   Alcohol, Ethyl (B) 288 (*) 0 - 11 mg/dL   Comment:            LOWEST DETECTABLE LIMIT FOR     SERUM ALCOHOL IS 11 mg/dL     FOR MEDICAL  PURPOSES ONLY   Psychological Evaluations:  Assessment:   DSM5: Schizophrenia Disorders:  NA Obsessive-Compulsive Disorders:  NA Trauma-Stressor Disorders:  NA Substance/Addictive Disorders:  Alcohol Withdrawal (291.81), Alcohol dependence Depressive Disorders:  NA  AXIS I:  Alcohol Withdrawal (291.81), alcohol dependence AXIS II:  Deferred AXIS III:   Past Medical History  Diagnosis Date  . Alcohol abuse   . Anxiety   . Depression   . Acid reflux   . Hypertension    AXIS IV:  economic problems, occupational problems, problems related to legal system/crime and Alcoholism AXIS V:  1-10 persistent dangerousness to self and others present  Treatment Plan/Recommendations: 1. Admit for crisis management and stabilization, estimated length of stay 3-5 days.  2. Medication management to reduce current symptoms to base line and improve the patient's overall level of functioning  3. Treat health problems as indicated; (a). Benazepril 20 mg once, then 10 mh daily starting 02/20/13 4. Develop treatment plan to decrease risk of relapse upon discharge and the need for readmission.  5. Psycho-social education regarding relapse prevention and self care.  6. Health care follow up as needed for medical problems.  7. Review, reconcile, and reinstate any pertinent home medications for other health issues where appropriate. 8. Call for consults with hospitalist for any additional specialty patient care services as needed.  Treatment Plan Summary: Daily contact with patient to assess and evaluate symptoms and progress in treatment Medication management  Current Medications:  Current Facility-Administered Medications  Medication Dose Route Frequency Provider Last Rate Last Dose  . acetaminophen (TYLENOL) tablet 650 mg  650 mg Oral Q6H PRN Sanjuana Kava, NP      . alum & mag hydroxide-simeth (MAALOX/MYLANTA) 200-200-20 MG/5ML suspension 30 mL  30 mL Oral Q4H PRN Sanjuana Kava, NP      .  chlordiazePOXIDE (LIBRIUM) capsule 25 mg  25 mg Oral Q6H PRN Sanjuana Kava, NP      . chlordiazePOXIDE (LIBRIUM) capsule 25 mg  25 mg Oral QID Sanjuana Kava, NP   25 mg at 02/19/13 0804   Followed by  . [START ON 02/20/2013] chlordiazePOXIDE (LIBRIUM) capsule 25 mg  25 mg Oral TID Sanjuana Kava, NP       Followed by  . [START ON 02/21/2013] chlordiazePOXIDE (LIBRIUM) capsule 25 mg  25 mg Oral BH-qamhs Sanjuana Kava, NP       Followed by  . [START ON 02/22/2013] chlordiazePOXIDE (LIBRIUM) capsule 25 mg  25 mg Oral Daily Sanjuana Kava, NP      . hydrOXYzine (ATARAX/VISTARIL) tablet 25 mg  25 mg Oral Q6H PRN Sanjuana Kava, NP      . loperamide (IMODIUM) capsule 2-4 mg  2-4 mg Oral PRN Sanjuana Kava, NP      . magnesium hydroxide (MILK OF MAGNESIA) suspension 30 mL  30 mL Oral Daily PRN Sanjuana Kava, NP      . multivitamin with minerals tablet 1 tablet  1 tablet Oral Daily Sanjuana Kava, NP   1 tablet at 02/19/13 0805  . nicotine (NICODERM CQ - dosed in mg/24 hours) patch 14 mg  14 mg Transdermal Q0600 Sanjuana Kava, NP   14 mg at 02/19/13 0647  . ondansetron (ZOFRAN-ODT) disintegrating tablet 4 mg  4 mg Oral Q6H PRN Sanjuana Kava, NP      . Melene Muller ON 02/20/2013] pneumococcal 23 valent vaccine (PNU-IMMUNE) injection 0.5 mL  0.5 mL Intramuscular Tomorrow-1000 Sanjuana Kava, NP      . thiamine (B-1) injection 100 mg  100 mg Intramuscular Once Sanjuana Kava, NP      . Melene Muller ON 02/20/2013] thiamine (VITAMIN B-1) tablet 100 mg  100 mg Oral Daily Sanjuana Kava, NP      . traZODone (DESYREL) tablet 50 mg  50 mg Oral QHS PRN Sanjuana Kava, NP        Observation Level/Precautions:  15 minute checks  Laboratory:  Reviewed ED lab findings on file  Psychotherapy:  Group sessions  Medications:  See medication lists  Consultations:  As needed  Discharge Concerns: Maintaining Sobriety   Estimated LOS: 3-5 days  Other:     I certify that inpatient services furnished can reasonably be expected to improve the  patient's condition.   Armandina Stammer I, PMHNP-BC 9/7/201410:14 AM I have examined the patient and agreed with the findings of H&P and treatment plan.

## 2013-02-19 NOTE — Progress Notes (Signed)
Didn't attend AA  

## 2013-02-19 NOTE — Progress Notes (Signed)
Patient ID: Kevin [name], male   DOB: 1970/09/06, 42 y.o.   MRN: [id number] Psychoeducational Group Note  Date:  02/19/2013 Time:1330am  Group Topic/Focus:  Identifying Needs:   The focus of this group is to help patients identify their personal needs that have been historically problematic and identify healthy behaviors to address their needs.  Participation Level:  Active  Participation Quality:  Appropriate  Affect:  Appropriate  Cognitive:  Appropriate  Insight:  Supportive  Engagement in Group:  Supportive  Additional Comments:  The sober life-dedicating yourself to sobriety  Valente David 02/19/2013,2:40 PM

## 2013-02-19 NOTE — [facility] Group Notes (Signed)
[facility] Group Notes:  (Nursing/MHT/Case Management/Adjunct)  Date:  02/19/2013  Time:  11:49 AM  Type of Therapy:  Psychoeducational Skills  Participation Level:  Active  Participation Quality:  Appropriate  Affect:  Appropriate  Cognitive:  Appropriate  Insight:  Appropriate  Engagement in Group:  Engaged  Modes of Intervention:  Problem-solving  Summary of Progress/Problems: Pt attended healthy support systems group, and engaged in treatment.   Jacquelyne Balint Shanta 02/19/2013, 11:49 AM

## 2013-02-20 DIAGNOSIS — F411 Generalized anxiety disorder: Secondary | ICD-10-CM

## 2013-02-20 MED ORDER — CITALOPRAM HYDROBROMIDE 20 MG PO TABS
20.0000 mg | ORAL_TABLET | Freq: Every day | ORAL | Status: DC
  Administered 2013-02-20 – 2013-02-21 (×2): 20 mg via ORAL
  Filled 2013-02-20: qty 1
  Filled 2013-02-20: qty 14
  Filled 2013-02-20 (×2): qty 1
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
## 2013-02-20 NOTE — Progress Notes (Signed)
Adult Psychoeducational Group Note  Date:  02/20/2013 Time: 11:00AM Group Topic/Focus:  Wellness Toolbox:   The focus of this group is to discuss various aspects of wellness, balancing those aspects and exploring ways to increase the ability to experience wellness.  Patients will create a wellness toolbox for use upon discharge.  Participation Level:  Active  Participation Quality:  Appropriate and Attentive  Affect:  Appropriate  Cognitive:  Alert and Appropriate  Insight: Appropriate  Engagement in Group:  Engaged  Modes of Intervention:  Discussion  Additional Comments:  Pt. Was attentive and appropriate during today's group discussion. Pt. Was able to complete self care assessment. Pt shared that he need to improve he physical self care and psychological self care. Pt stated that he is willing to try and do new things so that he can better hisself.   Bing Plume D 02/20/2013, 1:28 PM

## 2013-02-20 NOTE — Progress Notes (Signed)
Pt observed lying in his bed awake.  Pt reports he is feeling tired this evening and is having some diarrhea.  Imodium was offered, but pt said he wanted to wait to see if it subsided without meds.  Pt encouraged to drink plenty of fluids.  Pt does not c/o any other withdrawal symptoms.  Pt denies SI/HI/AV.  Pt is unsure of his discharge plans at this time.  Pt makes his needs known to staff.  Support and encouragement offered.  Safety maintained with q15 minute checks.

## 2013-02-20 NOTE — Progress Notes (Signed)
Resting quietly in bed. Appears to be sleeping. No complaints.

## 2013-02-20 NOTE — [facility] Suicide Risk Assessment (Signed)
[facility] Adult Inpatient Family/Significant Other Suicide Prevention Education  Suicide Prevention Education:   Patient Refusal for Family/Significant Other Suicide Prevention Education: The patient has refused to provide written consent for family/significant other to be provided Family/Significant Other Suicide Prevention Education during admission and/or prior to discharge.  Physician notified.  CSW provided suicide prevention information with patient.    The suicide prevention education provided includes the following:  Suicide risk factors  Suicide prevention and interventions  National Suicide Hotline telephone number  [facility] assessment telephone number  [facility] Emergency Assistance 911  [facility] and/or Residential Mobile Crisis Unit telephone number   Reyes Ivan, [place] 02/20/2013 9:52 AM

## 2013-02-20 NOTE — [facility] Group Notes (Addendum)
[facility] LCSW Aftercare Discharge Planning Group Note   02/20/2013 8:45 AM  Participation Quality:  Alert and Appropriate   Mood/Affect:  Appropriate  Depression Rating:  0  Anxiety Rating:  0  Thoughts of Suicide:  Pt denies SI/HI  Will you contract for safety?   Yes  Current AVH:  Pt denies  Plan for Discharge/Comments:  Pt attended discharge planning group and actively participated in group.  CSW provided pt with today's workbook.  Pt reports a recent relapse on crack and alcohol after being released from jail.  Pt states that he has been to numerous inpatient treatment facilities for treatment, naming [facility], [facility], [facility] and [facility].  Pt states that he would like to try outpatient and plans to actually apply what he has learned.  Pt is homeless in [place] and verbalizes a plan to stay at [facility] upon d/c.  CSW also requested info on Vocational Rehab; CSW provided info to pt.  No further needs voiced by pt at this time.     Transportation Means: Pt reports access to transportation - bus pass provided  Supports: No supports mentioned at this time  Kevin [name], LCSWA 02/20/2013 9:58 AM

## 2013-02-20 NOTE — Progress Notes (Signed)
Patient ID: Kevin [name], male   DOB: 1971/06/12, 42 y.o.   MRN: [id number] He has been up and to part of the groups minimal  Interaction with peers. Self inventory: depression and hopelessness at 6, withdrawal of chilling, denies SI thoughts. He has been laying in bed this afternoon awake,smiling and talking to roommate and staff.

## 2013-02-20 NOTE — Progress Notes (Signed)
NUTRITION ASSESSMENT  Pt identified as at risk on the Malnutrition Screen Tool  INTERVENTION: 1. Educated patient on the importance of nutrition and encouraged intake of food and beverages. 2. Discussed weight goals. 3. Supplements: Continue MVI and thiamine daily  NUTRITION DIAGNOSIS: Unintentional weight loss related to sub-optimal intake as evidenced by pt report.   Goal: Pt to meet >/= 90% of their estimated nutrition needs.  Monitor:  PO intake  Assessment:  Patient admitted with ETOH abuse and depression disorder.  Patient just out of jail for etoh related offense.  Reports not eating well prior to admit secondary to dislike of jail food but reports good intake currently.  Patient with weight loss of unknown amounts.  42 y.o. male  Height: Ht Readings from Last 1 Encounters:  02/19/13 5\' 6"  (1.676 m)    Weight: Wt Readings from Last 1 Encounters:  02/19/13 146 lb (66.225 kg)    Weight Hx: Wt Readings from Last 10 Encounters:  02/19/13 146 lb (66.225 kg)  05/14/11 130 lb (58.968 kg)    BMI:  Body mass index is 23.58 kg/(m^2). Pt meets criteria for wnl based on current BMI.  Estimated Nutritional Needs: Kcal: 25-30 kcal/kg Protein: > 1 gram protein/kg Fluid: 1 ml/kcal  Diet Order: General Pt is also offered choice of unit snacks mid-morning and mid-afternoon.  Pt is eating as desired.   Lab results and medications reviewed.   Oran Rein, RD, LDN Clinical Inpatient Dietitian Pager:  [phone] Weekend and after hours pager:  [phone]

## 2013-02-20 NOTE — [facility] Group Notes (Signed)
[facility] LCSW Group Therapy  02/20/2013  1:15 PM   Type of Therapy:  Group Therapy  Participation Level:  Active  Participation Quality:  Appropriate and Attentive  Affect:  Appropriate  Cognitive:  Alert and Appropriate  Insight:  Developing/Improving and Engaged  Engagement in Therapy:  Developing/Improving and Engaged  Modes of Intervention:  Clarification, Confrontation, Discussion, Education, Exploration, Limit-setting, Orientation, Problem-solving, Rapport Building, Dance movement psychotherapist, Socialization and Support  Summary of Progress/Problems: Pt identified obstacles faced currently and processed barriers involved in overcoming these obstacles. Pt identified steps necessary for overcoming these obstacles and explored motivation (internal and external) for facing these difficulties head on. Pt further identified one area of concern in their lives and chose a goal to focus on for today.  Pt shared that his biggest obstacle is getting comfortable in his setting, which influences him to use again.  Pt explained that he is well known in [employer] and because of this is easily influenced by people, places and things.  Pt states that he knows this is the cause of his continuous relapse but continues to put himself in the same environment.  Pt considered relocating but didn't appear set on this idea at this time.  Pt was insightful and aware of his trigger.  Pt actively participated and was engaged in group discussion.    Kevin [name], LCSWA 02/20/2013 3:12 PM

## 2013-02-20 NOTE — Progress Notes (Signed)
[facility] MD Progress Note  02/20/2013 3:11 PM Kevin [name]  MRN:  [id number] Subjective:  States that he has been pretty overwhelmed  after this last relapse. He feels that his life is completely out of control. He does not think he needs to go to rehab.He just needs to apply what he learned on those rehabs.  States he needs to get a structure in place go to meetings and stay on his medications. States he has done well on Celexa and Trazodone but hat he runs out of them do not pursue them. .  Diagnosis:   DSM5: Schizophrenia Disorders:  Obsessive-Compulsive Disorders:   Trauma-Stressor Disorders:   Substance/Addictive Disorders:  Alcohol Related Disorder - Severe (303.90) Depressive Disorders:  Major Depressive Disorder - Moderate (296.22)  Axis I: Anxiety Disorder NOS  ADL's:  Intact  Sleep: Fair  Appetite:  Fair  Suicidal Ideation:  Plan:  denies Intent:  denies Means:  denies Homicidal Ideation:  Plan:  denies Intent:  denies Means:  denies AEB (as evidenced by):  Psychiatric Specialty Exam: Review of Systems  Constitutional: Negative.   HENT: Negative.   Eyes: Negative.   Respiratory: Negative.   Cardiovascular: Negative.   Gastrointestinal: Negative.   Genitourinary: Negative.   Musculoskeletal: Negative.   Skin: Negative.   Neurological: Negative.   Endo/Heme/Allergies: Negative.   Psychiatric/Behavioral: Positive for depression and substance abuse. The patient is nervous/anxious.     Blood pressure 120/81, pulse 80, temperature 98.1 F (36.7 C), temperature source Oral, resp. rate 18, height 5\' 6"  (1.676 m), weight 66.225 kg (146 lb).Body mass index is 23.58 kg/(m^2).  General Appearance: Disheveled  Eye Solicitor::  Fair  Speech:  Clear and Coherent  Volume:  Normal  Mood:  Anxious and Depressed  Affect:  anxious, depressed, worried  Thought Process:  Coherent and Goal Directed  Orientation:  Full (Time, Place, and Person)  Thought Content:  worries,  concerns  Suicidal Thoughts:  No  Homicidal Thoughts:  No  Memory:  Immediate;   Fair Recent;   Fair Remote;   Fair  Judgement:  Fair  Insight:  Present  Psychomotor Activity:  Restlessness  Concentration:  Fair  Recall:  Fair  Akathisia:  No  Handed:    AIMS (if indicated):     Assets:  Desire for Improvement  Sleep:  Number of Hours: 6.25   Current Medications: Current Facility-Administered Medications  Medication Dose Route Frequency Provider Last Rate Last Dose  . acetaminophen (TYLENOL) tablet 650 mg  650 mg Oral Q6H PRN Sanjuana Kava, NP      . alum & mag hydroxide-simeth (MAALOX/MYLANTA) 200-200-20 MG/5ML suspension 30 mL  30 mL Oral Q4H PRN Sanjuana Kava, NP      . benazepril (LOTENSIN) tablet 10 mg  10 mg Oral Daily Sanjuana Kava, NP   10 mg at 02/20/13 0807  . chlordiazePOXIDE (LIBRIUM) capsule 25 mg  25 mg Oral Q6H PRN Sanjuana Kava, NP      . chlordiazePOXIDE (LIBRIUM) capsule 25 mg  25 mg Oral TID Sanjuana Kava, NP   25 mg at 02/20/13 1144   Followed by  . [START ON 02/21/2013] chlordiazePOXIDE (LIBRIUM) capsule 25 mg  25 mg Oral BH-qamhs Sanjuana Kava, NP       Followed by  . [START ON 02/22/2013] chlordiazePOXIDE (LIBRIUM) capsule 25 mg  25 mg Oral Daily Sanjuana Kava, NP      . citalopram (CELEXA) tablet 20 mg  20 mg Oral  Daily Rachael Fee, MD   20 mg at 02/20/13 1144  . hydrOXYzine (ATARAX/VISTARIL) tablet 25 mg  25 mg Oral Q6H PRN Sanjuana Kava, NP      . loperamide (IMODIUM) capsule 2-4 mg  2-4 mg Oral PRN Sanjuana Kava, NP      . magnesium hydroxide (MILK OF MAGNESIA) suspension 30 mL  30 mL Oral Daily PRN Sanjuana Kava, NP      . multivitamin with minerals tablet 1 tablet  1 tablet Oral Daily Sanjuana Kava, NP   1 tablet at 02/20/13 0807  . nicotine (NICODERM CQ - dosed in mg/24 hours) patch 14 mg  14 mg Transdermal Q0600 Sanjuana Kava, NP   14 mg at 02/20/13 1610  . ondansetron (ZOFRAN-ODT) disintegrating tablet 4 mg  4 mg Oral Q6H PRN Sanjuana Kava, NP      .  pneumococcal 23 valent vaccine (PNU-IMMUNE) injection 0.5 mL  0.5 mL Intramuscular Tomorrow-1000 Sanjuana Kava, NP      . thiamine (B-1) injection 100 mg  100 mg Intramuscular Once Sanjuana Kava, NP      . thiamine (VITAMIN B-1) tablet 100 mg  100 mg Oral Daily Sanjuana Kava, NP   100 mg at 02/20/13 0807  . traZODone (DESYREL) tablet 50 mg  50 mg Oral QHS PRN Sanjuana Kava, NP   50 mg at 02/19/13 2144    Lab Results: No results found for this or any previous visit (from the past 48 hour(s)).  Physical Findings: AIMS: Facial and Oral Movements Muscles of Facial Expression: None, normal Lips and Perioral Area: None, normal Jaw: None, normal Tongue: None, normal,Extremity Movements Upper (arms, wrists, hands, fingers): None, normal Lower (legs, knees, ankles, toes): None, normal, Trunk Movements Neck, shoulders, hips: None, normal, Overall Severity Severity of abnormal movements (highest score from questions above): None, normal Incapacitation due to abnormal movements: None, normal Patient's awareness of abnormal movements (rate only patient's report): No Awareness, Dental Status Current problems with teeth and/or dentures?: No Does patient usually wear dentures?: No  CIWA:  CIWA-Ar Total: 2 COWS:     Treatment Plan Summary: Daily contact with patient to assess and evaluate symptoms and progress in treatment Medication management  Plan: Supportive approach/coping skills/relapse prevention           Pursue the Detox           Resume Celexa 20 mg daily         Medical Decision Making Problem Points:  Review of psycho-social stressors (1) Data Points:  Review of medication regiment & side effects (2) Review of new medications or change in dosage (2)  I certify that inpatient services furnished can reasonably be expected to improve the patient's condition.   Rikayla Demmon A 02/20/2013, 3:11 PM

## 2013-02-20 NOTE — Tx Team (Signed)
Interdisciplinary Treatment Plan Update (Adult)  Date: 02/20/2013  Time Reviewed:  9:45 AM  Progress in Treatment: Attending groups: Yes Participating in groups:  Yes Taking medication as prescribed:  Yes Tolerating medication:  Yes Family/Significant othe contact made: CSW assessing Patient understands diagnosis:  Yes Discussing patient identified problems/goals with staff:  Yes Medical problems stabilized or resolved:  Yes Denies suicidal/homicidal ideation: Yes Issues/concerns per patient self-inventory:  Yes Other:  New problem(s) identified: N/A  Discharge Plan or Barriers: CSW assessing for appropriate referrals.    Reason for Continuation of Hospitalization: Anxiety Depression Detox Medication Stabilization  Comments: N/A  Estimated length of stay: 3-5 days  For review of initial/current patient goals, please see plan of care.  Attendees: Patient:     Family:     Physician:  Dr. Dub Mikes 02/20/2013 11:00 AM   Nursing:   Roswell Miners, RN 02/20/2013 11:00 AM   Clinical Social Worker:  Reyes Ivan, LCSWA 02/20/2013 11:00 AM   Other: Dellia Cloud, RN 02/20/2013 11:00 AM   Other:  Trula Slade, LCSWA 02/20/2013 11:00 AM   Other:  Onnie Boer, RN case manager 02/20/2013 11:00 AM   Other:  Tomasita Morrow, care coordination 02/20/2013 11:01 AM   Other:    Other:    Other:    Other:    Other:    Other:     Scribe for Treatment Team:   Carmina Miller, 02/20/2013 , 11:00 AM

## 2013-02-21 DIAGNOSIS — F10239 Alcohol dependence with withdrawal, unspecified: Principal | ICD-10-CM

## 2013-02-21 DIAGNOSIS — F1020 Alcohol dependence, uncomplicated: Secondary | ICD-10-CM

## 2013-02-21 MED ORDER — TRAZODONE HCL 50 MG PO TABS
50.0000 mg | ORAL_TABLET | Freq: Every day | ORAL | Status: DC
  Filled 2013-02-21: qty 14

## 2013-02-21 MED ORDER — BENAZEPRIL HCL 10 MG PO TABS: 10.0000 mg | ORAL_TABLET | Freq: Every day | ORAL | Status: DC

## 2013-02-21 MED ORDER — CITALOPRAM HYDROBROMIDE 20 MG PO TABS: 20.0000 mg | ORAL_TABLET | Freq: Every day | ORAL | Status: DC

## 2013-02-21 MED ORDER — TRAZODONE HCL 50 MG PO TABS: ORAL_TABLET | ORAL | Status: DC

## 2013-02-21 NOTE — [facility] Suicide Risk Assessment (Signed)
Suicide Risk Assessment  Discharge Assessment     Demographic Factors:  Male  Mental Status Per Nursing Assessment::   On Admission:  NA  Current Mental Status by Physician:In full cotnact with realtiy. There are no suicial ideas, plans or intern. He is ready to be dishcarged. He is fully detox, he is ommitted to abstincene. He is pursuing outpatient treatemtnt   Loss Factors: NA  Historical Factors: NA  Risk Reduction Factors:   Living with another person, especially a relative and Positive social support  Continued Clinical Symptoms:  Alcohol/Substance Abuse/Dependencies  Cognitive Features That Contribute To Risk:  Closed-mindedness Polarized thinking Thought constriction (tunnel vision)    Suicide Risk:  Minimal: No identifiable suicidal ideation.  Patients presenting with no risk factors but with morbid ruminations; may be classified as minimal risk based on the severity of the depressive symptoms  Discharge Diagnoses:   AXIS I:  Alcohol dependence, depressive disorder NOS AXIS II:  Deferred AXIS III:   Past Medical History  Diagnosis Date  . Alcohol abuse   . Anxiety   . Depression   . Acid reflux   . Hypertension    AXIS IV:  other psychosocial or environmental problems AXIS V:  61-70 mild symptoms  Plan Of Care/Follow-up recommendations:  Activity:  as tolerated Diet:  regular Follow up [facility] Is patient on multiple antipsychotic therapies at discharge:  No   Has Patient had three or more failed trials of antipsychotic monotherapy by history:  No  Recommended Plan for Multiple Antipsychotic Therapies: NA  Paxon Propes A 02/21/2013, 1:04 PM

## 2013-02-21 NOTE — Progress Notes (Signed)
Adult Psychoeducational Group Note  Date:  02/21/2013 Time:  11:00AM Group Topic/Focus:  Recovery Goals:   The focus of this group is to identify appropriate goals for recovery and establish a plan to achieve them.  Participation Level:  Active  Participation Quality:  Appropriate and Attentive  Affect:    Cognitive:  Alert and Appropriate  Insight: Appropriate  Engagement in Group:  Engaged  Modes of Intervention:  Discussion  Additional Comments:  Pt. Was attentive and appropriate during today's group discussion. Pt was able to complete goals for recovery. Pt stated that he is going to change his environment and try to make healthier decisions on being around people that support the better choices he is trying to make.   Bing Plume D 02/21/2013, 12:12 PM

## 2013-02-21 NOTE — Progress Notes (Signed)
The focus of this group is to educate the patient on the purpose and policies of crisis stabilization and provide a format to answer questions about their admission.  The group details unit policies and expectations of patients while admitted.  Patient was an active participant.  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  He had difficulty setting short term goal, saying only that he wanted to leave today.  With inquires about his plans he set goal of trying to get a sponsor before he leaves the hospital .

## 2013-02-21 NOTE — Progress Notes (Signed)
Patient ID: Kevin [name], male   DOB: [birth year], 42 y.o.   MRN: [id number] D:  Patient discharged to home today.  All belongings retrieved from room and from the search room.  Patient given a two week supply of medications from the hospital pharmacy.  He was also given a bus pass.  He denies thoughts of suicide or self harm.   A:  Reviewed all discharge instructions, medications, and follow up care.  Patient was escorted off the unit and out to the lobby.   R:  Verbalized understanding of all discharge instructions.  Patient states he is ready to leave the hospital.

## 2013-02-21 NOTE — [facility] Group Notes (Signed)
[facility] LCSW Group Therapy  02/21/2013  1:15 PM   Type of Therapy:  Group Therapy  Participation Level:  Active  Participation Quality:  Appropriate and Attentive  Affect:  Appropriate  Cognitive:  Alert and Appropriate  Insight:  Developing/Improving and Engaged  Engagement in Therapy:  Developing/Improving and Engaged  Modes of Intervention:  Activity, Clarification, Confrontation, Discussion, Education, Exploration, Limit-setting, Orientation, Problem-solving, Rapport Building, Dance movement psychotherapist, Socialization and Support  Summary of Progress/Problems: Patient was attentive and engaged with speaker from [employer].  Patient was attentive to speaker while they shared their story of dealing with mental health and overcoming it.  Patient expressed interest in their programs and services and received information on their agency.  Patient processed ways they can relate to the speaker.     Reyes Ivan, LCSWA 02/21/2013 1:31 PM

## 2013-02-21 NOTE — Progress Notes (Signed)
[facility] Adult Case Management Discharge Plan :  Will you be returning to the same living situation after discharge: Yes,  pt was homeless prior to admission and verbalizes he will continue to be homeless.  Pt verbalizes being familiar with resources in the community.  At discharge, do you have transportation home?:Yes,  provided pt with a bus pass Do you have the ability to pay for your medications:Yes,  access to meds  Release of information consent forms completed and in the chart;  Patient's signature needed at discharge.  Patient to Follow up at: Follow-up Information   Follow up with [facility] On 02/23/2013. (Walk in Monday through Friday between 8AM-9AM for hospital followup/medication management. )    Contact information:   [address] [place], [place] [address] Phone: [phone] Fax: [phone]      Patient denies SI/HI:   Yes,  denies SI/HI    Safety Planning and Suicide Prevention discussed:  Yes,  discussed with pt.  Pt refused consent to make contact with family/friend.  See suicide prevention education note.   Carmina Miller 02/21/2013, 2:08 PM

## 2013-02-21 NOTE — Progress Notes (Signed)
Adult Psychoeducational Group Note  Date:  02/20/13 Time:  12:28 AM  Group Topic/Focus:  Wrap-Up Group:   The focus of this group is to help patients review their daily goal of treatment and discuss progress on daily workbooks.  Participation Level:  Did Not Attend  Participation Quality:    Affect:    Cognitive:    Insight:   Engagement in Group:    Modes of Intervention:    Additional Comments:    Humberto Seals Monique 02/21/2013, 12:28 AM

## 2013-02-22 NOTE — Discharge Summary (Signed)
Physician Discharge Summary Note  Patient:  Kevin [name] is an 42 y.o., male MRN:  [id number] DOB:  [birth year] Patient phone:  [phone] (home)  Patient address:   [address] [place] [address],   Date of Admission:  02/19/2013 Date of Discharge: 02/21/2013  Reason for Admission:  Alcohol detox  Discharge Diagnoses: Active Problems:   Alcohol dependence   Major depression, recurrent  ROS  DSM5: Assessment:  DSM5:  Schizophrenia Disorders: NA  Obsessive-Compulsive Disorders: NA  Trauma-Stressor Disorders: NA  Substance/Addictive Disorders: Alcohol Withdrawal (291.81), Alcohol dependence  Depressive Disorders: NA  AXIS I: Alcohol Withdrawal (291.81), alcohol dependence  AXIS II: Deferred  AXIS III:  Past Medical History   Diagnosis  Date   .  Alcohol abuse    .  Anxiety    .  Depression    .  Acid reflux    .  Hypertension     AXIS IV: economic problems, occupational problems, problems related to legal system/crime and Alcoholism  AXIS V: 1-10 persistent dangerousness to self and others present  Level of Care:  OP  Hospital Course:  Gelene Mink was admitted voluntarily from the [facility] where he presented requesting assistance with detoxing from alcohol. He was very intoxicated and his initial BAL was 403, when he was repeated it was down to 288. He had just been released from jail 3 days prior and demonstrated withdrawal symptoms felt to be significant enough to warrant admission for monitored detox. He reported a long history of alcohol abuse and a number of treatment programs.       Gelene Mink was admitted for alcohol withdrawal and crisis management.  He was treated with the standard Librium protocol for detox.         Medical problems were identified and treated.  Home medications was restarted as appropriate.  Improvement was monitored by CIWA/COWS scores and patient's daily report of withdrawal symptom reduction.              Emotional and mental status was  monitored by daily self inventory reports completed by the patient and clinical staff.        Gelene Mink was evaluated by the treatment team for stability and plans for continued recovery upon discharge.  He was offered further treatment options upon discharge including Residential, Intensive Outpatient and Outpatient treatment.         The patient's motivation was an integral factor for scheduling further treatment. Upon completion of detox the patient was both and mentally and medically stable for discharge.   Consults:  None  Significant Diagnostic Studies:  None  Discharge Vitals:   Blood pressure 127/84, pulse 96, temperature 98.4 F (36.9 C), temperature source Oral, resp. rate 20, height 5\' 6"  (1.676 m), weight 66.225 kg (146 lb). Body mass index is 23.58 kg/(m^2). Lab Results:   No results found for this or any previous visit (from the past 72 hour(s)).  Physical Findings: AIMS: Facial and Oral Movements Muscles of Facial Expression: None, normal Lips and Perioral Area: None, normal Jaw: None, normal Tongue: None, normal,Extremity Movements Upper (arms, wrists, hands, fingers): None, normal Lower (legs, knees, ankles, toes): None, normal, Trunk Movements Neck, shoulders, hips: None, normal, Overall Severity Severity of abnormal movements (highest score from questions above): None, normal Incapacitation due to abnormal movements: None, normal Patient's awareness of abnormal movements (rate only patient's report): No Awareness, Dental Status Current problems with teeth and/or dentures?: No Does patient usually wear dentures?: No  CIWA:  CIWA-Ar Total: 1 COWS:     Psychiatric Specialty Exam: See Psychiatric Specialty Exam and Suicide Risk Assessment completed by Attending Physician prior to discharge.  Discharge destination:  Home  Is patient on multiple antipsychotic therapies at discharge:  No   Has Patient had three or more failed trials of antipsychotic monotherapy by  history:  No  Recommended Plan for Multiple Antipsychotic Therapies: NA  Discharge Orders   Future Orders Complete By Expires   Diet - low sodium heart healthy  As directed    Discharge instructions  As directed    Comments:     Take all of your medications as directed. Be sure to keep all of your follow up appointments.  If you are unable to keep your follow up appointment, call your Doctor's office to let them know, and reschedule.  Make sure that you have enough medication to last until your appointment. Be sure to get plenty of rest. Going to bed at the same time each night will help. Try to avoid sleeping during the day.  Increase your activity as tolerated. Regular exercise will help you to sleep better and improve your mental health. Eating a heart healthy diet is recommended. Try to avoid salty or fried foods. Be sure to avoid all alcohol and illegal drugs.   Increase activity slowly  As directed        Medication List       Indication   benazepril 10 MG tablet  Commonly known as:  LOTENSIN  Take 1 tablet (10 mg total) by mouth daily. For hypertension.   Indication:  High Blood Pressure     citalopram 20 MG tablet  Commonly known as:  CELEXA  Take 1 tablet (20 mg total) by mouth daily. Take each day for anxiety and depression.   Indication:  Depression, anxiety     traZODone 50 MG tablet  Commonly known as:  DESYREL  Take one tablet at bedtime if needed for insomnia.   Indication:  Trouble Sleeping           Follow-up Information   Follow up with [facility] On 02/23/2013. (Walk in Monday through Friday between 8AM-9AM for hospital followup/medication management. )    Contact information:   [address], [place] [address] Phone: [phone] Fax: [phone]      Follow-up recommendations:   Activities: Resume activity as tolerated. Diet: Heart healthy low sodium diet Tests: Follow up testing will be determined by your out patient provider. Comments:    Continue to work your relapse prevention plan Total Discharge Time:  Less than 30 minutes.  Signed: MASHBURN,NEIL 02/22/2013, 2:01 PM Agree with assessment and plan Madie Reno A. Dub Mikes, M.D.

## 2013-02-24 ENCOUNTER — Encounter (HOSPITAL_COMMUNITY): Payer: Self-pay | Admitting: Emergency Medicine

## 2013-02-24 ENCOUNTER — Emergency Department (HOSPITAL_COMMUNITY)
Admission: EM | Admit: 2013-02-24 | Discharge: 2013-02-25 | Disposition: A | Discharge status: Home / Self Care | Payer: Self-pay | Attending: Emergency Medicine | Admitting: Emergency Medicine

## 2013-02-24 DIAGNOSIS — Z79899 Other long term (current) drug therapy: Secondary | ICD-10-CM | POA: Insufficient documentation

## 2013-02-24 DIAGNOSIS — F1020 Alcohol dependence, uncomplicated: Secondary | ICD-10-CM | POA: Insufficient documentation

## 2013-02-24 DIAGNOSIS — Z8719 Personal history of other diseases of the digestive system: Secondary | ICD-10-CM | POA: Insufficient documentation

## 2013-02-24 DIAGNOSIS — F17200 Nicotine dependence, unspecified, uncomplicated: Secondary | ICD-10-CM | POA: Insufficient documentation

## 2013-02-24 DIAGNOSIS — F1410 Cocaine abuse, uncomplicated: Secondary | ICD-10-CM | POA: Insufficient documentation

## 2013-02-24 DIAGNOSIS — F1310 Sedative, hypnotic or anxiolytic abuse, uncomplicated: Secondary | ICD-10-CM | POA: Insufficient documentation

## 2013-02-24 DIAGNOSIS — F3289 Other specified depressive episodes: Secondary | ICD-10-CM | POA: Insufficient documentation

## 2013-02-24 DIAGNOSIS — F411 Generalized anxiety disorder: Secondary | ICD-10-CM | POA: Insufficient documentation

## 2013-02-24 DIAGNOSIS — F10929 Alcohol use, unspecified with intoxication, unspecified: Secondary | ICD-10-CM

## 2013-02-24 DIAGNOSIS — F1210 Cannabis abuse, uncomplicated: Secondary | ICD-10-CM | POA: Insufficient documentation

## 2013-02-24 DIAGNOSIS — F329 Major depressive disorder, single episode, unspecified: Secondary | ICD-10-CM | POA: Insufficient documentation

## 2013-02-24 DIAGNOSIS — I10 Essential (primary) hypertension: Secondary | ICD-10-CM | POA: Insufficient documentation

## 2013-02-24 LAB — COMPREHENSIVE METABOLIC PANEL
ALT: 38 U/L (ref 0–53)
AST: 67 U/L — ABNORMAL HIGH (ref 0–37)
Albumin: 4 g/dL (ref 3.5–5.2)
Alkaline Phosphatase: 123 U/L — ABNORMAL HIGH (ref 39–117)
Chloride: 103 mEq/L (ref 96–112)
Potassium: 3.7 mEq/L (ref 3.5–5.1)
Sodium: 141 mEq/L (ref 135–145)
Total Bilirubin: 0.4 mg/dL (ref 0.3–1.2)
Total Protein: 8.1 g/dL (ref 6.0–8.3)

## 2013-02-24 LAB — RAPID URINE DRUG SCREEN, HOSP PERFORMED
Amphetamines: NOT DETECTED
Barbiturates: NOT DETECTED
Benzodiazepines: POSITIVE — AB
Cocaine: POSITIVE — AB
Tetrahydrocannabinol: POSITIVE — AB

## 2013-02-24 LAB — CBC
MCHC: 34.6 g/dL (ref 30.0–36.0)
Platelets: 330 10*3/uL (ref 150–400)
RDW: 13.4 % (ref 11.5–15.5)
WBC: 4.7 10*3/uL (ref 4.0–10.5)

## 2013-02-24 LAB — ACETAMINOPHEN LEVEL: Acetaminophen (Tylenol), Serum: <15 ug/mL (ref 10–30)

## 2013-02-24 LAB — ETHANOL: Alcohol, Ethyl (B): 318 mg/dL — ABNORMAL HIGH (ref 0–11)

## 2013-02-24 NOTE — ED Notes (Signed)
Per ems pt was picked up from [facility] for intoxication.

## 2013-02-24 NOTE — ED Notes (Signed)
Pt was picked up from [facility] due to alcohol intoxication. Pt reports depression, however denies SI or HI. Pt denies auditory or visual hallucinations. Pt reports having a large quantity of alcohol, however due to intoxication he is unsure of the exact quality.

## 2013-02-24 NOTE — ED Provider Notes (Signed)
CSN: [id number]     Arrival date & time 02/24/13  1704 History   First MD Initiated Contact with Patient 02/24/13 2204     Chief Complaint  Patient presents with  . Medical Clearance  . Alcohol Intoxication   HPI  History provided by the patient and EMS report. Patient is a 42 year old male with alcohol abuse, anxiety and depression presenting with alcohol intoxication. Patient was picked up at [facility] secondary to his alcohol intoxication and abuse his behavior. Patient was complaining of some depressed feelings but has denied any SI or HI. He states that he is "drunk". Admits to heavy alcohol use. He has a history of daily alcohol use. He also reports smoking crack cocaine with 2 other ladies. He has no other complaints. Denies any chest pains, shortness of breath, abdominal pains, nausea or vomiting. At this time he has not expressed desires for alcohol detox. He states he just wants to sleep.    Past Medical History  Diagnosis Date  . Alcohol abuse   . Anxiety   . Depression   . Acid reflux   . Hypertension    Past Surgical History  Procedure Laterality Date  . Alcohol abuse    . Left leg surgery    . Facial reconstruction surgery      d/t facial fx's   No family history on file. History  Substance Use Topics  . Smoking status: Current Every Day Smoker -- 0.25 packs/day    Types: Cigarettes  . Smokeless tobacco: Not on file  . Alcohol Use: 0.0 oz/week    2-3 Glasses of wine per week     Comment: drink wine constantly all day    Review of Systems  Constitutional: Negative for fever.  Respiratory: Negative for shortness of breath.   Cardiovascular: Negative for chest pain.  Gastrointestinal: Negative for nausea, vomiting and abdominal pain.  All other systems reviewed and are negative.    Allergies  Review of patient's allergies indicates no known allergies.  Home Medications   Current Outpatient Rx  Name  Route  Sig  Dispense  Refill  . benazepril  (LOTENSIN) 10 MG tablet   Oral   Take 1 tablet (10 mg total) by mouth daily. For hypertension.   30 tablet   0   . citalopram (CELEXA) 20 MG tablet   Oral   Take 1 tablet (20 mg total) by mouth daily. Take each day for anxiety and depression.   30 tablet   0   . traZODone (DESYREL) 50 MG tablet      Take one tablet at bedtime if needed for insomnia.   30 tablet   0    BP 128/88  Pulse 84  Temp(Src) 98.9 F (37.2 C) (Oral)  Resp 16  SpO2 95% Physical Exam  Nursing note and vitals reviewed. Constitutional: He is oriented to person, place, and time. He appears well-developed and well-nourished.  HENT:  Head: Normocephalic and atraumatic.  Breath smells of alcohol  Cardiovascular: Normal rate and regular rhythm.   Pulmonary/Chest: Effort normal and breath sounds normal. No respiratory distress. He has no wheezes.  Abdominal: Soft. He exhibits no distension. There is no tenderness. There is no rebound.  Musculoskeletal: Normal range of motion.  Neurological: He is alert and oriented to person, place, and time.  Skin: Skin is warm.  Psychiatric: He has a normal mood and affect. His behavior is normal.    ED Course  Procedures  Results for orders placed during  the hospital encounter of 02/24/13  ACETAMINOPHEN LEVEL      Result Value Range   Acetaminophen (Tylenol), Serum <15.0  10 - 30 ug/mL  CBC      Result Value Range   WBC 4.7  4.0 - 10.5 K/uL   RBC 4.57  4.22 - 5.81 MIL/uL   Hemoglobin 12.8 (*) 13.0 - 17.0 g/dL   HCT 95.6 (*) 21.3 - 08.6 %   MCV 81.0  78.0 - 100.0 fL   MCH 28.0  26.0 - 34.0 pg   MCHC 34.6  30.0 - 36.0 g/dL   RDW 57.8  46.9 - 62.9 %   Platelets 330  150 - 400 K/uL  COMPREHENSIVE METABOLIC PANEL      Result Value Range   Sodium 141  135 - 145 mEq/L   Potassium 3.7  3.5 - 5.1 mEq/L   Chloride 103  96 - 112 mEq/L   CO2 27  19 - 32 mEq/L   Glucose, Bld 92  70 - 99 mg/dL   BUN 11  6 - 23 mg/dL   Creatinine, Ser 5.28  0.50 - 1.35 mg/dL    Calcium 9.0  8.4 - 41.3 mg/dL   Total Protein 8.1  6.0 - 8.3 g/dL   Albumin 4.0  3.5 - 5.2 g/dL   AST 67 (*) 0 - 37 U/L   ALT 38  0 - 53 U/L   Alkaline Phosphatase 123 (*) 39 - 117 U/L   Total Bilirubin 0.4  0.3 - 1.2 mg/dL   GFR calc non Af Amer >90  >90 mL/min   GFR calc Af Amer >90  >90 mL/min  ETHANOL      Result Value Range   Alcohol, Ethyl (B) 318 (*) 0 - 11 mg/dL  SALICYLATE LEVEL      Result Value Range   Salicylate Lvl <2.0 (*) 2.8 - 20.0 mg/dL  URINE RAPID DRUG SCREEN (HOSP PERFORMED)      Result Value Range   Opiates NONE DETECTED  NONE DETECTED   Cocaine POSITIVE (*) NONE DETECTED   Benzodiazepines POSITIVE (*) NONE DETECTED   Amphetamines NONE DETECTED  NONE DETECTED   Tetrahydrocannabinol POSITIVE (*) NONE DETECTED   Barbiturates NONE DETECTED  NONE DETECTED         MDM   1. Alcohol intoxication   2. Alcohol dependence       Patient seen and evaluated. Patient clinically appears intoxicated and admits to heavy alcohol use. Alcohol level greater than 300. Patient denies any SI or HI. We'll wait for him to sober up and reassess.  Patient has been able to sober and sleep through the night. He is now clinically sober walking without any assistance the bathroom. He is stable for discharge.  Angus Seller, PA-C 02/25/13 [phone]

## 2013-02-24 NOTE — Progress Notes (Signed)
Patient Discharge Instructions:  After Visit Summary (AVS):   Faxed to:  02/24/13 Discharge Summary Note:   Faxed to:  02/24/13 Psychiatric Admission Assessment Note:   Faxed to:  02/24/13 Suicide Risk Assessment - Discharge Assessment:   Faxed to:  02/24/13 Faxed/Sent to the Next Level Care provider:  02/24/13 Faxed to [facility] @ [phone]  Jerelene Redden, 02/24/2013, 4:56 PM

## 2013-02-25 NOTE — ED Provider Notes (Signed)
Medical screening examination/treatment/procedure(s) were performed by non-physician practitioner and as supervising physician I was immediately available for consultation/collaboration.   Claudean Kinds, MD 02/25/13 [phone]

## 2013-03-05 ENCOUNTER — Encounter (HOSPITAL_COMMUNITY): Payer: Self-pay | Admitting: Emergency Medicine

## 2013-03-05 ENCOUNTER — Emergency Department (HOSPITAL_COMMUNITY)
Admission: EM | Admit: 2013-03-05 | Discharge: 2013-03-05 | Disposition: A | Discharge status: Home / Self Care | Payer: Self-pay | Attending: Emergency Medicine | Admitting: Emergency Medicine

## 2013-03-05 DIAGNOSIS — Z8719 Personal history of other diseases of the digestive system: Secondary | ICD-10-CM | POA: Insufficient documentation

## 2013-03-05 DIAGNOSIS — F1410 Cocaine abuse, uncomplicated: Secondary | ICD-10-CM | POA: Insufficient documentation

## 2013-03-05 DIAGNOSIS — F1910 Other psychoactive substance abuse, uncomplicated: Secondary | ICD-10-CM

## 2013-03-05 DIAGNOSIS — F329 Major depressive disorder, single episode, unspecified: Secondary | ICD-10-CM | POA: Insufficient documentation

## 2013-03-05 DIAGNOSIS — I10 Essential (primary) hypertension: Secondary | ICD-10-CM | POA: Insufficient documentation

## 2013-03-05 DIAGNOSIS — F411 Generalized anxiety disorder: Secondary | ICD-10-CM | POA: Insufficient documentation

## 2013-03-05 DIAGNOSIS — F1310 Sedative, hypnotic or anxiolytic abuse, uncomplicated: Secondary | ICD-10-CM | POA: Insufficient documentation

## 2013-03-05 DIAGNOSIS — F17200 Nicotine dependence, unspecified, uncomplicated: Secondary | ICD-10-CM | POA: Insufficient documentation

## 2013-03-05 DIAGNOSIS — R45851 Suicidal ideations: Secondary | ICD-10-CM | POA: Insufficient documentation

## 2013-03-05 DIAGNOSIS — Z79899 Other long term (current) drug therapy: Secondary | ICD-10-CM | POA: Insufficient documentation

## 2013-03-05 DIAGNOSIS — F3289 Other specified depressive episodes: Secondary | ICD-10-CM | POA: Insufficient documentation

## 2013-03-05 LAB — COMPREHENSIVE METABOLIC PANEL
ALT: 31 U/L (ref 0–53)
AST: 59 U/L — ABNORMAL HIGH (ref 0–37)
BUN: 10 mg/dL (ref 6–23)
CO2: 26 mEq/L (ref 19–32)
Calcium: 8.5 mg/dL (ref 8.4–10.5)
Creatinine, Ser: 0.75 mg/dL (ref 0.50–1.35)
GFR calc Af Amer: >90 mL/min (ref >90)
GFR calc non Af Amer: >90 mL/min (ref >90)
Glucose, Bld: 95 mg/dL (ref 70–99)
Sodium: 142 mEq/L (ref 135–145)
Total Protein: 8 g/dL (ref 6.0–8.3)

## 2013-03-05 LAB — RAPID URINE DRUG SCREEN, HOSP PERFORMED
Amphetamines: NOT DETECTED
Cocaine: POSITIVE — AB
Opiates: NOT DETECTED

## 2013-03-05 LAB — CBC
HCT: 35.6 % — ABNORMAL LOW (ref 39.0–52.0)
MCH: 27.4 pg (ref 26.0–34.0)
MCHC: 34 g/dL (ref 30.0–36.0)
MCV: 80.7 fL (ref 78.0–100.0)
Platelets: 302 10*3/uL (ref 150–400)
RBC: 4.41 MIL/uL (ref 4.22–5.81)

## 2013-03-05 LAB — SALICYLATE LEVEL: Salicylate Lvl: <2 mg/dL — ABNORMAL LOW (ref 2.8–20.0)

## 2013-03-05 MED ORDER — POTASSIUM CHLORIDE CRYS ER 20 MEQ PO TBCR
40.0000 meq | EXTENDED_RELEASE_TABLET | Freq: Once | ORAL | Status: AC
  Administered 2013-03-05: 40 meq via ORAL
  Filled 2013-03-05: qty 2

## 2013-03-05 NOTE — ED Notes (Signed)
Pt continually coming into hall and to nursing station, stating he wants to go to [facility] or he is going to kill himself. [employer] and security at bedside.

## 2013-03-05 NOTE — ED Notes (Signed)
Pt reports he is suicidal, but "I don't want to be stuck here. I just need to get to [facility]." Pt reports to being intoxicated and smoking crack tonight.

## 2013-03-05 NOTE — ED Notes (Signed)
Pt states he is suicidal and wants to be taken "across the street" pt provided paper scrubs and ask for urine sample

## 2013-03-05 NOTE — ED Provider Notes (Signed)
CSN: [id number]     Arrival date & time 03/05/13  0224 History   First MD Initiated Contact with Patient 03/05/13 [phone]     Chief Complaint  Patient presents with  . Medical Clearance   (Consider location/radiation/quality/duration/timing/severity/associated sxs/prior Treatment) HPI History provided by patient. Initially presented stating that he was suicidal after smoking crack tonight and wanted to be admitted to behavioral health. At time of my valuation patient states that "I'm drunk and need a ride to [employer]".  He denies any suicidal ideation or self injury. He admits to drug use. He denies any hallucinations or homicidal ideation. Patient is jovial and joking with staff asks multiple times if he can have a ride to [facility] to see "his girl".  Past Medical History  Diagnosis Date  . Alcohol abuse   . Anxiety   . Depression   . Acid reflux   . Hypertension    Past Surgical History  Procedure Laterality Date  . Alcohol abuse    . Left leg surgery    . Facial reconstruction surgery      d/t facial fx's   History reviewed. No pertinent family history. History  Substance Use Topics  . Smoking status: Current Every Day Smoker -- 0.25 packs/day    Types: Cigarettes  . Smokeless tobacco: Not on file  . Alcohol Use: 0.0 oz/week    2-3 Glasses of wine per week     Comment: drink wine constantly all day    Review of Systems  Constitutional: Negative for fever and chills.  HENT: Negative for neck pain and neck stiffness.   Eyes: Negative for pain.  Respiratory: Negative for shortness of breath.   Cardiovascular: Negative for chest pain.  Gastrointestinal: Negative for abdominal pain.  Genitourinary: Negative for dysuria.  Musculoskeletal: Negative for back pain.  Skin: Negative for rash.  Neurological: Negative for headaches.  All other systems reviewed and are negative.    Allergies  Review of patient's allergies indicates no known allergies.  Home Medications    Current Outpatient Rx  Name  Route  Sig  Dispense  Refill  . benazepril (LOTENSIN) 10 MG tablet   Oral   Take 1 tablet (10 mg total) by mouth daily. For hypertension.   30 tablet   0   . citalopram (CELEXA) 20 MG tablet   Oral   Take 1 tablet (20 mg total) by mouth daily. Take each day for anxiety and depression.   30 tablet   0   . traZODone (DESYREL) 50 MG tablet      Take one tablet at bedtime if needed for insomnia.   30 tablet   0    BP 123/101  Pulse 93  Temp(Src) 98.2 F (36.8 C) (Oral)  Resp 16  Ht 5\' 7"  (1.702 m)  Wt 146 lb (66.225 kg)  BMI 22.86 kg/m2  SpO2 97% Physical Exam  Constitutional: He is oriented to person, place, and time. He appears well-developed and well-nourished.  HENT:  Head: Normocephalic and atraumatic.  Eyes: EOM are normal. Pupils are equal, round, and reactive to light.  Neck: Neck supple.  Cardiovascular: Regular rhythm and intact distal pulses.   Pulmonary/Chest: Effort normal. No respiratory distress.  Musculoskeletal: Normal range of motion. He exhibits no edema.  Neurological: He is alert and oriented to person, place, and time. No cranial nerve deficit.  Steady gait, speech is clear, no focal deficits  Skin: Skin is warm and dry.  Psychiatric:  Upbeat jovial affect  without active suicidal ideation    ED Course  Procedures (including critical care time) Labs Review Labs Reviewed  CBC - Abnormal; Notable for the following:    Hemoglobin 12.1 (*)    HCT 35.6 (*)    All other components within normal limits  COMPREHENSIVE METABOLIC PANEL - Abnormal; Notable for the following:    Potassium 3.3 (*)    AST 59 (*)    Alkaline Phosphatase 118 (*)    All other components within normal limits  ETHANOL - Abnormal; Notable for the following:    Alcohol, Ethyl (B) 398 (*)    All other components within normal limits  SALICYLATE LEVEL - Abnormal; Notable for the following:    Salicylate Lvl <2.0 (*)    All other components  within normal limits  URINE RAPID DRUG SCREEN (HOSP PERFORMED) - Abnormal; Notable for the following:    Cocaine POSITIVE (*)    Benzodiazepines POSITIVE (*)    All other components within normal limits  ACETAMINOPHEN LEVEL   Serial evaluations, no suicidal ideation. Patient encouraged to call for a ride. He was given outpatient referrals and resources for alcohol and polysubstance abuse. No indication for IVC your psychiatric admission at this time.  MDM   1. Polysubstance abuse     labs and UDS reviewed as above Previous records and vital signs noted    Sunnie Nielsen, MD 03/05/13 [phone]
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
## 2013-03-28 ENCOUNTER — Emergency Department (HOSPITAL_COMMUNITY)
Admission: EM | Admit: 2013-03-28 | Discharge: 2013-03-29 | Disposition: A | Discharge status: Home / Self Care | Payer: Self-pay | Attending: Emergency Medicine | Admitting: Emergency Medicine

## 2013-03-28 ENCOUNTER — Encounter (HOSPITAL_COMMUNITY): Payer: Self-pay | Admitting: Emergency Medicine

## 2013-03-28 DIAGNOSIS — F3289 Other specified depressive episodes: Secondary | ICD-10-CM | POA: Insufficient documentation

## 2013-03-28 DIAGNOSIS — I10 Essential (primary) hypertension: Secondary | ICD-10-CM | POA: Insufficient documentation

## 2013-03-28 DIAGNOSIS — Z8719 Personal history of other diseases of the digestive system: Secondary | ICD-10-CM | POA: Insufficient documentation

## 2013-03-28 DIAGNOSIS — R110 Nausea: Secondary | ICD-10-CM | POA: Insufficient documentation

## 2013-03-28 DIAGNOSIS — F17200 Nicotine dependence, unspecified, uncomplicated: Secondary | ICD-10-CM | POA: Insufficient documentation

## 2013-03-28 DIAGNOSIS — F329 Major depressive disorder, single episode, unspecified: Secondary | ICD-10-CM | POA: Insufficient documentation

## 2013-03-28 DIAGNOSIS — F1010 Alcohol abuse, uncomplicated: Secondary | ICD-10-CM | POA: Insufficient documentation

## 2013-03-28 DIAGNOSIS — Z79899 Other long term (current) drug therapy: Secondary | ICD-10-CM | POA: Insufficient documentation

## 2013-03-28 DIAGNOSIS — F411 Generalized anxiety disorder: Secondary | ICD-10-CM | POA: Insufficient documentation

## 2013-03-28 LAB — CBC WITH DIFFERENTIAL/PLATELET
Basophils Absolute: 0 10*3/uL (ref 0.0–0.1)
Basophils Relative: 1 % (ref 0–1)
Eosinophils Absolute: 0 10*3/uL (ref 0.0–0.7)
Eosinophils Relative: 1 % (ref 0–5)
Hemoglobin: 12.7 g/dL — ABNORMAL LOW (ref 13.0–17.0)
Lymphs Abs: 1.6 10*3/uL (ref 0.7–4.0)
MCH: 27 pg (ref 26.0–34.0)
MCHC: 33.9 g/dL (ref 30.0–36.0)
MCV: 79.6 fL (ref 78.0–100.0)
Monocytes Absolute: 0.3 10*3/uL (ref 0.1–1.0)
Monocytes Relative: 9 % (ref 3–12)
Platelets: 358 10*3/uL (ref 150–400)
RBC: 4.71 MIL/uL (ref 4.22–5.81)
RDW: 15.9 % — ABNORMAL HIGH (ref 11.5–15.5)

## 2013-03-28 LAB — COMPREHENSIVE METABOLIC PANEL
AST: 41 U/L — ABNORMAL HIGH (ref 0–37)
Alkaline Phosphatase: 116 U/L (ref 39–117)
BUN: 5 mg/dL — ABNORMAL LOW (ref 6–23)
Calcium: 9.3 mg/dL (ref 8.4–10.5)
Chloride: 99 mEq/L (ref 96–112)
Creatinine, Ser: 0.86 mg/dL (ref 0.50–1.35)
GFR calc Af Amer: >90 mL/min (ref >90)
Glucose, Bld: 81 mg/dL (ref 70–99)
Total Protein: 7.7 g/dL (ref 6.0–8.3)

## 2013-03-28 LAB — RAPID URINE DRUG SCREEN, HOSP PERFORMED
Amphetamines: NOT DETECTED
Barbiturates: NOT DETECTED
Cocaine: POSITIVE — AB
Opiates: NOT DETECTED
Tetrahydrocannabinol: NOT DETECTED

## 2013-03-28 MED ORDER — ACETAMINOPHEN 325 MG PO TABS: 650.0000 mg | ORAL_TABLET | ORAL | Status: DC | PRN

## 2013-03-28 MED ORDER — LORAZEPAM 1 MG PO TABS
0.0000 mg | ORAL_TABLET | Freq: Two times a day (BID) | ORAL | Status: DC
  Administered 2013-03-28: 1 mg via ORAL

## 2013-03-28 MED ORDER — ONDANSETRON HCL 4 MG PO TABS: 4.0000 mg | ORAL_TABLET | Freq: Three times a day (TID) | ORAL | Status: DC | PRN

## 2013-03-28 MED ORDER — THIAMINE HCL 100 MG/ML IJ SOLN: 100.0000 mg | Freq: Every day | INTRAMUSCULAR | Status: DC

## 2013-03-28 MED ORDER — CITALOPRAM HYDROBROMIDE 20 MG PO TABS
20.0000 mg | ORAL_TABLET | Freq: Every day | ORAL | Status: DC
  Administered 2013-03-28 – 2013-03-29 (×2): 20 mg via ORAL
  Filled 2013-03-28 (×3): qty 1

## 2013-03-28 MED ORDER — IBUPROFEN 200 MG PO TABS: 600.0000 mg | ORAL_TABLET | Freq: Three times a day (TID) | ORAL | Status: DC | PRN

## 2013-03-28 MED ORDER — VITAMIN B-1 100 MG PO TABS
100.0000 mg | ORAL_TABLET | Freq: Every day | ORAL | Status: DC
  Administered 2013-03-28 – 2013-03-29 (×2): 100 mg via ORAL
  Filled 2013-03-28 (×2): qty 1

## 2013-03-28 MED ORDER — LORAZEPAM 1 MG PO TABS
0.0000 mg | ORAL_TABLET | Freq: Four times a day (QID) | ORAL | Status: DC
  Filled 2013-03-28: qty 1

## 2013-03-28 MED ORDER — TRAZODONE HCL 50 MG PO TABS
50.0000 mg | ORAL_TABLET | Freq: Once | ORAL | Status: AC
  Administered 2013-03-28: 50 mg via ORAL
  Filled 2013-03-28: qty 1

## 2013-03-28 MED ORDER — NICOTINE 21 MG/24HR TD PT24
21.0000 mg | MEDICATED_PATCH | Freq: Every day | TRANSDERMAL | Status: DC
  Administered 2013-03-28: 21 mg via TRANSDERMAL
  Filled 2013-03-28: qty 1

## 2013-03-28 MED ORDER — BENAZEPRIL HCL 10 MG PO TABS
10.0000 mg | ORAL_TABLET | Freq: Every day | ORAL | Status: DC
  Administered 2013-03-28 – 2013-03-29 (×2): 10 mg via ORAL
  Filled 2013-03-28 (×3): qty 1
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
## 2013-03-28 NOTE — ED Notes (Addendum)
Pt has 4 bags of belongings. Backpack( cosmetics,1 pr of shoes,  Headphones, 2 phone chargers, salt shaker, condoms) large baseball bag( 2 cellphones, pocketknife, food,),1 bag of boots, 1bag( pants,shirts,  Wallet with ID. Socks, 2 shirts, bracelets and hair ties).  Pt has been seen and wanded by security.

## 2013-03-28 NOTE — ED Provider Notes (Signed)
CSN: [id number]     Arrival date & time 03/28/13  1840 History  This chart was scribed for Raeford Razor, MD by Ardelia Mems, ED Scribe. This patient was seen in room WTR2/WLPT2 and the patient's care was started at 8:13 PM.   Chief Complaint  Patient presents with  . Alcohol Intoxication    The history is provided by the patient and the police. The history is limited by the condition of the patient. No language interpreter was used.   Level 5 Caveat (Alcohol Intoxication): Unable to obtain HPI or ROS  HPI Comments: Kevin [name] is a 42 y.o. male brought by [employer] to the Emergency Department requesting detox from alcohol, "stating that I can't quit on my own". He states that he is here willingly to seek treatment. He states that he typically drinks 8 beers a day, and that he has been drinking "for a long time". He states that he becomes nauseated without alcohol, and that he reports nausea currently for this reason. He states that he is currently on Trazodone and Celexa. He is repeatedly asking for food, and states that he hasn't eaten in 2 days. He denies any other pain or symptoms.   Past Medical History  Diagnosis Date  . Alcohol abuse   . Anxiety   . Depression   . Acid reflux   . Hypertension    Past Surgical History  Procedure Laterality Date  . Alcohol abuse    . Left leg surgery    . Facial reconstruction surgery      d/t facial fx's   History reviewed. No pertinent family history. History  Substance Use Topics  . Smoking status: Current Every Day Smoker -- 0.25 packs/day    Types: Cigarettes  . Smokeless tobacco: Not on file  . Alcohol Use: 0.0 oz/week    2-3 Glasses of wine per week     Comment: drink wine constantly all day    Review of Systems  Unable to perform ROS: Other  Other : Alcohol Intoxication   Allergies  Review of patient's allergies indicates no known allergies.  Home Medications   Current Outpatient Rx  Name  Route  Sig  Dispense   Refill  . benazepril (LOTENSIN) 10 MG tablet   Oral   Take 1 tablet (10 mg total) by mouth daily. For hypertension.   30 tablet   0   . citalopram (CELEXA) 20 MG tablet   Oral   Take 1 tablet (20 mg total) by mouth daily. Take each day for anxiety and depression.   30 tablet   0   . traZODone (DESYREL) 50 MG tablet      Take one tablet at bedtime if needed for insomnia.   30 tablet   0    Triage Vitals: BP 142/89  Pulse 74  Temp(Src) 98.5 F (36.9 C) (Oral)  Resp 24  SpO2 98%  Physical Exam  Nursing note and vitals reviewed. Constitutional: He is oriented to person, place, and time. He appears well-developed and well-nourished. No distress.  HENT:  Head: Normocephalic and atraumatic.  Eyes: EOM are normal.  Neck: Neck supple. No tracheal deviation present.  Cardiovascular: Normal rate.   Pulmonary/Chest: Effort normal. No respiratory distress.  Musculoskeletal: Normal range of motion.  Neurological: He is alert and oriented to person, place, and time.  Skin: Skin is warm and dry.  Psychiatric: His behavior is normal. He exhibits a depressed mood. He expresses no homicidal and no suicidal ideation.  Intoxicated and disheveled     ED Course  Procedures (including critical care time)  DIAGNOSTIC STUDIES: Oxygen Saturation is 98% on RA, normal by my interpretation.    COORDINATION OF CARE: 8:20 PM- Pt advised of plan for treatment and pt agrees.  Medications  acetaminophen (TYLENOL) tablet 650 mg (not administered)  ibuprofen (ADVIL,MOTRIN) tablet 600 mg (not administered)  nicotine (NICODERM CQ - dosed in mg/24 hours) patch 21 mg (not administered)  ondansetron (ZOFRAN) tablet 4 mg (not administered)  LORazepam (ATIVAN) tablet 0-4 mg (not administered)    Followed by  LORazepam (ATIVAN) tablet 0-4 mg (not administered)  thiamine (VITAMIN B-1) tablet 100 mg (not administered)    Or  thiamine (B-1) injection 100 mg (not administered)  benazepril (LOTENSIN)  tablet 10 mg (not administered)  citalopram (CELEXA) tablet 20 mg (not administered)  traZODone (DESYREL) tablet 50 mg (not administered)   Labs Review Labs Reviewed  URINE RAPID DRUG SCREEN (HOSP PERFORMED) - Abnormal; Notable for the following:    Cocaine POSITIVE (*)    All other components within normal limits  CBC WITH DIFFERENTIAL  COMPREHENSIVE METABOLIC PANEL  ETHANOL   Imaging Review No results found.  MDM   1. Alcohol abuse    Holding orders placed Screening labs pending TTS consult placed. Alcohol detox protocol placed. Filed Vitals:   03/28/13 1923  BP: 142/89  Pulse: 74  Temp: 98.5 F (36.9 C)  Resp: 24     I personally performed the services described in this documentation, which was scribed in my presence. The recorded information has been reviewed and is accurate.   Dorthula Matas, PA-C 03/28/13 2031

## 2013-03-28 NOTE — ED Notes (Signed)
Pt here for etoh detox, was going to jail but decided that he wanted help instead.

## 2013-03-29 ENCOUNTER — Encounter (HOSPITAL_COMMUNITY): Payer: Self-pay | Admitting: Registered Nurse

## 2013-03-29 DIAGNOSIS — F1010 Alcohol abuse, uncomplicated: Secondary | ICD-10-CM

## 2013-03-29 NOTE — Consult Note (Signed)
[facility] Face-to-Face Psychiatry Consult   Reason for Consult:  Evaluation for inpatient treatment Referring Physician:  EDP  Kevin [name] is an 42 y.o. male.  Assessment: AXIS I:  Alcohol Abuse AXIS II:  Deferred AXIS III:   Past Medical History  Diagnosis Date  . Alcohol abuse   . Anxiety   . Depression   . Acid reflux   . Hypertension    AXIS IV:  other psychosocial or environmental problems AXIS V:  51-60 moderate symptoms  Plan:  No evidence of imminent risk to self or others at present.   Patient does not meet criteria for psychiatric inpatient admission. Refer to IOP. Discussed crisis plan, support from social network, calling 911, coming to the Emergency Department, and calling Suicide Hotline.  Subjective:   Kevin [name] is a 42 y.o. male.  HPI:  Patient states that he had drank to much last night.  "I just had to much to drink.  I just needed to sober up.  I don't need detox. I'll be abe to function just fine.  I have Celexa that I take for my depression but when I'm drinking I don't take it."  Patient states that he goes to [facility] for out patient services.   Patient states that he is stay at the [facility] and is unemployed.  Patient did say that he would like to be given information of rehab services in the area but not interested in doing right now.  "I'll look into it.  I can do it on my own; I just need to get around some different people."   Past Psychiatric History: Past Medical History  Diagnosis Date  . Alcohol abuse   . Anxiety   . Depression   . Acid reflux   . Hypertension     reports that he has been smoking Cigarettes.  He has been smoking about 0.25 packs per day. He does not have any smokeless tobacco history on file. He reports that he drinks alcohol. He reports that he uses illicit drugs (Marijuana and Cocaine). History reviewed. No pertinent family history. Family History Substance Abuse: Yes, Describe: (Pt reports that deceased  father drank himself to death) Family Supports: No Living Arrangements: Other (Comment) (Pt homeless) Can pt return to current living arrangement?: Yes Abuse/Neglect [facility]) Physical Abuse: Denies Verbal Abuse: Denies Sexual Abuse: Denies Allergies:  No Known Allergies  ACT Assessment Complete:  Yes:    Educational Status    Risk to Self: Risk to self Suicidal Ideation: No Suicidal Intent: No Is patient at risk for suicide?: No Suicidal Plan?: No Access to Means: No What has been your use of drugs/alcohol within the last 12 months?: ETOH & Cocaine daily use Previous Attempts/Gestures: No How many times?: 0 Other Self Harm Risks: SA issues Triggers for Past Attempts: None known Intentional Self Injurious Behavior: None Family Suicide History: No Recent stressful life event(s):  (None) Persecutory voices/beliefs?: No Depression: Yes Depression Symptoms: Despondent;Feeling worthless/self pity;Fatigue Substance abuse history and/or treatment for substance abuse?: Yes Suicide prevention information given to non-admitted patients: Not applicable  Risk to Others: Risk to Others Homicidal Ideation: No Thoughts of Harm to Others: No Current Homicidal Intent: No Current Homicidal Plan: No Access to Homicidal Means: No Identified Victim: No one History of harm to others?: No Assessment of Violence: None Noted Violent Behavior Description: N/A Does patient have access to weapons?: No Criminal Charges Pending?: No Does patient have a court date: No  Abuse: Abuse/Neglect Assessment (Assessment  to be complete while patient is alone) Physical Abuse: Denies Verbal Abuse: Denies Sexual Abuse: Denies Exploitation of patient/patient's resources: Denies Self-Neglect: Denies  Prior Inpatient Therapy: Prior Inpatient Therapy Prior Inpatient Therapy: Yes Prior Therapy Dates: January '14 Prior Therapy Facilty/Provider(s): [facility] Reason for Treatment: Detox  Prior Outpatient Therapy: Prior  Outpatient Therapy Prior Outpatient Therapy: No Prior Therapy Dates: N/a Prior Therapy Facilty/Provider(s): Pt did not know Reason for Treatment: Did not know  Additional Information: Additional Information 1:1 In Past 12 Months?: No CIRT Risk: No Elopement Risk: No Does patient have medical clearance?: Yes                  Objective: Blood pressure 94/57, pulse 58, temperature 98.6 F (37 C), temperature source Oral, resp. rate 18, SpO2 99.00%.There is no weight on file to calculate BMI. Results for orders placed during the hospital encounter of 03/28/13 (from the past 72 hour(s))  URINE RAPID DRUG SCREEN (HOSP PERFORMED)     Status: Abnormal   Collection Time    03/28/13  7:47 PM      Result Value Range   Opiates NONE DETECTED  NONE DETECTED   Cocaine POSITIVE (*) NONE DETECTED   Benzodiazepines NONE DETECTED  NONE DETECTED   Amphetamines NONE DETECTED  NONE DETECTED   Tetrahydrocannabinol NONE DETECTED  NONE DETECTED   Barbiturates NONE DETECTED  NONE DETECTED   Comment:            DRUG SCREEN FOR MEDICAL PURPOSES     ONLY.  IF CONFIRMATION IS NEEDED     FOR ANY PURPOSE, NOTIFY LAB     WITHIN 5 DAYS.                LOWEST DETECTABLE LIMITS     FOR URINE DRUG SCREEN     Drug Class       Cutoff (ng/mL)     Amphetamine      1000     Barbiturate      200     Benzodiazepine   200     Tricyclics       300     Opiates          300     Cocaine          300     THC              50  CBC WITH DIFFERENTIAL     Status: Abnormal   Collection Time    03/28/13  8:05 PM      Result Value Range   WBC 3.5 (*) 4.0 - 10.5 K/uL   RBC 4.71  4.22 - 5.81 MIL/uL   Hemoglobin 12.7 (*) 13.0 - 17.0 g/dL   HCT 16.1 (*) 09.6 - 04.5 %   MCV 79.6  78.0 - 100.0 fL   MCH 27.0  26.0 - 34.0 pg   MCHC 33.9  30.0 - 36.0 g/dL   RDW 40.9 (*) 81.1 - 91.4 %   Platelets 358  150 - 400 K/uL   Neutrophils Relative % 45  43 - 77 %   Neutro Abs 1.6 (*) 1.7 - 7.7 K/uL   Lymphocytes Relative  45  12 - 46 %   Lymphs Abs 1.6  0.7 - 4.0 K/uL   Monocytes Relative 9  3 - 12 %   Monocytes Absolute 0.3  0.1 - 1.0 K/uL   Eosinophils Relative 1  0 - 5 %   Eosinophils Absolute 0.0  0.0 - 0.7 K/uL   Basophils Relative 1  0 - 1 %   Basophils Absolute 0.0  0.0 - 0.1 K/uL  COMPREHENSIVE METABOLIC PANEL     Status: Abnormal   Collection Time    03/28/13  8:05 PM      Result Value Range   Sodium 140  135 - 145 mEq/L   Potassium 3.0 (*) 3.5 - 5.1 mEq/L   Chloride 99  96 - 112 mEq/L   CO2 29  19 - 32 mEq/L   Glucose, Bld 81  70 - 99 mg/dL   BUN 5 (*) 6 - 23 mg/dL   Creatinine, Ser 8.11  0.50 - 1.35 mg/dL   Calcium 9.3  8.4 - 91.4 mg/dL   Total Protein 7.7  6.0 - 8.3 g/dL   Albumin 3.8  3.5 - 5.2 g/dL   AST 41 (*) 0 - 37 U/L   ALT 25  0 - 53 U/L   Alkaline Phosphatase 116  39 - 117 U/L   Total Bilirubin 0.6  0.3 - 1.2 mg/dL   GFR calc non Af Amer >90  >90 mL/min   GFR calc Af Amer >90  >90 mL/min   Comment: (NOTE)     The eGFR has been calculated using the CKD EPI equation.     This calculation has not been validated in all clinical situations.     eGFR's persistently <90 mL/min signify possible Chronic Kidney     Disease.  ETHANOL     Status: Abnormal   Collection Time    03/28/13  8:05 PM      Result Value Range   Alcohol, Ethyl (B) 357 (*) 0 - 11 mg/dL   Comment:            LOWEST DETECTABLE LIMIT FOR     SERUM ALCOHOL IS 11 mg/dL     FOR MEDICAL PURPOSES ONLY  ETHANOL     Status: Abnormal   Collection Time    03/29/13  6:29 AM      Result Value Range   Alcohol, Ethyl (B) 145 (*) 0 - 11 mg/dL   Comment:            LOWEST DETECTABLE LIMIT FOR     SERUM ALCOHOL IS 11 mg/dL     FOR MEDICAL PURPOSES ONLY     Current Facility-Administered Medications  Medication Dose Route Frequency Provider Last Rate Last Dose  . acetaminophen (TYLENOL) tablet 650 mg  650 mg Oral Q4H PRN Dorthula Matas, PA-C      . benazepril (LOTENSIN) tablet 10 mg  10 mg Oral Daily Dorthula Matas, PA-C   10 mg at 03/29/13 1116  . citalopram (CELEXA) tablet 20 mg  20 mg Oral Daily Dorthula Matas, PA-C   20 mg at 03/29/13 1117  . ibuprofen (ADVIL,MOTRIN) tablet 600 mg  600 mg Oral Q8H PRN Dorthula Matas, PA-C      . LORazepam (ATIVAN) tablet 0-4 mg  0-4 mg Oral Q6H Tiffany Irine Seal, PA-C       Followed by  . [START ON 03/30/2013] LORazepam (ATIVAN) tablet 0-4 mg  0-4 mg Oral Q12H Dorthula Matas, PA-C   1 mg at 03/28/13 2147  . nicotine (NICODERM CQ - dosed in mg/24 hours) patch 21 mg  21 mg Transdermal Daily Dorthula Matas, PA-C   21 mg at 03/28/13 2146  . ondansetron (ZOFRAN) tablet 4 mg  4 mg Oral Q8H PRN Tiffany G  Neva Seat, PA-C      . thiamine (VITAMIN B-1) tablet 100 mg  100 mg Oral Daily Dorthula Matas, PA-C   100 mg at 03/29/13 1116   Or  . thiamine (B-1) injection 100 mg  100 mg Intravenous Daily Dorthula Matas, PA-C       Current Outpatient Prescriptions  Medication Sig Dispense Refill  . benazepril (LOTENSIN) 10 MG tablet Take 1 tablet (10 mg total) by mouth daily. For hypertension.  30 tablet  0  . citalopram (CELEXA) 20 MG tablet Take 1 tablet (20 mg total) by mouth daily. Take each day for anxiety and depression.  30 tablet  0  . traZODone (DESYREL) 50 MG tablet Take one tablet at bedtime if needed for insomnia.  30 tablet  0  . [DISCONTINUED] omeprazole (PRILOSEC) 20 MG capsule Take 20 mg by mouth daily.         Psychiatric Specialty Exam:     Blood pressure 94/57, pulse 58, temperature 98.6 F (37 C), temperature source Oral, resp. rate 18, SpO2 99.00%.There is no weight on file to calculate BMI.  General Appearance: Casual and Disheveled  Eye Contact::  Good  Speech:  Clear and Coherent and Normal Rate  Volume:  Normal  Mood:  I'm good  Affect:  Appropriate  Thought Process:  Circumstantial and Goal Directed  Orientation:  Full (Time, Place, and Person)  Thought Content:  Rumination  Suicidal Thoughts:  No  Homicidal Thoughts:  No  Memory:   Immediate;   Good Recent;   Good Remote;   Good  Judgement:  Fair  Insight:  Fair  Psychomotor Activity:  Normal  Concentration:  Good  Recall:  Good  Akathisia:  No  Handed:  Right  AIMS (if indicated):     Assets:  Communication Skills Desire for Improvement  Sleep:      Face to face interview and consult with Dr. Lolly Mustache  Treatment Plan Summary: Out patient services (rehab)  Disposition:  Discharge home.  CW/SW to give patient information on rehab services inpatient/outpatient  Assunta Found, FNP-BC 03/29/2013 12:33 PM  I have personally seen the patient and agreed with the findings and involved in the treatment plan. Kathryne Sharper, MD

## 2013-03-29 NOTE — BH Assessment (Signed)
Writer gave pt outpatient resources for alcohol treatment. Pt sts he wants to be d/c.  Evette Cristal, [place] Assessment Counselor   [facility]: [facility] ACCESS LINE:  [phone]  or [phone] [address] [place], [place] [address] [email or web address]   Treatment [facility] [address]  [place], [place] [address] [phone] (Tel) [phone] Engineer, production)   Mobile Crisis Teams [facility]    Assertive  Mobile Crisis Care Unit    [facility] [phone]     [address], [place], [place]        [phone]  [facility] [address] [facility] [address] [place] [phone]   Residential & Outpatient Substance Abuse Program [facility] [address] [place] [phone]  Intensive Outpatient Programs [facility]    [facility] [address]     [address] [place],  [place]     [place], [place] [phone]      [phone] Both a day and evening program   *Accepts MCD  [facility]  [facility] abuse treatment ctr [address]     [address] [place], [place] [address] [phone]      [phone]  [facility]: [facility] Drug Services    [facility] - Intensive Outpatient [address]     [address] [place], [place] [address]     [place], [place]  [phone] *Accepts MCD     [phone]  Residential Treatment Programs ASAP Residential Treatment    [facility] ([facility] [address]     [address] [place], [place] [phone]      [phone] or [phone]  [facility]     The [address] (Several in [place]) [address], [place] [address]    [address] [place] [address]     [place], [place] [phone]      [phone]  [facility] Residential Treatment  Facility   [facility] ([facility]) [address]     [address] [place], [place] [address]     [place], [place] [phone]      [phone] Admissions: 8am-3pm M-F  Self-Help/Support Groups [facility] [place] [facility]   Narcotics Anonymous (NA) Variety of support groups    [facility] [phone] (call for more info)    [address]        [place] - 2 meetings at this location

## 2013-03-29 NOTE — BH Assessment (Signed)
Tele Assessment Note   Kevin [name] is an 42 y.o. male.  -Clinician called Ellin Saba, PA at [facility] regarding seeing patient.  She reports that patient was not very forthcoming with information on medications and overall health condition.  He does want detox.  Patient came to [facility] seeking detox from ETOH.  He drinks six 40's per day.  Last drink was 10/14 around 18:00.  Patient also had a BAL of 357 at 20:00 on 10/14.  Patient uses $40 worth of cocaine per day on an on-going basis.  He last used on 10/14.  Patient endorses shakes, tremors, nausea as current condition.   Patient denies any SI, HI or A/V hallucinations.  He was in detox at [facility] in January and is interested in either there or [facility] for detox.  Wants to go to [facility] for residential rehabilitation. -Discussed patient care with Ellin Saba again.  Clinician asked her if a BAL could be done for 06:00 as patient's BAL would probably be below 160 by that time.  Clinician explained that [facility] and [facility] want to see the lab showing that BAL is below 160. Disposition: Patient to be referred to [facility] and [facility] after 07:00 and lab has resulted.  Axis I: 303.9 ETOH dependence Axis II: Deferred Axis III:  Past Medical History  Diagnosis Date  . Alcohol abuse   . Anxiety   . Depression   . Acid reflux   . Hypertension    Axis IV: economic problems, housing problems, occupational problems, other psychosocial or environmental problems, problems with access to health care services and problems with primary support group Axis V: 31-40 impairment in reality testing  Past Medical History:  Past Medical History  Diagnosis Date  . Alcohol abuse   . Anxiety   . Depression   . Acid reflux   . Hypertension     Past Surgical History  Procedure Laterality Date  . Alcohol abuse    . Left leg surgery    . Facial reconstruction surgery      d/t facial fx's    Family History: History reviewed. No pertinent family history.  Social  History:  reports that he has been smoking Cigarettes.  He has been smoking about 0.25 packs per day. He does not have any smokeless tobacco history on file. He reports that he drinks alcohol. He reports that he uses illicit drugs (Marijuana and Cocaine).  Additional Social History:  Alcohol / Drug Use Pain Medications: Pt denies Prescriptions: Pt reports he has some "depression medication" but does not take, rather dirnks instead. Over the Counter: N/A History of alcohol / drug use?: Yes Withdrawal Symptoms: Cramps;Diarrhea;Fever / Chills;Irritability;Nausea / Vomiting;Patient aware of relationship between substance abuse and physical/medical complications;Sweats;Tingling;Tremors;Weakness Substance #1 Name of Substance 1: ETOH, usually beer & wine 1 - Age of First Use: 42 years of age 21 - Amount (size/oz): Currently drinking six 40's per day 1 - Frequency: Daily use 1 - Duration: On-going 1 - Last Use / Amount: 1014 prior to arrival, around 18:00 Substance #2 Name of Substance 2: Cocaine 2 - Age of First Use: 42 years old (probably more history than this) 2 - Amount (size/oz): $40 worth per day 2 - Frequency: Daily use 2 - Duration: On-going 2 - Last Use / Amount: 10/14  CIWA: CIWA-Ar BP: 163/90 mmHg Pulse Rate: 74 Nausea and Vomiting: mild nausea with no vomiting Tactile Disturbances: none Tremor: two Auditory Disturbances: not present Paroxysmal Sweats: barely perceptible sweating, palms moist Visual Disturbances:  not present Anxiety: mildly anxious Headache, Fullness in Head: very mild Agitation: normal activity Orientation and Clouding of Sensorium: cannot do serial additions or is uncertain about date CIWA-Ar Total: 7 COWS:    Allergies: No Known Allergies  Home Medications:  (Not in a hospital admission)  OB/GYN Status:  No LMP for male patient.  General Assessment Data [facility] of Assessment: [facility] Is this a Tele or Face-to-Face Assessment?: Tele Assessment Is  this an Initial Assessment or a Re-assessment for this encounter?: Initial Assessment Living Arrangements: Other (Comment) (Pt homeless) Can pt return to current living arrangement?: Yes Admission Status: Voluntary Is patient capable of signing voluntary admission?: Yes Transfer from: Acute Hospital Referral Source: Self/Family/Friend     [facility] Crisis Care Plan Living Arrangements: Other (Comment) (Pt homeless) Name of Psychiatrist: None Name of Therapist: None     Risk to self Suicidal Ideation: No Suicidal Intent: No Is patient at risk for suicide?: No Suicidal Plan?: No Access to Means: No What has been your use of drugs/alcohol within the last 12 months?: ETOH & Cocaine daily use Previous Attempts/Gestures: No How many times?: 0 Other Self Harm Risks: SA issues Triggers for Past Attempts: None known Intentional Self Injurious Behavior: None Family Suicide History: No Recent stressful life event(s):  (None) Persecutory voices/beliefs?: No Depression: Yes Depression Symptoms: Despondent;Feeling worthless/self pity;Fatigue Substance abuse history and/or treatment for substance abuse?: Yes Suicide prevention information given to non-admitted patients: Not applicable  Risk to Others Homicidal Ideation: No Thoughts of Harm to Others: No Current Homicidal Intent: No Current Homicidal Plan: No Access to Homicidal Means: No Identified Victim: No one History of harm to others?: No Assessment of Violence: None Noted Violent Behavior Description: N/A Does patient have access to weapons?: No Criminal Charges Pending?: No Does patient have a court date: No  Psychosis Hallucinations: None noted Delusions: None noted  Mental Status Report Appear/Hygiene: Body odor;Disheveled;Poor hygiene Eye Contact: Poor Motor Activity: Freedom of movement;Unremarkable Speech: Logical/coherent Level of Consciousness: Sleeping Mood: Depressed Affect: Sad Anxiety Level: None Thought  Processes: Coherent;Relevant Judgement: Impaired Orientation: Person;Place;Time;Situation Obsessive Compulsive Thoughts/Behaviors: None  Cognitive Functioning Concentration: Decreased Memory: Recent Impaired;Remote Intact IQ: Average Insight: Poor Impulse Control: Poor Appetite: Fair Weight Loss: 0 Weight Gain: 0 Sleep: Decreased Total Hours of Sleep: 6 Vegetative Symptoms: None  ADLScreening [facility]) Patient's cognitive ability adequate to safely complete daily activities?: Yes Patient able to express need for assistance with ADLs?: Yes Independently performs ADLs?: Yes (appropriate for developmental age)  Prior Inpatient Therapy Prior Inpatient Therapy: Yes Prior Therapy Dates: January '14 Prior Therapy Facilty/Provider(s): [facility] Reason for Treatment: Detox  Prior Outpatient Therapy Prior Outpatient Therapy: No Prior Therapy Dates: N/a Prior Therapy Facilty/Provider(s): Pt did not know Reason for Treatment: Did not know  ADL Screening (condition at time of admission) Patient's cognitive ability adequate to safely complete daily activities?: Yes Is the patient deaf or have difficulty hearing?: No Does the patient have difficulty seeing, even when wearing glasses/contacts?: No Does the patient have difficulty concentrating, remembering, or making decisions?: No Patient able to express need for assistance with ADLs?: Yes Does the patient have difficulty dressing or bathing?: No Independently performs ADLs?: Yes (appropriate for developmental age) Does the patient have difficulty walking or climbing stairs?: No Weakness of Legs: None Weakness of Arms/Hands: None       Abuse/Neglect Assessment (Assessment to be complete while patient is alone) Physical Abuse: Denies Verbal Abuse: Denies Sexual Abuse: Denies Exploitation of patient/patient's resources: Denies Self-Neglect: Denies  Values / Beliefs Cultural Requests During Hospitalization:  None Spiritual Requests During Hospitalization: None   Advance Directives (For Healthcare) Advance Directive: Patient does not have advance directive;Patient would not like information    Additional Information 1:1 In Past 12 Months?: No CIRT Risk: No Elopement Risk: No Does patient have medical clearance?: Yes     Disposition:  Disposition Initial Assessment Completed for this Encounter: Yes Disposition of Patient: Inpatient treatment program;Referred to Type of inpatient treatment program: Adult Patient referred to: [facility] (Pt needs to be run after BAL is <160 (after 06:00 on 10/15))  Beatriz Stallion Ray 03/29/2013 2:20 AM

## 2013-04-04 NOTE — ED Provider Notes (Signed)
Medical screening examination/treatment/procedure(s) were performed by non-physician practitioner and as supervising physician I was immediately available for consultation/collaboration.  Kijuana Ruppel, MD 04/04/13 1439 

## 2013-05-29 ENCOUNTER — Encounter (HOSPITAL_COMMUNITY): Payer: Self-pay | Admitting: Emergency Medicine

## 2013-05-29 ENCOUNTER — Emergency Department (HOSPITAL_COMMUNITY): Payer: Self-pay

## 2013-05-29 ENCOUNTER — Emergency Department (HOSPITAL_COMMUNITY)
Admission: EM | Admit: 2013-05-29 | Discharge: 2013-05-29 | Disposition: A | Discharge status: Home / Self Care | Payer: Self-pay | Attending: Emergency Medicine | Admitting: Emergency Medicine

## 2013-05-29 DIAGNOSIS — Y939 Activity, unspecified: Secondary | ICD-10-CM | POA: Insufficient documentation

## 2013-05-29 DIAGNOSIS — Y929 Unspecified place or not applicable: Secondary | ICD-10-CM | POA: Insufficient documentation

## 2013-05-29 DIAGNOSIS — F3289 Other specified depressive episodes: Secondary | ICD-10-CM | POA: Insufficient documentation

## 2013-05-29 DIAGNOSIS — F411 Generalized anxiety disorder: Secondary | ICD-10-CM | POA: Insufficient documentation

## 2013-05-29 DIAGNOSIS — S82843A Displaced bimalleolar fracture of unspecified lower leg, initial encounter for closed fracture: Secondary | ICD-10-CM | POA: Insufficient documentation

## 2013-05-29 DIAGNOSIS — F329 Major depressive disorder, single episode, unspecified: Secondary | ICD-10-CM | POA: Insufficient documentation

## 2013-05-29 DIAGNOSIS — S82892A Other fracture of left lower leg, initial encounter for closed fracture: Secondary | ICD-10-CM

## 2013-05-29 DIAGNOSIS — F17200 Nicotine dependence, unspecified, uncomplicated: Secondary | ICD-10-CM | POA: Insufficient documentation

## 2013-05-29 DIAGNOSIS — K219 Gastro-esophageal reflux disease without esophagitis: Secondary | ICD-10-CM | POA: Insufficient documentation

## 2013-05-29 DIAGNOSIS — X58XXXA Exposure to other specified factors, initial encounter: Secondary | ICD-10-CM | POA: Insufficient documentation

## 2013-05-29 DIAGNOSIS — I10 Essential (primary) hypertension: Secondary | ICD-10-CM | POA: Insufficient documentation

## 2013-05-29 DIAGNOSIS — F1010 Alcohol abuse, uncomplicated: Secondary | ICD-10-CM | POA: Insufficient documentation

## 2013-05-29 NOTE — ED Provider Notes (Signed)
CSN: [id number]     Arrival date & time 05/29/13  1251 History   First MD Initiated Contact with Patient 05/29/13 1345    This chart was scribed for Roxy Horseman PA-C, a non-physician practitioner working with Laray Anger, DO by Lewanda Rife, ED Scribe. This patient was seen in room TR06C/TR06C and the patient's care was started at 2:34 PM     Chief Complaint  Patient presents with  . Leg Pain   (Consider location/radiation/quality/duration/timing/severity/associated sxs/prior Treatment) The history is provided by the patient and medical records. No language interpreter was used.   HPI Comments: Kevin [name] is a 42 y.o. male who presents to the Emergency Department complaining of constant moderate left ankle pain onset 3 weeks while in jail. Reports pain is exacerbated by touch and alleviated by nothing. Reports he was previously evaluated for ankle injury by orthopedist Dr. Turner Daniels while in jail, but no recent follow up.   Past Medical History  Diagnosis Date  . Alcohol abuse   . Anxiety   . Depression   . Acid reflux   . Hypertension    Past Surgical History  Procedure Laterality Date  . Alcohol abuse    . Left leg surgery    . Facial reconstruction surgery      d/t facial fx's   No family history on file. History  Substance Use Topics  . Smoking status: Current Every Day Smoker -- 0.25 packs/day    Types: Cigarettes  . Smokeless tobacco: Not on file  . Alcohol Use: 0.0 oz/week    2-3 Glasses of wine per week     Comment: drink wine constantly all day    Review of Systems  Constitutional: Negative for fever.  Musculoskeletal: Positive for arthralgias.  Neurological: Negative for numbness.  Psychiatric/Behavioral: Negative for confusion.  All other systems reviewed and are negative.   A complete 10 system review of systems was obtained and all systems are negative except as noted in the HPI and PMHx.    Allergies  Review of patient's  allergies indicates no known allergies.  Home Medications   Current Outpatient Rx  Name  Route  Sig  Dispense  Refill  . ibuprofen (ADVIL,MOTRIN) 200 MG tablet   Oral   Take 800 mg by mouth every 6 (six) hours as needed for moderate pain.          BP 158/111  Pulse 80  Temp(Src) 98.1 F (36.7 C) (Oral)  Resp 18  Wt 159 lb 1.6 oz (72.167 kg)  SpO2 100% Physical Exam  Nursing note and vitals reviewed. Constitutional: He is oriented to person, place, and time. He appears well-developed and well-nourished. No distress.  HENT:  Head: Normocephalic and atraumatic.  Eyes: Conjunctivae and EOM are normal.  Neck: Neck supple. No tracheal deviation present.  Cardiovascular: Normal rate, regular rhythm, intact distal pulses and normal pulses.   Pulses:      Dorsalis pedis pulses are 2+ on the left side.       Posterior tibial pulses are 2+ on the left side.  Pulmonary/Chest: Effort normal. No respiratory distress.  Musculoskeletal: Normal range of motion.  Ankle Tenderness entire joint, Distal fibula non-tender, Medial malleolus TTP, Achilles non-tender, Proximal fibula non-tender, Proximal 5th metatarsal non-tender, Midfoot non-tender, distally Neurovascularly Intact with baseline sensation / motor to foot with Cap refill<2 seconds.   Neurological: He is alert and oriented to person, place, and time.  Skin: Skin is warm and dry.  Psychiatric: He has  a normal mood and affect. His behavior is normal.    ED Course  Procedures (including critical care time)  COORDINATION OF CARE:  Nursing notes reviewed. Vital signs reviewed. Initial pt interview and examination performed.   2:07 PM-Discussed work up plan with pt at bedside, which includes x-ray of left ankle. Pt agrees with plan.   Treatment plan initiated:Medications - No data to display   Initial diagnostic testing ordered.    Results for orders placed during the hospital encounter of 03/28/13  CBC WITH DIFFERENTIAL       Result Value Range   WBC 3.5 (*) 4.0 - 10.5 K/uL   RBC 4.71  4.22 - 5.81 MIL/uL   Hemoglobin 12.7 (*) 13.0 - 17.0 g/dL   HCT 16.1 (*) 09.6 - 04.5 %   MCV 79.6  78.0 - 100.0 fL   MCH 27.0  26.0 - 34.0 pg   MCHC 33.9  30.0 - 36.0 g/dL   RDW 40.9 (*) 81.1 - 91.4 %   Platelets 358  150 - 400 K/uL   Neutrophils Relative % 45  43 - 77 %   Neutro Abs 1.6 (*) 1.7 - 7.7 K/uL   Lymphocytes Relative 45  12 - 46 %   Lymphs Abs 1.6  0.7 - 4.0 K/uL   Monocytes Relative 9  3 - 12 %   Monocytes Absolute 0.3  0.1 - 1.0 K/uL   Eosinophils Relative 1  0 - 5 %   Eosinophils Absolute 0.0  0.0 - 0.7 K/uL   Basophils Relative 1  0 - 1 %   Basophils Absolute 0.0  0.0 - 0.1 K/uL  COMPREHENSIVE METABOLIC PANEL      Result Value Range   Sodium 140  135 - 145 mEq/L   Potassium 3.0 (*) 3.5 - 5.1 mEq/L   Chloride 99  96 - 112 mEq/L   CO2 29  19 - 32 mEq/L   Glucose, Bld 81  70 - 99 mg/dL   BUN 5 (*) 6 - 23 mg/dL   Creatinine, Ser 7.82  0.50 - 1.35 mg/dL   Calcium 9.3  8.4 - 95.6 mg/dL   Total Protein 7.7  6.0 - 8.3 g/dL   Albumin 3.8  3.5 - 5.2 g/dL   AST 41 (*) 0 - 37 U/L   ALT 25  0 - 53 U/L   Alkaline Phosphatase 116  39 - 117 U/L   Total Bilirubin 0.6  0.3 - 1.2 mg/dL   GFR calc non Af Amer >90  >90 mL/min   GFR calc Af Amer >90  >90 mL/min  URINE RAPID DRUG SCREEN (HOSP PERFORMED)      Result Value Range   Opiates NONE DETECTED  NONE DETECTED   Cocaine POSITIVE (*) NONE DETECTED   Benzodiazepines NONE DETECTED  NONE DETECTED   Amphetamines NONE DETECTED  NONE DETECTED   Tetrahydrocannabinol NONE DETECTED  NONE DETECTED   Barbiturates NONE DETECTED  NONE DETECTED  ETHANOL      Result Value Range   Alcohol, Ethyl (B) 357 (*) 0 - 11 mg/dL  ETHANOL      Result Value Range   Alcohol, Ethyl (B) 145 (*) 0 - 11 mg/dL   Dg Ankle Complete Left  05/29/2013   CLINICAL DATA:  Pain post trauma  EXAM: LEFT ANKLE COMPLETE - 3+ VIEW  COMPARISON:  None.  FINDINGS: Frontal, oblique, and lateral views were  obtained. There is an obliquely oriented fracture of the medial malleolus. There is  a questionable nearby transverse fracture of the medial malleolus as well. There is a subtle transverse lucency in the lateral malleolus also concerning for fracture.  No effusion.  Ankle mortise appears intact.  No erosive change.  IMPRESSION: Fractures of the medial and lateral malleoli in overall near anatomic alignment. Ankle mortise appears intact.   Electronically Signed   By: Bretta Bang M.D.   On: 05/29/2013 15:26     EKG Interpretation   None       MDM   1. Ankle fracture, left, closed, initial encounter    Patient with left ankle fracture. Recommend orthopedic followup. 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 Will let him continue with this, but urged nonweightbearing, and will give crutches. Patient understands and agrees with the plan. He is stable and ready for discharge.  I personally performed the services described in this documentation, which was scribed in my presence. The recorded information has been reviewed and is accurate.     Roxy Horseman, PA-C 05/29/13 [phone]
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
## 2013-05-29 NOTE — ED Notes (Signed)
Pt requesting pain medication at discharge, PA aware- Ib profen prescription offered to pt- pt refused.

## 2013-05-30 NOTE — ED Provider Notes (Signed)
Medical screening examination/treatment/procedure(s) were performed by non-physician practitioner and as supervising physician I was immediately available for consultation/collaboration.  EKG Interpretation   None         Mahmud Keithly M Tyrianna Lightle, DO 05/30/13 0550 

## 2016-04-23 ENCOUNTER — Emergency Department (HOSPITAL_COMMUNITY): Payer: Self-pay

## 2016-04-23 ENCOUNTER — Emergency Department (HOSPITAL_COMMUNITY)
Admission: EM | Admit: 2016-04-23 | Discharge: 2016-04-23 | Disposition: A | Discharge status: Home / Self Care | Payer: Self-pay | Attending: Emergency Medicine | Admitting: Emergency Medicine

## 2016-04-23 ENCOUNTER — Encounter (HOSPITAL_COMMUNITY): Payer: Self-pay | Admitting: Emergency Medicine

## 2016-04-23 DIAGNOSIS — I10 Essential (primary) hypertension: Secondary | ICD-10-CM | POA: Insufficient documentation

## 2016-04-23 DIAGNOSIS — S63283A Dislocation of proximal interphalangeal joint of left middle finger, initial encounter: Secondary | ICD-10-CM | POA: Insufficient documentation

## 2016-04-23 DIAGNOSIS — Y929 Unspecified place or not applicable: Secondary | ICD-10-CM | POA: Insufficient documentation

## 2016-04-23 DIAGNOSIS — Y999 Unspecified external cause status: Secondary | ICD-10-CM | POA: Insufficient documentation

## 2016-04-23 DIAGNOSIS — Y939 Activity, unspecified: Secondary | ICD-10-CM | POA: Insufficient documentation

## 2016-04-23 DIAGNOSIS — S63259A Unspecified dislocation of unspecified finger, initial encounter: Secondary | ICD-10-CM

## 2016-04-23 DIAGNOSIS — F17210 Nicotine dependence, cigarettes, uncomplicated: Secondary | ICD-10-CM | POA: Insufficient documentation

## 2016-04-23 MED ORDER — IBUPROFEN 800 MG PO TABS
800.0000 mg | ORAL_TABLET | Freq: Once | ORAL | Status: AC
  Administered 2016-04-23: 800 mg via ORAL
  Filled 2016-04-23: qty 1

## 2016-04-23 MED ORDER — IBUPROFEN 800 MG PO TABS: 800.0000 mg | ORAL_TABLET | Freq: Three times a day (TID) | ORAL | 0 refills | Status: DC

## 2016-04-23 MED ORDER — ACETAMINOPHEN 325 MG PO TABS
650.0000 mg | ORAL_TABLET | Freq: Once | ORAL | Status: AC
  Administered 2016-04-23: 650 mg via ORAL
  Filled 2016-04-23: qty 2

## 2016-04-23 MED ORDER — LIDOCAINE HCL 2 % IJ SOLN
10.0000 mL | Freq: Once | INTRAMUSCULAR | Status: AC
  Administered 2016-04-23: 200 mg via INTRADERMAL
  Filled 2016-04-23: qty 20
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
## 2016-04-23 NOTE — ED Triage Notes (Signed)
Pt refused CBG-PA at bedside

## 2016-04-23 NOTE — Discharge Instructions (Signed)
There may be a tendon torn in the injured finger preventing it from staying straight. This will require evaluation and treatment by the hand specialist. This will not resolve on its own. Call the number provided to set up an appointment.  Ibuprofen or naproxen for pain management.
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
## 2016-04-23 NOTE — ED Notes (Signed)
Pt declined discharge vitals

## 2017-06-25 ENCOUNTER — Other Ambulatory Visit: Payer: Self-pay

## 2017-06-25 ENCOUNTER — Emergency Department (HOSPITAL_COMMUNITY)
Admission: EM | Admit: 2017-06-25 | Discharge: 2017-06-25 | Disposition: A | Discharge status: Home / Self Care | Payer: Self-pay | Attending: Emergency Medicine | Admitting: Emergency Medicine

## 2017-06-25 ENCOUNTER — Encounter (HOSPITAL_COMMUNITY): Payer: Self-pay

## 2017-06-25 DIAGNOSIS — F17210 Nicotine dependence, cigarettes, uncomplicated: Secondary | ICD-10-CM | POA: Insufficient documentation

## 2017-06-25 DIAGNOSIS — Y908 Blood alcohol level of 240 mg/100 ml or more: Secondary | ICD-10-CM | POA: Insufficient documentation

## 2017-06-25 DIAGNOSIS — I10 Essential (primary) hypertension: Secondary | ICD-10-CM | POA: Insufficient documentation

## 2017-06-25 DIAGNOSIS — F10920 Alcohol use, unspecified with intoxication, uncomplicated: Secondary | ICD-10-CM | POA: Insufficient documentation

## 2017-06-25 LAB — CBC WITH DIFFERENTIAL/PLATELET
Basophils Absolute: 0 10*3/uL (ref 0.0–0.1)
Basophils Relative: 1 %
Eosinophils Absolute: 0 10*3/uL (ref 0.0–0.7)
Eosinophils Relative: 1 %
HCT: 37.1 % — ABNORMAL LOW (ref 39.0–52.0)
Hemoglobin: 12.5 g/dL — ABNORMAL LOW (ref 13.0–17.0)
LYMPHS ABS: 0.8 10*3/uL (ref 0.7–4.0)
LYMPHS PCT: 22 %
MCH: 29.6 pg (ref 26.0–34.0)
MCHC: 33.7 g/dL (ref 30.0–36.0)
MCV: 87.7 fL (ref 78.0–100.0)
Monocytes Absolute: 0.5 10*3/uL (ref 0.1–1.0)
Monocytes Relative: 12 %
Neutro Abs: 2.5 10*3/uL (ref 1.7–7.7)
Neutrophils Relative %: 64 %
Platelets: 362 10*3/uL (ref 150–400)
RBC: 4.23 MIL/uL (ref 4.22–5.81)
RDW: 13.6 % (ref 11.5–15.5)
WBC: 3.8 10*3/uL — ABNORMAL LOW (ref 4.0–10.5)

## 2017-06-25 LAB — COMPREHENSIVE METABOLIC PANEL
ALT: 15 U/L — AB (ref 17–63)
AST: 31 U/L (ref 15–41)
Albumin: 3.8 g/dL (ref 3.5–5.0)
Alkaline Phosphatase: 98 U/L (ref 38–126)
Anion gap: 13 (ref 5–15)
BUN: 9 mg/dL (ref 6–20)
CO2: 21 mmol/L — AB (ref 22–32)
CREATININE: 0.7 mg/dL (ref 0.61–1.24)
Calcium: 8.2 mg/dL — ABNORMAL LOW (ref 8.9–10.3)
Chloride: 99 mmol/L — ABNORMAL LOW (ref 101–111)
GFR calc Af Amer: >60 mL/min (ref >60)
Glucose, Bld: 121 mg/dL — ABNORMAL HIGH (ref 65–99)
Potassium: 3.7 mmol/L (ref 3.5–5.1)
Sodium: 133 mmol/L — ABNORMAL LOW (ref 135–145)
Total Bilirubin: 0.5 mg/dL (ref 0.3–1.2)
Total Protein: 7.3 g/dL (ref 6.5–8.1)

## 2017-06-25 LAB — ETHANOL: Alcohol, Ethyl (B): 365 mg/dL (ref <10)

## 2017-06-25 LAB — RAPID URINE DRUG SCREEN, HOSP PERFORMED
AMPHETAMINES: NOT DETECTED
Barbiturates: NOT DETECTED
Benzodiazepines: NOT DETECTED
Cocaine: NOT DETECTED
Opiates: NOT DETECTED
Tetrahydrocannabinol: NOT DETECTED

## 2017-06-25 LAB — CBG MONITORING, ED: GLUCOSE-CAPILLARY: 103 mg/dL — AB (ref 65–99)

## 2017-06-25 MED ORDER — THIAMINE HCL 100 MG/ML IJ SOLN
100.0000 mg | Freq: Once | INTRAMUSCULAR | Status: AC
  Administered 2017-06-25: 100 mg via INTRAVENOUS
  Filled 2017-06-25: qty 2

## 2017-06-25 MED ORDER — SODIUM CHLORIDE 0.9 % IV BOLUS (SEPSIS)
1000.0000 mL | Freq: Once | INTRAVENOUS | Status: AC
  Administered 2017-06-25: 1000 mL via INTRAVENOUS

## 2017-06-25 MED ORDER — ONDANSETRON HCL 4 MG/2ML IJ SOLN
4.0000 mg | Freq: Once | INTRAMUSCULAR | Status: AC
  Administered 2017-06-25: 4 mg via INTRAVENOUS
  Filled 2017-06-25: qty 2

## 2017-06-25 NOTE — ED Notes (Signed)
Patient coughed up bloody mucous in urinal-unable to send for testing-patient given new urinal and emesis bag. Patient is not compliant with any instruction

## 2017-06-25 NOTE — ED Provider Notes (Signed)
TIME SEEN: 3:05 AM  CHIEF COMPLAINT: Intoxication  HPI: Patient is a 47 year old male with history of alcohol abuse, depression and hypertension who presents to the emergency department with police from jail for acute intoxication.  Patient unable to provide any history.  He is actively vomiting and then quickly falls asleep.  No signs of trauma.  ROS: Level 5 caveat for intoxication  PAST MEDICAL HISTORY/PAST SURGICAL HISTORY:  Past Medical History:  Diagnosis Date  . Acid reflux   . Alcohol abuse   . Anxiety   . Depression   . Hypertension     MEDICATIONS:  Prior to Admission medications   Medication Sig Start Date End Date Taking? Authorizing Provider  ibuprofen (ADVIL,MOTRIN) 200 MG tablet Take 800 mg by mouth every 6 (six) hours as needed for moderate pain.    [provider]  ibuprofen (ADVIL,MOTRIN) 800 MG tablet Take 1 tablet (800 mg total) by mouth 3 (three) times daily. 04/23/16   Joy, Shawn C, PA-C    ALLERGIES:  No Known Allergies  SOCIAL HISTORY:  Social History   Tobacco Use  . Smoking status: Current Every Day Smoker    Packs/day: 0.25    Types: Cigarettes  . Smokeless tobacco: Never Used  Substance Use Topics  . Alcohol use: Yes    Alcohol/week: 0.0 oz    Types: 2 - 3 Glasses of wine per week    Comment: drink wine constantly all day    FAMILY HISTORY: No family history on file.  EXAM: BP (!) 153/111 (BP Location: Right Arm)   Pulse 69   Resp 18   SpO2 97%  CONSTITUTIONAL: Alert and will state his name but quickly falls asleep, actively vomiting previous to this, smells strongly of alcohol HEAD: Normocephalic, atraumatic EYES: Conjunctivae clear, pupils appear equal, EOMI ENT: normal nose; moist mucous membranes NECK: Supple, no meningismus, no nuchal rigidity, no LAD, no midline spinal tenderness or step-off or deformity CARD: RRR; S1 and S2 appreciated; no murmurs, no clicks, no rubs, no gallops RESP: Normal chest excursion without  splinting or tachypnea; breath sounds clear and equal bilaterally; no wheezes, no rhonchi, no rales, no hypoxia or respiratory distress, speaking full sentences ABD/GI: Normal bowel sounds; non-distended; soft, non-tender, no rebound, no guarding, no peritoneal signs, no hepatosplenomegaly BACK:  The back appears normal and is non-tender to palpation, there is no CVA tenderness, no midline spinal tenderness or step-off or deformity EXT: Normal ROM in all joints; non-tender to palpation; no edema; normal capillary refill; no cyanosis, no calf tenderness or swelling    SKIN: Normal color for age and race; warm; no rash NEURO: Moves all extremities equally PSYCH: Patient appears disheveled.  Poor grooming.  MEDICAL DECISION MAKING: Patient here from jail for intoxication.  Currently cannot tell me his name but does not answer questions otherwise.  No sign of trauma on exam.  Will check labs, CBG.  Will give IV fluids and Zofran.  He will be monitored until clinically sober.  ED PROGRESS: Patient continues to be resting comfortably.  No further active vomiting.  Labs show alcohol level of 365.  Otherwise labs appear to be at his baseline.  Will monitor sober.  He is under police custody.   7:15 AM  Pt still very intoxicated and will need to be monitored until clinically sober he is able to ambulate, eat and drink.  Nursing staff has updated police.  Signed out to oncoming ED physician Dr. Eudelia Bunch.   I reviewed all nursing  notes, vitals, pertinent previous records, EKGs, lab and urine results, imaging (as available).     Rahm Minix, Layla MawKristen N, DO 06/25/17 [phone]

## 2017-06-25 NOTE — ED Notes (Signed)
Pt ambulated in the hall to restroom

## 2017-06-25 NOTE — ED Triage Notes (Signed)
[facility]patient is in custody. Patient is intoxicated and the jail nurse would not take the patient. BP 146/90 CBG 187 O2 sat 99%.

## 2017-06-25 NOTE — ED Provider Notes (Signed)
I assumed care of this patient from Dr. Elesa MassedWard at 0730.  Please see their note for further details of Hx, PE.  Briefly patient is a 47 y.o. male who presented with intoxication.   Current plan is to metabolize to freedom and reassess.  [employer] is here awaiting patient's discharge.  8:20 AM  Reassessed the patient and he was able to ambulate to the bathroom without complication.  States he feels fine just "having a hangover."  He is safe to be discharged to [employer]'s custody.  Disposition: Discharge  Condition: Good  I have discussed the results, Dx and Tx plan with the patient who expressed understanding and agree(s) with the plan. Discharge instructions discussed at great length. The patient was given strict return precautions who verbalized understanding of the instructions. No further questions at time of discharge.    ED Discharge Orders    None          Cardama, Amadeo GarnetPedro Eduardo, MD 06/25/17 [phone]

## 2017-06-25 NOTE — ED Notes (Signed)
Unable to collect labs I attempted to collect labs patient moved his arm and needle came out

## 2017-09-03 ENCOUNTER — Encounter (HOSPITAL_COMMUNITY): Payer: Self-pay | Admitting: Emergency Medicine

## 2017-09-03 DIAGNOSIS — F17210 Nicotine dependence, cigarettes, uncomplicated: Secondary | ICD-10-CM | POA: Insufficient documentation

## 2017-09-03 DIAGNOSIS — M25551 Pain in right hip: Secondary | ICD-10-CM | POA: Insufficient documentation

## 2017-09-03 DIAGNOSIS — I10 Essential (primary) hypertension: Secondary | ICD-10-CM | POA: Insufficient documentation

## 2017-09-03 DIAGNOSIS — Z79899 Other long term (current) drug therapy: Secondary | ICD-10-CM | POA: Insufficient documentation

## 2017-09-03 NOTE — ED Triage Notes (Signed)
Pt reports bilateral hip pain X several years. Pt is ambulatory. Appears to be intoxicated, cursing and yelling at staff.

## 2017-09-04 ENCOUNTER — Emergency Department (HOSPITAL_COMMUNITY): Payer: Self-pay

## 2017-09-04 ENCOUNTER — Emergency Department (HOSPITAL_COMMUNITY)
Admission: EM | Admit: 2017-09-04 | Discharge: 2017-09-04 | Disposition: A | Discharge status: Home / Self Care | Payer: Self-pay | Attending: Emergency Medicine | Admitting: Emergency Medicine

## 2017-09-04 DIAGNOSIS — M25551 Pain in right hip: Secondary | ICD-10-CM

## 2017-09-04 MED ORDER — KETOROLAC TROMETHAMINE 60 MG/2ML IM SOLN
60.0000 mg | Freq: Once | INTRAMUSCULAR | Status: AC
  Administered 2017-09-04: 60 mg via INTRAMUSCULAR
  Filled 2017-09-04: qty 2

## 2017-09-04 MED ORDER — MELOXICAM 15 MG PO TABS: 15.0000 mg | ORAL_TABLET | Freq: Every day | ORAL | 0 refills | Status: DC

## 2017-09-04 NOTE — ED Provider Notes (Signed)
[facility] EMERGENCY DEPARTMENT Provider Note   CSN: [id number] Arrival date & time: 09/03/17  2343     History   Chief Complaint Chief Complaint  Patient presents with  . Hip Pain    HPI Kevin [name] is a 47 y.o. male.  Patient is here for evaluation of chronic hip pain that has been progressive for "years'. No new injury. He reports the greatest pain when he gets up in the morning. No swelling, back pain, numbness, weakness. There is pain in the left hip but the right is what he feels is the greatest problem.  He has taken Ibuprofen without relief. No back pain. No numbness or weakness distally.  The history is provided by the patient. No language interpreter was used.  Hip Pain     Past Medical History:  Diagnosis Date  . Acid reflux   . Alcohol abuse   . Anxiety   . Depression   . Hypertension     Patient Active Problem List   Diagnosis Date Noted  . Alcohol dependence (HCC) 02/19/2013  . Major depression, recurrent (HCC) 02/19/2013    Past Surgical History:  Procedure Laterality Date  . alcohol abuse    . FACIAL RECONSTRUCTION SURGERY     d/t facial fx's  . left leg surgery          Home Medications    Prior to Admission medications   Medication Sig Start Date End Date Taking? Authorizing Provider  ibuprofen (ADVIL,MOTRIN) 200 MG tablet Take 800 mg by mouth every 6 (six) hours as needed for moderate pain.    [provider]  ibuprofen (ADVIL,MOTRIN) 800 MG tablet Take 1 tablet (800 mg total) by mouth 3 (three) times daily. 04/23/16   Joy, Shawn C, PA-C  omeprazole (PRILOSEC) 20 MG capsule Take 20 mg by mouth daily.   09/06/11  [provider]    Family History No family history on file.  Social History Social History   Tobacco Use  . Smoking status: Current Every Day Smoker    Packs/day: 0.25    Types: Cigarettes  . Smokeless tobacco: Never Used  Substance Use Topics  . Alcohol use: Yes   Alcohol/week: 0.0 oz    Types: 2 - 3 Glasses of wine per week    Comment: drink wine constantly all day  . Drug use: Yes    Types: Marijuana, Cocaine     Allergies   Patient has no known allergies.   Review of Systems Review of Systems  Constitutional: Negative for fever.  Musculoskeletal:       See HPI  Skin: Negative.   Neurological: Negative.  Negative for weakness and numbness.     Physical Exam Updated Vital Signs BP (!) 166/113 (BP Location: Right Arm)   Pulse 90   Temp 98 F (36.7 C) (Oral)   Resp 20   SpO2 100%   Physical Exam  Constitutional: He is oriented to person, place, and time. He appears well-developed and well-nourished.  Neck: Normal range of motion.  Pulmonary/Chest: Effort normal.  Abdominal: There is no tenderness.  Musculoskeletal: Normal range of motion.  FROM of hips bilaterally with c/o pain with range on right. No swelling. Distal pulses present.   Neurological: He is alert and oriented to person, place, and time.  Skin: Skin is warm and dry.  Psychiatric: He has a normal mood and affect.     ED Treatments / Results  Labs (all labs ordered are listed,  but only abnormal results are displayed) Labs Reviewed - No data to display  EKG None  Radiology No results found.  Procedures Procedures (including critical care time)  Medications Ordered in ED Medications  ketorolac (TORADOL) injection 60 mg (60 mg Intramuscular Given 09/04/17 0201)     Initial Impression / Assessment and Plan / ED Course  I have reviewed the triage vital signs and the nursing notes.  Pertinent labs & imaging results that were available during my care of the patient were reviewed by me and considered in my medical decision making (see chart for details).     Patient asks for pain management. He has been taking ibuprofen without relief, "the whole bottle". Presentation suggests arthritic joint pain. IM Toradol offered. Will image to insure no other  abnormality requiring attention.   Patient's x-ray is negative for acute finding. Plan to discharge with Mobic and PCP follow up.  Final Clinical Impressions(s) / ED Diagnoses   Final diagnoses:  None   1. Chronic hip pain  ED Discharge Orders    None       Elpidio AnisUpstill, Jonmarc Bodkin, Cordelia Poche-C 09/04/17 0724    Dione BoozeGlick, David, MD 09/04/17 [phone]

## 2017-09-04 NOTE — ED Notes (Signed)
Transported to xray 

## 2017-09-04 NOTE — ED Notes (Signed)
Pt discharged from ED; instructions provided and scripts given; Pt encouraged to return to ED if symptoms worsen and to f/u with PCP; Pt verbalized understanding of all instructions 

## 2017-12-17 ENCOUNTER — Emergency Department (HOSPITAL_COMMUNITY)
Admission: EM | Admit: 2017-12-17 | Discharge: 2017-12-18 | Disposition: A | Discharge status: Home / Self Care | Payer: Self-pay | Attending: Emergency Medicine | Admitting: Emergency Medicine

## 2017-12-17 ENCOUNTER — Other Ambulatory Visit: Payer: Self-pay

## 2017-12-17 ENCOUNTER — Encounter (HOSPITAL_COMMUNITY): Payer: Self-pay | Admitting: Emergency Medicine

## 2017-12-17 DIAGNOSIS — F17210 Nicotine dependence, cigarettes, uncomplicated: Secondary | ICD-10-CM | POA: Insufficient documentation

## 2017-12-17 DIAGNOSIS — M25551 Pain in right hip: Secondary | ICD-10-CM

## 2017-12-17 DIAGNOSIS — Z79899 Other long term (current) drug therapy: Secondary | ICD-10-CM | POA: Insufficient documentation

## 2017-12-17 DIAGNOSIS — I10 Essential (primary) hypertension: Secondary | ICD-10-CM | POA: Insufficient documentation

## 2017-12-17 DIAGNOSIS — M25552 Pain in left hip: Secondary | ICD-10-CM

## 2017-12-17 DIAGNOSIS — M160 Bilateral primary osteoarthritis of hip: Secondary | ICD-10-CM | POA: Insufficient documentation

## 2017-12-17 HISTORY — DX: Pain in unspecified hip: M25.559

## 2017-12-17 NOTE — ED Triage Notes (Signed)
C/o chronic hip pain.  Reports he has arthritis in hips.

## 2017-12-18 MED ORDER — MELOXICAM 15 MG PO TABS: 15.0000 mg | ORAL_TABLET | Freq: Every day | ORAL | 0 refills | Status: DC

## 2017-12-18 MED ORDER — KETOROLAC TROMETHAMINE 60 MG/2ML IM SOLN
60.0000 mg | Freq: Once | INTRAMUSCULAR | Status: AC
  Administered 2017-12-18: 60 mg via INTRAMUSCULAR
  Filled 2017-12-18: qty 2

## 2017-12-18 MED ORDER — ACETAMINOPHEN 500 MG PO TABS
1000.0000 mg | ORAL_TABLET | Freq: Once | ORAL | Status: AC
  Administered 2017-12-18: 1000 mg via ORAL
  Filled 2017-12-18: qty 2

## 2017-12-18 NOTE — Discharge Instructions (Addendum)
1. Medications: Mobic (instead of Ibuprofen), Tylenol 500mg  every 8 hours - DO NOT COMBINE WITH ALCOHOL.  If you continue to drink alcohol, do not take Tylenol, usual home medications 2. Treatment: rest, drink plenty of fluids,  3. Follow Up: Please followup with [facility] and Dr. Aundria Rudogers for further discussion of your hip pain

## 2017-12-18 NOTE — ED Provider Notes (Signed)
[facility] EMERGENCY DEPARTMENT Provider Note   CSN: [id number] Arrival date & time: 12/17/17  2314     History   Chief Complaint Chief Complaint  Patient presents with  . Hip Pain    HPI Kevin [name] is a 47 y.o. male with a hx of acid reflux, alcohol abuse, anxiety, depression, chronic hip pain, hypertension presents to the Emergency Department complaining of gradual, persistent, progressively worsening lateral hip pain onset more than 1 year ago but worse in the last 3 weeks.  He reports he is taking ibuprofen without significant relief.  Patient reports that walking makes his symptoms significantly worse.  Rest makes them only minimally better.  He reports changes position, specifically getting out of bed is worst in the morning.  He reports that once he starts walking he is able to move slightly better but continues to have significant pain.  He reports that at times the pain radiates from his left hip down to his left knee.  He denies low back pain.  He denies known falls or injuries.  Patient denies IV drug use.  He does report that he intermittently drinks alcohol.  Record review shows that he does have a history of alcohol abuse.  The history is provided by the patient and medical records. No language interpreter was used.    Past Medical History:  Diagnosis Date  . Acid reflux   . Alcohol abuse   . Anxiety   . Depression   . Hip pain   . Hypertension     Patient Active Problem List   Diagnosis Date Noted  . Alcohol dependence (HCC) 02/19/2013  . Major depression, recurrent (HCC) 02/19/2013    Past Surgical History:  Procedure Laterality Date  . alcohol abuse    . FACIAL RECONSTRUCTION SURGERY     d/t facial fx's  . left leg surgery          Home Medications    Prior to Admission medications   Medication Sig Start Date End Date Taking? Authorizing Provider  ibuprofen (ADVIL,MOTRIN) 200 MG tablet Take 800 mg by mouth every 6 (six)  hours as needed for moderate pain.    [provider]  ibuprofen (ADVIL,MOTRIN) 800 MG tablet Take 1 tablet (800 mg total) by mouth 3 (three) times daily. 04/23/16   Joy, Shawn C, PA-C  meloxicam (MOBIC) 15 MG tablet Take 1 tablet (15 mg total) by mouth daily. 12/18/17   Obdulio Mash, Dahlia Client, PA-C  omeprazole (PRILOSEC) 20 MG capsule Take 20 mg by mouth daily.   09/06/11  [provider]    Family History No family history on file.  Social History Social History   Tobacco Use  . Smoking status: Current Every Day Smoker    Packs/day: 0.25    Types: Cigarettes  . Smokeless tobacco: Never Used  Substance Use Topics  . Alcohol use: Yes    Alcohol/week: 1.2 - 1.8 oz    Types: 2 - 3 Glasses of wine per week    Comment: drink wine constantly all day  . Drug use: Yes    Types: Marijuana, Cocaine     Allergies   Patient has no known allergies.   Review of Systems Review of Systems  Constitutional: Negative for chills and fever.  Gastrointestinal: Negative for nausea and vomiting.  Musculoskeletal: Positive for arthralgias and gait problem (Secondary to pain). Negative for back pain, joint swelling, neck pain and neck stiffness.  Skin: Negative for wound.  Neurological: Negative  for numbness.  Hematological: Does not bruise/bleed easily.  Psychiatric/Behavioral: The patient is not nervous/anxious.   All other systems reviewed and are negative.    Physical Exam Updated Vital Signs BP (!) 150/99 (BP Location: Left Arm)   Pulse 82   Temp 99.1 F (37.3 C) (Oral)   Resp 16   SpO2 98%   Physical Exam  Constitutional: He appears well-developed and well-nourished. No distress.  HENT:  Head: Normocephalic and atraumatic.  Eyes: Conjunctivae are normal. No scleral icterus.  Neck: Normal range of motion. Neck supple.  Full ROM without pain  Cardiovascular: Normal rate, regular rhythm and intact distal pulses.  Capillary refill < 3 sec  Pulmonary/Chest: Effort  normal. No respiratory distress.  Musculoskeletal: He exhibits no edema.       Right hip: He exhibits decreased range of motion.       Left hip: He exhibits decreased range of motion.       Right knee: He exhibits normal range of motion.       Left knee: He exhibits normal range of motion.       Right ankle: He exhibits normal range of motion.       Left ankle: He exhibits normal range of motion.  Full range of motion of the T-spine and L-spine No midline tenderness to the  T-spine or L-spine No Tenderness to palpation of the paraspinous muscles of the L-spine Slightly decreased range of motion of the bilateral hips due to pain.  Neurological: He is alert. Coordination normal.  Speech is clear and goal oriented, follows commands Normal 5/5 strength in upper and lower extremities bilaterally including dorsiflexion and plantar flexion, strong and equal grip strength Sensation normal to light and sharp touch Moves extremities without ataxia, coordination intact Antalgic gait with limp.   Normal balance No Clonus  Skin: Skin is warm and dry. No rash noted. He is not diaphoretic. No erythema.  No tenting of the skin  Psychiatric: He has a normal mood and affect. His behavior is normal.  Nursing note and vitals reviewed.    ED Treatments / Results   Procedures Procedures (including critical care time)  Medications Ordered in ED Medications  ketorolac (TORADOL) injection 60 mg (has no administration in time range)  acetaminophen (TYLENOL) tablet 1,000 mg (has no administration in time range)     Initial Impression / Assessment and Plan / ED Course  I have reviewed the triage vital signs and the nursing notes.  Pertinent labs & imaging results that were available during my care of the patient were reviewed by me and considered in my medical decision making (see chart for details).     Patient presents with bilateral hip pain chronic in nature and worsening.  Chart review shows  that patient was seen for something similar in March 2019.  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  I personally evaluated these films and suspect this is the source of patient's pain.  I had a long discussion with patient about the chronic nature of severe osteoarthritis and the potential need for hip replacement.  He is upset about this.  I have recommended that he alternate Tylenol and Motrin however I have clearly stated that he should not take Tylenol if he is drinking and patient states understanding.  Record review shows that he has not had any persistently elevated liver function tests.  I have encouraged the patient to continue ibuprofen (  without Tylenol if EtOH usage continues).  Patient should not exceed 4 g of Tylenol in 24 hours and due to his long alcohol history I have recommended 2 g every 24 hours.  Patient has been given a prescription for meloxicam if he would like to take this in lieu of of ibuprofen.  Patient understands he should not take both meloxicam and ibuprofen.  Patient is to follow-up with [facility] and orthopedics.    Final Clinical Impressions(s) / ED Diagnoses   Final diagnoses:  Bilateral hip pain  Osteoarthritis of both hips, unspecified osteoarthritis type    ED Discharge Orders        Ordered    meloxicam (MOBIC) 15 MG tablet  Daily     12/18/17 0521       Darice Vicario, Dahlia Client, PA-C 12/18/17 0524    Glynn Octave, MD 12/18/17 [phone]

## 2018-04-15 ENCOUNTER — Emergency Department (HOSPITAL_COMMUNITY): Payer: Self-pay

## 2018-04-15 ENCOUNTER — Emergency Department (HOSPITAL_COMMUNITY)
Admission: EM | Admit: 2018-04-15 | Discharge: 2018-04-15 | Disposition: A | Discharge status: Home / Self Care | Payer: Self-pay | Attending: Emergency Medicine | Admitting: Emergency Medicine

## 2018-04-15 ENCOUNTER — Encounter (HOSPITAL_COMMUNITY): Payer: Self-pay | Admitting: Emergency Medicine

## 2018-04-15 DIAGNOSIS — F17210 Nicotine dependence, cigarettes, uncomplicated: Secondary | ICD-10-CM | POA: Insufficient documentation

## 2018-04-15 DIAGNOSIS — R609 Edema, unspecified: Secondary | ICD-10-CM | POA: Insufficient documentation

## 2018-04-15 DIAGNOSIS — I10 Essential (primary) hypertension: Secondary | ICD-10-CM | POA: Insufficient documentation

## 2018-04-15 DIAGNOSIS — Z79899 Other long term (current) drug therapy: Secondary | ICD-10-CM | POA: Insufficient documentation

## 2018-04-15 LAB — COMPREHENSIVE METABOLIC PANEL
ALBUMIN: 3.4 g/dL — AB (ref 3.5–5.0)
ALK PHOS: 88 U/L (ref 38–126)
ALT: 22 U/L (ref 0–44)
ANION GAP: 8 (ref 5–15)
AST: 44 U/L — AB (ref 15–41)
BUN: 13 mg/dL (ref 6–20)
CALCIUM: 8.8 mg/dL — AB (ref 8.9–10.3)
CHLORIDE: 104 mmol/L (ref 98–111)
CO2: 24 mmol/L (ref 22–32)
Creatinine, Ser: 0.79 mg/dL (ref 0.61–1.24)
GFR calc non Af Amer: >60 mL/min (ref >60)
GLUCOSE: 94 mg/dL (ref 70–99)
Potassium: 3.6 mmol/L (ref 3.5–5.1)
Sodium: 136 mmol/L (ref 135–145)
Total Bilirubin: 0.4 mg/dL (ref 0.3–1.2)
Total Protein: 6.8 g/dL (ref 6.5–8.1)

## 2018-04-15 LAB — CBC WITH DIFFERENTIAL/PLATELET
Abs Immature Granulocytes: 0.11 10*3/uL — ABNORMAL HIGH (ref 0.00–0.07)
BASOS ABS: 0.1 10*3/uL (ref 0.0–0.1)
Basophils Relative: 1 %
EOS ABS: 0.1 10*3/uL (ref 0.0–0.5)
Eosinophils Relative: 1 %
HEMATOCRIT: 32.7 % — AB (ref 39.0–52.0)
HEMOGLOBIN: 10.9 g/dL — AB (ref 13.0–17.0)
IMMATURE GRANULOCYTES: 2 %
LYMPHS ABS: 1.4 10*3/uL (ref 0.7–4.0)
LYMPHS PCT: 27 %
MCH: 30.5 pg (ref 26.0–34.0)
MCHC: 33.3 g/dL (ref 30.0–36.0)
MCV: 91.6 fL (ref 80.0–100.0)
MONOS PCT: 11 %
Monocytes Absolute: 0.6 10*3/uL (ref 0.1–1.0)
NEUTROS PCT: 58 %
NRBC: 0 % (ref 0.0–0.2)
Neutro Abs: 3.1 10*3/uL (ref 1.7–7.7)
Platelets: 347 10*3/uL (ref 150–400)
RBC: 3.57 MIL/uL — ABNORMAL LOW (ref 4.22–5.81)
RDW: 14.9 % (ref 11.5–15.5)
WBC: 5.4 10*3/uL (ref 4.0–10.5)

## 2018-04-15 LAB — BRAIN NATRIURETIC PEPTIDE: B Natriuretic Peptide: 21 pg/mL (ref 0.0–100.0)

## 2018-04-15 MED ORDER — KETOROLAC TROMETHAMINE 30 MG/ML IJ SOLN
15.0000 mg | Freq: Once | INTRAMUSCULAR | Status: AC
Start: 2018-04-15 — End: 2018-04-15
  Administered 2018-04-15: 15 mg via INTRAVENOUS
  Filled 2018-04-15: qty 1

## 2018-04-15 MED ORDER — OXYCODONE-ACETAMINOPHEN 5-325 MG PO TABS
1.0000 | ORAL_TABLET | Freq: Once | ORAL | Status: AC
  Administered 2018-04-15: 1 via ORAL
  Filled 2018-04-15: qty 1

## 2018-04-15 MED ORDER — FUROSEMIDE 20 MG PO TABS: 20.0000 mg | ORAL_TABLET | Freq: Every day | ORAL | 0 refills | Status: DC

## 2018-04-15 MED ORDER — FUROSEMIDE 10 MG/ML IJ SOLN
40.0000 mg | Freq: Once | INTRAMUSCULAR | Status: AC
  Administered 2018-04-15: 40 mg via INTRAVENOUS
  Filled 2018-04-15: qty 4

## 2018-04-15 NOTE — ED Triage Notes (Addendum)
Per EMS, pt c/o increased hip pain due to the cold weather, states he should have had surgery last year but didn't.  Pt uses walker at home.  Pt states his legs are swelling.  Pt is drenched from the rain, given paper scrubs and blankets to warm up.

## 2018-04-15 NOTE — ED Provider Notes (Signed)
[facility] EMERGENCY DEPARTMENT Provider Note   CSN: [id number] Arrival date & time: 04/15/18  0011     History   Chief Complaint Chief Complaint  Patient presents with  . Hip Pain  . Leg Swelling    HPI Kevin [name] is a 47 y.o. male.  Patient presents to the emergency department with multiple complaints.  Patient reports that he was out in the cold in the rain tonight and started to have pain in his bones.  Patient reports a history of problems with his hips, has been experiencing pain in the hips but also has noticed that all of his bones "pop" at times.  He has also noticed that both of his legs are swollen.  He has not noticed any chest pain or shortness of breath associated with the symptoms.     Past Medical History:  Diagnosis Date  . Acid reflux   . Alcohol abuse   . Anxiety   . Depression   . Hip pain   . Hypertension     Patient Active Problem List   Diagnosis Date Noted  . Alcohol dependence (HCC) 02/19/2013  . Major depression, recurrent (HCC) 02/19/2013    Past Surgical History:  Procedure Laterality Date  . alcohol abuse    . FACIAL RECONSTRUCTION SURGERY     d/t facial fx's  . left leg surgery          Home Medications    Prior to Admission medications   Medication Sig Start Date End Date Taking? Authorizing Provider  ibuprofen (ADVIL,MOTRIN) 200 MG tablet Take 800 mg by mouth every 6 (six) hours as needed for moderate pain.   Yes [provider]  ibuprofen (ADVIL,MOTRIN) 800 MG tablet Take 1 tablet (800 mg total) by mouth 3 (three) times daily. Patient not taking: Reported on 04/15/2018 04/23/16   Anselm Pancoast, PA-C  meloxicam (MOBIC) 15 MG tablet Take 1 tablet (15 mg total) by mouth daily. Patient not taking: Reported on 04/15/2018 12/18/17   Muthersbaugh, Dahlia Client, PA-C  omeprazole (PRILOSEC) 20 MG capsule Take 20 mg by mouth daily.   09/06/11  [provider]    Family History No family history on  file.  Social History Social History   Tobacco Use  . Smoking status: Current Every Day Smoker    Packs/day: 0.25    Types: Cigarettes  . Smokeless tobacco: Never Used  Substance Use Topics  . Alcohol use: Yes    Alcohol/week: 2.0 - 3.0 standard drinks    Types: 2 - 3 Glasses of wine per week    Comment: drink wine constantly all day  . Drug use: Yes    Types: Marijuana, Cocaine     Allergies   Patient has no known allergies.   Review of Systems Review of Systems  Respiratory: Negative for shortness of breath.   Cardiovascular: Positive for leg swelling. Negative for chest pain.  Musculoskeletal: Positive for arthralgias.  All other systems reviewed and are negative.    Physical Exam Updated Vital Signs BP 138/88   Pulse 89   Temp 99.1 F (37.3 C) (Oral)   Resp 18   Ht 5\' 6"  (1.676 m)   Wt 72.6 kg   SpO2 99%   BMI 25.82 kg/m   Physical Exam  Constitutional: He is oriented to person, place, and time. He appears well-developed and well-nourished. No distress.  HENT:  Head: Normocephalic and atraumatic.  Right Ear: Hearing normal.  Left Ear: Hearing normal.  Nose: Nose normal.  Mouth/Throat: Oropharynx is clear and moist and mucous membranes are normal.  Eyes: Pupils are equal, round, and reactive to light. Conjunctivae and EOM are normal.  Neck: Normal range of motion. Neck supple.  Cardiovascular: Regular rhythm, S1 normal and S2 normal. Exam reveals no gallop and no friction rub.  No murmur heard. Pulmonary/Chest: Effort normal and breath sounds normal. No respiratory distress. He exhibits no tenderness.  Abdominal: Soft. Normal appearance and bowel sounds are normal. There is no hepatosplenomegaly. There is no tenderness. There is no rebound, no guarding, no tenderness at McBurney's point and negative Murphy's sign. No hernia.  Musculoskeletal: Normal range of motion. He exhibits edema (1+, bilateral pitting).  Neurological: He is alert and oriented to  person, place, and time. He has normal strength. No cranial nerve deficit or sensory deficit. Coordination normal. GCS eye subscore is 4. GCS verbal subscore is 5. GCS motor subscore is 6.  Skin: Skin is warm, dry and intact. No rash noted. No cyanosis.  Psychiatric: He has a normal mood and affect. His speech is normal and behavior is normal. Thought content normal.  Nursing note and vitals reviewed.    ED Treatments / Results  Labs (all labs ordered are listed, but only abnormal results are displayed) Labs Reviewed  CBC WITH DIFFERENTIAL/PLATELET - Abnormal; Notable for the following components:      Result Value   RBC 3.57 (*)    Hemoglobin 10.9 (*)    HCT 32.7 (*)    Abs Immature Granulocytes 0.11 (*)    All other components within normal limits  COMPREHENSIVE METABOLIC PANEL - Abnormal; Notable for the following components:   Calcium 8.8 (*)    Albumin 3.4 (*)    AST 44 (*)    All other components within normal limits  BRAIN NATRIURETIC PEPTIDE    EKG EKG Interpretation  Date/Time:  Friday April 15 2018 05:31:51 EDT Ventricular Rate:  79 PR Interval:    QRS Duration: 93 QT Interval:  385 QTC Calculation: 442 R Axis:   74 Text Interpretation:  Sinus rhythm Normal ECG Confirmed by Gilda Crease [phone]) on 04/15/2018 5:36:17 AM   Radiology Dg Chest 2 View  Result Date: 04/15/2018 CLINICAL DATA:  47 year old male with edema. EXAM: CHEST - 2 VIEW COMPARISON:  Chest radiograph dated 09/06/2011 FINDINGS: The heart size and mediastinal contours are within normal limits. Both lungs are clear. The visualized skeletal structures are unremarkable. IMPRESSION: No active cardiopulmonary disease. Electronically Signed   By: Elgie Collard M.D.   On: 04/15/2018 06:02    Procedures Procedures (including critical care time)  Medications Ordered in ED Medications  furosemide (LASIX) injection 40 mg (has no administration in time range)     Initial Impression /  Assessment and Plan / ED Course  I have reviewed the triage vital signs and the nursing notes.  Pertinent labs & imaging results that were available during my care of the patient were reviewed by me and considered in my medical decision making (see chart for details).     Patient presents to the ER with multiple complaints.  Patient complaining of generalized joint pain which has been a chronic condition for him.  He has a history of chronic hip pain secondary to arthritis.  He reports that since he was out in the cold rain his hip started hurting worse.  In addition to this, however, he is complaining of lower extremity edema.  He does have some pitting edema present  without JVD or rales on examination.  He has not hypoxic or short of breath.  Chest x-ray does not show any pathology.  Patient admits that he has been noncompliant with recommendations in the past, was given Lasix here in the ER to help diurese lower extremity peripheral edema.  Final Clinical Impressions(s) / ED Diagnoses   Final diagnoses:  Peripheral edema    ED Discharge Orders    None       Gilda Crease, MD 04/15/18 [phone]

## 2018-05-25 ENCOUNTER — Emergency Department (HOSPITAL_COMMUNITY)
Admission: EM | Admit: 2018-05-25 | Discharge: 2018-05-26 | Disposition: A | Discharge status: Home / Self Care | Payer: Self-pay | Attending: Emergency Medicine | Admitting: Emergency Medicine

## 2018-05-25 ENCOUNTER — Other Ambulatory Visit: Payer: Self-pay

## 2018-05-25 ENCOUNTER — Encounter (HOSPITAL_COMMUNITY): Payer: Self-pay | Admitting: Emergency Medicine

## 2018-05-25 ENCOUNTER — Emergency Department (HOSPITAL_COMMUNITY): Payer: Self-pay

## 2018-05-25 DIAGNOSIS — M160 Bilateral primary osteoarthritis of hip: Secondary | ICD-10-CM | POA: Insufficient documentation

## 2018-05-25 DIAGNOSIS — I10 Essential (primary) hypertension: Secondary | ICD-10-CM | POA: Insufficient documentation

## 2018-05-25 MED ORDER — HYDROCODONE-ACETAMINOPHEN 5-325 MG PO TABS: 1.0000 | ORAL_TABLET | ORAL | 0 refills | Status: DC | PRN

## 2018-05-25 MED ORDER — IBUPROFEN 200 MG PO TABS
400.0000 mg | ORAL_TABLET | Freq: Once | ORAL | Status: AC
  Administered 2018-05-25: 400 mg via ORAL
  Filled 2018-05-25: qty 2

## 2018-05-25 MED ORDER — HYDROCODONE-ACETAMINOPHEN 5-325 MG PO TABS: 1.0000 | ORAL_TABLET | ORAL | Status: DC | PRN

## 2018-05-25 NOTE — ED Provider Notes (Signed)
[facility]-EMERGENCY DEPT Provider Note   CSN: [id number] Arrival date & time: 05/25/18  2034     History   Chief Complaint Chief Complaint  Patient presents with  . Leg Pain    HPI Morene RankinsFredrick D Calvert is a 47 y.o. male.  HPI   She is here for evaluation of persistent pain in both hips and swelling in both legs.  Previously evaluated for same in the setting.  He has not followed up with a physician as suggested.  He denies other problems.  He admits to being homeless occasionally but currently is staying with a friend.  He is not taking anything for the discomfort.  He understands his prior diagnosis of osteoarthritis of the hips.  Has a history of alcohol abuse.  He denies fever, cough or chest pain.  There are no other no modifying factors.  Past Medical History:  Diagnosis Date  . Acid reflux   . Alcohol abuse   . Anxiety   . Depression   . Hip pain   . Hypertension     Patient Active Problem List   Diagnosis Date Noted  . Alcohol dependence (HCC) 02/19/2013  . Major depression, recurrent (HCC) 02/19/2013    Past Surgical History:  Procedure Laterality Date  . alcohol abuse    . FACIAL RECONSTRUCTION SURGERY     d/t facial fx's  . left leg surgery          Home Medications    Prior to Admission medications   Medication Sig Start Date End Date Taking? Authorizing Provider  furosemide (LASIX) 20 MG tablet Take 1 tablet (20 mg total) by mouth daily. 04/15/18   Gilda CreasePollina, Christopher J, MD  HYDROcodone-acetaminophen (NORCO) 5-325 MG tablet Take 1 tablet by mouth every 4 (four) hours as needed for moderate pain. 05/25/18   Mancel BaleWentz, Malaya Cagley, MD  ibuprofen (ADVIL,MOTRIN) 200 MG tablet Take 800 mg by mouth every 6 (six) hours as needed for moderate pain.    [provider]  ibuprofen (ADVIL,MOTRIN) 800 MG tablet Take 1 tablet (800 mg total) by mouth 3 (three) times daily. Patient not taking: Reported on 04/15/2018 04/23/16   Anselm PancoastJoy, Shawn C,  PA-C  meloxicam (MOBIC) 15 MG tablet Take 1 tablet (15 mg total) by mouth daily. Patient not taking: Reported on 04/15/2018 12/18/17   Muthersbaugh, Dahlia ClientHannah, PA-C  omeprazole (PRILOSEC) 20 MG capsule Take 20 mg by mouth daily.   09/06/11  [provider]    Family History No family history on file.  Social History Social History   Tobacco Use  . Smoking status: Current Every Day Smoker    Packs/day: 0.25    Types: Cigarettes  . Smokeless tobacco: Never Used  Substance Use Topics  . Alcohol use: Yes    Alcohol/week: 2.0 - 3.0 standard drinks    Types: 2 - 3 Glasses of wine per week    Comment: drink wine constantly all day  . Drug use: Yes    Types: Marijuana, Cocaine     Allergies   Patient has no known allergies.   Review of Systems Review of Systems  All other systems reviewed and are negative.    Physical Exam Updated Vital Signs BP (!) 144/93 (BP Location: Left Arm)   Pulse (!) 56   Temp 98.5 F (36.9 C)   Resp 19   SpO2 100%   Physical Exam  Constitutional: He is oriented to person, place, and time. He appears well-developed and well-nourished.  HENT:  Head: Normocephalic and atraumatic.  Right Ear: External ear normal.  Left Ear: External ear normal.  Eyes: Pupils are equal, round, and reactive to light. Conjunctivae and EOM are normal.  Neck: Normal range of motion and phonation normal. Neck supple.  Cardiovascular: Normal rate.  Pulmonary/Chest: Effort normal. He exhibits no bony tenderness.  Musculoskeletal:  Bilateral hip pain with passive and active range of motion.  2+ lower leg edema bilaterally.  Neurological: He is alert and oriented to person, place, and time. No cranial nerve deficit or sensory deficit. He exhibits normal muscle tone. Coordination normal.  Skin: Skin is warm, dry and intact.  Psychiatric: He has a normal mood and affect. His behavior is normal. Judgment and thought content normal.  Nursing note and vitals  reviewed.    ED Treatments / Results  Labs (all labs ordered are listed, but only abnormal results are displayed) Labs Reviewed - No data to display  EKG None  Radiology Dg Pelvis 1-2 Views  Result Date: 05/25/2018 CLINICAL DATA:  47 year old male with painful hips with difficulty walking. No known trauma. EXAM: PELVIS - 1-2 VIEW COMPARISON:  Right hip radiograph dated 09/04/2017 FINDINGS: Severe bilateral osteoarthritic changes of the hips. There is subchondral sclerosis and cysts. There has been significant interval worsening with collapse of the femoral heads since the radiograph of 09/04/2017. There are sclerotic changes and irregularity of the acetabular roofs bilaterally. There is superior positioning of the femoral heads within the acetabula. There is impacted appearance of the right femoral head on the right acetabular roof. No acute fracture. The bones are mildly osteopenic for age. The soft tissues are unremarkable. IMPRESSION: 1. No acute fracture or dislocation. 2. Severe bilateral hip osteoarthritis with significant interval worsening and collapse of the femoral heads since the radiograph of 09/04/2017. Electronically Signed   By: Elgie Collard M.D.   On: 05/25/2018 22:10    Procedures Procedures (including critical care time)  Medications Ordered in ED Medications  HYDROcodone-acetaminophen (NORCO/VICODIN) 5-325 MG per tablet 1 tablet (has no administration in time range)  ibuprofen (ADVIL,MOTRIN) tablet 400 mg (400 mg Oral Given 05/25/18 2219)     Initial Impression / Assessment and Plan / ED Course  I have reviewed the triage vital signs and the nursing notes.  Pertinent labs & imaging results that were available during my care of the patient were reviewed by me and considered in my medical decision making (see chart for details).      Patient Vitals for the past 24 hrs:  BP Temp Pulse Resp SpO2  05/25/18 2223 (!) 144/93 - (!) 56 19 100 %  05/25/18 2054 (!)  157/106 98.5 F (36.9 C) (!) 102 20 100 %    10:59 PM Reevaluation with update and discussion. After initial assessment and treatment, an updated evaluation reveals no change in clinical status.  Findings discussed with the patient and all questions were answered. Mancel Bale   Medical Decision Making: Worsening bilateral hip arthritis, nonspecific etiology.  No evidence for fracture of the hips.  This is an extremely painful condition that will require hip surgery, replacement.  Patient has limited access to care and he is indigent.  Case management has been consulted by telephone, by me.  Patient also will be given case management's phone number to contact since he does not have a telephone.  Patient has incidental bilateral lower leg edema secondary to limited activity.  No indication for treatment with diuretics at this time.  CRITICAL CARE-no Performed by: Mechele Collin  Effie Shy  Nursing Notes Reviewed/ Care Coordinated Applicable Imaging Reviewed Interpretation of Laboratory Data incorporated into ED treatment  The patient appears reasonably screened and/or stabilized for discharge and I doubt any other medical condition or other [facility] requiring further screening, evaluation, or treatment in the ED at this time prior to discharge.  Plan: Home Medications-ibuprofen for pain, supplement with narcotic if needed; Home Treatments-rest, use walker for ambulation; return here if the recommended treatment, does not improve the symptoms; Recommended follow up-orthopedic follow-up as soon as possible.   Final Clinical Impressions(s) / ED Diagnoses   Final diagnoses:  Osteoarthritis of both hips, unspecified osteoarthritis type    ED Discharge Orders         Ordered    HYDROcodone-acetaminophen (NORCO) 5-325 MG tablet  Every 4 hours PRN     05/25/18 2256           Mancel Bale, MD 05/25/18 2302

## 2018-05-25 NOTE — ED Triage Notes (Signed)
Patient c/o bilateral leg pain for months. Ambulatory with with walker.

## 2018-05-25 NOTE — Discharge Instructions (Signed)
It appears that he will need to have hip joint replacements to manage the pain in your hips.  Both of your hips show severe arthritis, on the x-ray, which is getting worse in the last 8 months.  Call the orthopedic doctor, Dr. Ave Filterhandler for an appointment to be seen as soon as possible.  Call the case manager, at [phone], to get help with scheduling follow-up appointments and medications.

## 2018-05-26 ENCOUNTER — Other Ambulatory Visit: Payer: Self-pay

## 2018-07-04 ENCOUNTER — Emergency Department (HOSPITAL_COMMUNITY)
Admission: EM | Admit: 2018-07-04 | Discharge: 2018-07-04 | Disposition: A | Discharge status: Home / Self Care | Payer: Self-pay | Attending: Emergency Medicine | Admitting: Emergency Medicine

## 2018-07-04 ENCOUNTER — Encounter (HOSPITAL_COMMUNITY): Payer: Self-pay

## 2018-07-04 ENCOUNTER — Other Ambulatory Visit: Payer: Self-pay

## 2018-07-04 ENCOUNTER — Emergency Department (HOSPITAL_COMMUNITY): Payer: Self-pay

## 2018-07-04 DIAGNOSIS — F10939 Alcohol use, unspecified with withdrawal, unspecified: Secondary | ICD-10-CM

## 2018-07-04 DIAGNOSIS — F10239 Alcohol dependence with withdrawal, unspecified: Secondary | ICD-10-CM | POA: Insufficient documentation

## 2018-07-04 DIAGNOSIS — I10 Essential (primary) hypertension: Secondary | ICD-10-CM | POA: Insufficient documentation

## 2018-07-04 DIAGNOSIS — F17210 Nicotine dependence, cigarettes, uncomplicated: Secondary | ICD-10-CM | POA: Insufficient documentation

## 2018-07-04 DIAGNOSIS — Z79899 Other long term (current) drug therapy: Secondary | ICD-10-CM | POA: Insufficient documentation

## 2018-07-04 DIAGNOSIS — R569 Unspecified convulsions: Secondary | ICD-10-CM | POA: Insufficient documentation

## 2018-07-04 LAB — RAPID URINE DRUG SCREEN, HOSP PERFORMED
Amphetamines: NOT DETECTED
Barbiturates: NOT DETECTED
Benzodiazepines: NOT DETECTED
Cocaine: NOT DETECTED
Opiates: NOT DETECTED
Tetrahydrocannabinol: NOT DETECTED

## 2018-07-04 LAB — COMPREHENSIVE METABOLIC PANEL
ALBUMIN: 3.6 g/dL (ref 3.5–5.0)
ALT: 31 U/L (ref 0–44)
AST: 50 U/L — AB (ref 15–41)
Alkaline Phosphatase: 86 U/L (ref 38–126)
Anion gap: 16 — ABNORMAL HIGH (ref 5–15)
BUN: 10 mg/dL (ref 6–20)
CHLORIDE: 98 mmol/L (ref 98–111)
CO2: 21 mmol/L — ABNORMAL LOW (ref 22–32)
CREATININE: 0.87 mg/dL (ref 0.61–1.24)
Calcium: 9.1 mg/dL (ref 8.9–10.3)
GFR calc Af Amer: >60 mL/min (ref >60)
GLUCOSE: 112 mg/dL — AB (ref 70–99)
POTASSIUM: 3.5 mmol/L (ref 3.5–5.1)
SODIUM: 135 mmol/L (ref 135–145)
TOTAL PROTEIN: 7.5 g/dL (ref 6.5–8.1)
Total Bilirubin: 0.6 mg/dL (ref 0.3–1.2)

## 2018-07-04 LAB — CBC WITH DIFFERENTIAL/PLATELET
ABS IMMATURE GRANULOCYTES: 0.03 10*3/uL (ref 0.00–0.07)
BASOS PCT: 1 %
Basophils Absolute: 0 10*3/uL (ref 0.0–0.1)
EOS ABS: 0.1 10*3/uL (ref 0.0–0.5)
Eosinophils Relative: 1 %
HCT: 34.1 % — ABNORMAL LOW (ref 39.0–52.0)
Hemoglobin: 11.4 g/dL — ABNORMAL LOW (ref 13.0–17.0)
IMMATURE GRANULOCYTES: 1 %
Lymphocytes Relative: 17 %
Lymphs Abs: 1.1 10*3/uL (ref 0.7–4.0)
MCH: 30.8 pg (ref 26.0–34.0)
MCHC: 33.4 g/dL (ref 30.0–36.0)
MCV: 92.2 fL (ref 80.0–100.0)
MONOS PCT: 10 %
Monocytes Absolute: 0.6 10*3/uL (ref 0.1–1.0)
NEUTROS ABS: 4.4 10*3/uL (ref 1.7–7.7)
NEUTROS PCT: 70 %
PLATELETS: 358 10*3/uL (ref 150–400)
RBC: 3.7 MIL/uL — AB (ref 4.22–5.81)
RDW: 13.4 % (ref 11.5–15.5)
WBC: 6.3 10*3/uL (ref 4.0–10.5)
nRBC: 0 % (ref 0.0–0.2)

## 2018-07-04 LAB — LIPASE, BLOOD: Lipase: 29 U/L (ref 11–51)

## 2018-07-04 MED ORDER — SODIUM CHLORIDE 0.9 % IV BOLUS
500.0000 mL | Freq: Once | INTRAVENOUS | Status: AC
  Administered 2018-07-04: 500 mL via INTRAVENOUS

## 2018-07-04 MED ORDER — CHLORDIAZEPOXIDE HCL 25 MG PO CAPS
50.0000 mg | ORAL_CAPSULE | Freq: Once | ORAL | Status: AC
  Administered 2018-07-04: 50 mg via ORAL
  Filled 2018-07-04: qty 2

## 2018-07-04 MED ORDER — THIAMINE HCL 100 MG/ML IJ SOLN
100.0000 mg | Freq: Once | INTRAMUSCULAR | Status: AC
  Administered 2018-07-04: 100 mg via INTRAVENOUS
  Filled 2018-07-04: qty 2

## 2018-07-04 MED ORDER — LORAZEPAM 2 MG/ML IJ SOLN
1.0000 mg | INTRAMUSCULAR | Status: DC | PRN
  Administered 2018-07-04: 1 mg via INTRAVENOUS
  Filled 2018-07-04: qty 1

## 2018-07-04 MED ORDER — IBUPROFEN 400 MG PO TABS
600.0000 mg | ORAL_TABLET | Freq: Once | ORAL | Status: AC
  Administered 2018-07-04: 600 mg via ORAL
  Filled 2018-07-04: qty 1

## 2018-07-04 MED ORDER — CHLORDIAZEPOXIDE HCL 25 MG PO CAPS: ORAL_CAPSULE | ORAL | 0 refills | Status: DC

## 2018-07-04 NOTE — ED Provider Notes (Signed)
[name]   CSN: [id number] Arrival date & time: 07/04/18  1935     History   Chief Complaint Chief Complaint  Patient presents with  . Seizures    HPI Kevin [name] is a 48 y.o. male.  HPI   Kevin RankinsFredrick D [name] is a 48 y.o. male with PMH of acid reflux, alcohol abuse, anxiety, depression, hip pain, hypertension who presents with concern for new onset seizure.  Patient reports that he has profound hip arthritis bilaterally and walks with a walker at baseline.  He attempted to stand and going to the overflow homeless shelter this evening when he rapidly lost consciousness and was found to have what appeared to be 2 seizures described as tonic-clonic although patient does not recall these.  He reports that he drinks about 140 ounce malt liquor daily and last drink about 24 to 36 hours ago.  Has never had a withdrawal seizure before but had been progressively more tremulous throughout the day today.  No other recent falls or trauma.  Has worsening pain in his right hip compared to his baseline after the fall.  At this time, the patient feels like he has a slight tremor but otherwise feels normal.  Past Medical History:  Diagnosis Date  . Acid reflux   . Alcohol abuse   . Anxiety   . Depression   . Hip pain   . Hypertension     Patient Active Problem List   Diagnosis Date Noted  . Alcohol dependence (HCC) 02/19/2013  . Major depression, recurrent (HCC) 02/19/2013    Past Surgical History:  Procedure Laterality Date  . alcohol abuse    . FACIAL RECONSTRUCTION SURGERY     d/t facial fx's  . left leg surgery          Home Medications    Prior to Admission medications   Medication Sig Start Date End Date Taking? Authorizing Provider  chlordiazePOXIDE (LIBRIUM) 25 MG capsule 50mg  PO TID x 1D, then 25-50mg  PO BID X 1D, then 25-50mg  PO QD X 1D 07/05/18   Sherena Machorro, Rhona LeavensJames F II, MD  furosemide (LASIX) 20 MG tablet  Take 1 tablet (20 mg total) by mouth daily. 04/15/18   Gilda CreasePollina, Christopher J, MD  HYDROcodone-acetaminophen (NORCO) 5-325 MG tablet Take 1 tablet by mouth every 4 (four) hours as needed for moderate pain. 05/25/18   Mancel BaleWentz, Elliott, MD  ibuprofen (ADVIL,MOTRIN) 200 MG tablet Take 800 mg by mouth every 6 (six) hours as needed for moderate pain.    [provider]  ibuprofen (ADVIL,MOTRIN) 800 MG tablet Take 1 tablet (800 mg total) by mouth 3 (three) times daily. Patient not taking: Reported on 04/15/2018 04/23/16   Anselm PancoastJoy, Shawn C, PA-C  meloxicam (MOBIC) 15 MG tablet Take 1 tablet (15 mg total) by mouth daily. Patient not taking: Reported on 04/15/2018 12/18/17   Muthersbaugh, Dahlia ClientHannah, PA-C  omeprazole (PRILOSEC) 20 MG capsule Take 20 mg by mouth daily.   09/06/11  [provider]    Family History History reviewed. No pertinent family history.  Social History Social History   Tobacco Use  . Smoking status: Current Every Day Smoker    Packs/day: 0.25    Types: Cigarettes  . Smokeless tobacco: Never Used  Substance Use Topics  . Alcohol use: Yes    Alcohol/week: 2.0 - 3.0 standard drinks    Types: 2 - 3 Glasses of wine per week    Comment: drink wine constantly  all day  . Drug use: Yes    Types: Marijuana, Cocaine     Allergies   Patient has no known allergies.   Review of Systems Review of Systems  Constitutional: Negative for chills and fever.  HENT: Negative for ear pain and sore throat.   Eyes: Negative for pain and visual disturbance.  Respiratory: Negative for cough and shortness of breath.   Cardiovascular: Negative for chest pain and palpitations.  Gastrointestinal: Negative for abdominal pain and vomiting.  Genitourinary: Negative for dysuria and hematuria.  Musculoskeletal: Positive for arthralgias, gait problem and myalgias. Negative for back pain.  Skin: Negative for color change and rash.  Neurological: Positive for seizures. Negative for syncope.    All other systems reviewed and are negative.    Physical Exam Updated Vital Signs BP (!) 166/101   Pulse 100   Temp 98.7 F (37.1 C) (Oral)   Resp 15   Ht 5\' 5"  (1.651 m)   Wt 74.8 kg   SpO2 100%   BMI 27.46 kg/m   Physical Exam Vitals signs and nursing note reviewed.  Constitutional:      Appearance: He is well-developed. He is ill-appearing (chronically ill appearing).  HENT:     Head: Normocephalic and atraumatic.  Eyes:     Conjunctiva/sclera: Conjunctivae normal.  Neck:     Musculoskeletal: Neck supple.  Cardiovascular:     Rate and Rhythm: Normal rate and regular rhythm.     Heart sounds: No murmur.  Pulmonary:     Effort: Pulmonary effort is normal. No respiratory distress.     Breath sounds: Normal breath sounds.  Abdominal:     General: Abdomen is flat.     Palpations: Abdomen is soft.     Tenderness: There is no abdominal tenderness. There is no guarding or rebound. Negative signs include Murphy's sign, Rovsing's sign and McBurney's sign.  Musculoskeletal:     Right hip: He exhibits tenderness and bony tenderness.     Left hip: He exhibits tenderness and bony tenderness.  Skin:    General: Skin is warm and dry.  Neurological:     General: No focal deficit present.     Mental Status: He is alert and oriented to person, place, and time.     GCS: GCS eye subscore is 4. GCS verbal subscore is 5. GCS motor subscore is 6.     Cranial Nerves: Cranial nerves are intact.     Sensory: Sensation is intact.     Motor: Tremor (mild) present.  Psychiatric:        Behavior: Behavior is cooperative.    ED Treatments / Results  Labs (all labs ordered are listed, but only abnormal results are displayed) Labs Reviewed  CBC WITH DIFFERENTIAL/PLATELET - Abnormal; Notable for the following components:      Result Value   RBC 3.70 (*)    Hemoglobin 11.4 (*)    HCT 34.1 (*)    All other components within normal limits  COMPREHENSIVE METABOLIC PANEL - Abnormal;  Notable for the following components:   CO2 21 (*)    Glucose, Bld 112 (*)    AST 50 (*)    Anion gap 16 (*)    All other components within normal limits  LIPASE, BLOOD  RAPID URINE DRUG SCREEN, HOSP PERFORMED  CBG MONITORING, ED    EKG EKG Interpretation  Date/Time:  Monday July 04 2018 21:03:28 EST Ventricular Rate:  98 PR Interval:    QRS Duration: 97 QT Interval:  384 QTC Calculation: 491 R Axis:   66 Text Interpretation:  Sinus rhythm ST depr, consider ischemia, inferior leads Borderline prolonged QT interval When compared with ECG of 04/15/2018, QT has lengthened Confirmed by Dione Booze ([id number]) on 07/04/2018 11:00:44 PM   Radiology Dg Pelvis 1-2 Views  Result Date: 07/04/2018 CLINICAL DATA:  Bilateral hip pain EXAM: PELVIS - 1-2 VIEW COMPARISON:  05/25/2018 FINDINGS: Severe bilateral hip osteoarthrosis with destruction of the superior half of both femoral heads. No acute fracture. No pelvic diastasis. IMPRESSION: Severe bilateral hip osteoarthrosis with large areas of destruction of both femoral heads, but no acute fracture or dislocation. Electronically Signed   By: Deatra Robinson M.D.   On: 07/04/2018 21:35   Ct Head Wo Contrast  Result Date: 07/04/2018 CLINICAL DATA:  Initial evaluation for acute seizure. EXAM: CT HEAD WITHOUT CONTRAST TECHNIQUE: Contiguous axial images were obtained from the base of the skull through the vertex without intravenous contrast. COMPARISON:  Prior CT from 09/06/2011. FINDINGS: Brain: Advanced cerebral atrophy for age, progressed from previous. No evidence for acute intracranial hemorrhage. No findings to suggest acute large vessel territory infarct. No mass lesion, midline shift, or mass effect. Ventricles are normal in size without evidence for hydrocephalus. No extra-axial fluid collection identified. Vascular: No hyperdense vessel identified. Skull: Scalp soft tissues demonstrate no acute abnormality. Calvarium intact. Sinuses/Orbits: Globes  and orbital soft tissues within normal limits. Visualized paranasal sinuses are clear. No mastoid effusion. IMPRESSION: 1. No acute intracranial abnormality. 2. Advanced cerebral atrophy for age, progressed relative to most recent CT from 2013. Electronically Signed   By: Rise Mu M.D.   On: 07/04/2018 21:31    Procedures Procedures (including critical care time)  Medications Ordered in ED Medications  LORazepam (ATIVAN) injection 1-2 mg (1 mg Intravenous Given 07/04/18 2143)  sodium chloride 0.9 % bolus 500 mL (0 mLs Intravenous Stopped 07/04/18 2343)  chlordiazePOXIDE (LIBRIUM) capsule 50 mg (50 mg Oral Given 07/04/18 2142)  thiamine (B-1) injection 100 mg (100 mg Intravenous Given 07/04/18 2142)  ibuprofen (ADVIL,MOTRIN) tablet 600 mg (600 mg Oral Given 07/04/18 2142)     Initial Impression / Assessment and Plan / ED Course  I have reviewed the triage vital signs and the nursing notes.  Pertinent labs & imaging results that were available during my care of the patient were reviewed by me and considered in my medical decision making (see chart for details).      MDM:  Imaging: CT head shows no acute pathology.  X-ray of the pelvis bilaterally shows severe bilateral hip osteoarthritis with large areas of destruction of both femoral heads but no acute fracture or dislocation.  ED Provider Interpretation of EKG: Sinus rhythm with rate of 98 bpm, normal axis, no ST segment additional depression, pathology to a changes or significant interval regularity other than prolonged QTC at 491.  Labs: UDS negative, lipase 29, CMP overall unremarkable with exception of AST of 50 and CO2 of 21 with gap of 16, CBC unremarkable.  On initial evaluation, patient appears chronically ill. Afebrile and hemodynamically stable. Alert and oriented x4, pleasant, and cooperative.  Patient presents with what appeared to be a first-time seizure this evening.  On exam, patient has no focal neurologic  deficit.  Acute on chronic pain in his bilateral hips with known severe arthrosis.  Walks with a walker at baseline.  When he stood this evening to walk into the shelter he appeared to have tonic-clonic seizure.  Regained consciousness fairly quickly.  He  had no preceding symptoms such as lightheadedness, tachycardia or palpitations.  No chest pain.  No history of the same.  However, he reports that this is the longest he is gone without drinking and consciously made the decision not to drink this evening after a friend counseled him not to.  He was not aware of the risks of rapid cessation.  CT head shows no acute pathology.  For range of motion of his neck without pain.  No pain to palpation.  Low risk by [place] C-spine rule.  Labs as above with mild metabolic acidosis with what is likely lactic acidosis in the setting of seizure.  Given IV normal saline bolus in the ED and 50 mg of p.o. Librium.  He desires alcohol cessation was counseled on the importance of following Librium taper at discharge.  Given resources at discharge for outpatient follow-up of alcohol use disorder.  X-ray of the hips bilaterally show no acute change from his baseline.  Able to ambulate with his walker in the ED.  No evidence for cardiac pathology and low suspicion for cardiac syncope.  EKG showed no evidence for acute ischemia or arrhythmia.  He was discharged in stable condition with return precautions.  The plan for this patient was discussed with Dr. Lockie Mola who voiced agreement and who oversaw evaluation and treatment of this patient.   The patient was fully informed and involved with the history taking, evaluation, workup including labs/images, and plan. The patient's concerns and questions were addressed to the patient's satisfaction and he expressed agreement with the plan to DC home.    Final Clinical Impressions(s) / ED Diagnoses   Final diagnoses:  Seizure (HCC)  Alcohol withdrawal syndrome with complication  [facility])    ED Discharge Orders         Ordered    chlordiazePOXIDE (LIBRIUM) 25 MG capsule     07/04/18 2258           Clarisa Fling, MD 07/05/18 0030    Virgina Norfolk, DO 07/05/18 0038

## 2018-07-04 NOTE — ED Triage Notes (Signed)
Pt arrived via EMS was in public and was reported to have had a seiizure about 30 sec., 10 sec post ictal, followed by second seizure.  Pt denies seizure hx.

## 2018-07-04 NOTE — ED Notes (Signed)
Pt instructed to stay in bed and call for any needs without gettting up.  Side rails padded and bed in lowest position.

## 2018-07-04 NOTE — Discharge Instructions (Addendum)
Do not drink while taking librium taper. Please see detox resources we have given you.  Do not take the librium taper if you are going to drink your normal 40 oz beer per day as this can cause you to become too drowsy to walk, talk, or breathe on your own and could be potentially life threatening.

## 2019-01-18 ENCOUNTER — Encounter (HOSPITAL_COMMUNITY): Payer: Self-pay

## 2019-01-18 ENCOUNTER — Emergency Department (HOSPITAL_COMMUNITY): Payer: Medicaid Other

## 2019-01-18 ENCOUNTER — Other Ambulatory Visit: Payer: Self-pay

## 2019-01-18 ENCOUNTER — Emergency Department (HOSPITAL_COMMUNITY)
Admission: EM | Admit: 2019-01-18 | Discharge: 2019-01-18 | Disposition: A | Discharge status: Home / Self Care | Payer: Medicaid Other | Attending: Emergency Medicine | Admitting: Emergency Medicine

## 2019-01-18 DIAGNOSIS — F17210 Nicotine dependence, cigarettes, uncomplicated: Secondary | ICD-10-CM | POA: Diagnosis not present

## 2019-01-18 DIAGNOSIS — R2243 Localized swelling, mass and lump, lower limb, bilateral: Secondary | ICD-10-CM | POA: Diagnosis present

## 2019-01-18 DIAGNOSIS — M25552 Pain in left hip: Secondary | ICD-10-CM | POA: Diagnosis not present

## 2019-01-18 DIAGNOSIS — M25551 Pain in right hip: Secondary | ICD-10-CM | POA: Insufficient documentation

## 2019-01-18 DIAGNOSIS — Z9119 Patient's noncompliance with other medical treatment and regimen: Secondary | ICD-10-CM | POA: Insufficient documentation

## 2019-01-18 DIAGNOSIS — Z590 Homelessness: Secondary | ICD-10-CM | POA: Insufficient documentation

## 2019-01-18 DIAGNOSIS — I10 Essential (primary) hypertension: Secondary | ICD-10-CM | POA: Diagnosis not present

## 2019-01-18 DIAGNOSIS — R609 Edema, unspecified: Secondary | ICD-10-CM

## 2019-01-18 DIAGNOSIS — R600 Localized edema: Secondary | ICD-10-CM | POA: Diagnosis not present

## 2019-01-18 LAB — CBC WITH DIFFERENTIAL/PLATELET
Abs Immature Granulocytes: 0.16 10*3/uL — ABNORMAL HIGH (ref 0.00–0.07)
Basophils Absolute: 0 10*3/uL (ref 0.0–0.1)
Basophils Relative: 1 %
Eosinophils Absolute: 0.1 10*3/uL (ref 0.0–0.5)
Eosinophils Relative: 2 %
HCT: 28.8 % — ABNORMAL LOW (ref 39.0–52.0)
Hemoglobin: 9.7 g/dL — ABNORMAL LOW (ref 13.0–17.0)
Immature Granulocytes: 3 %
Lymphocytes Relative: 17 %
Lymphs Abs: 1 10*3/uL (ref 0.7–4.0)
MCH: 30 pg (ref 26.0–34.0)
MCHC: 33.7 g/dL (ref 30.0–36.0)
MCV: 89.2 fL (ref 80.0–100.0)
Monocytes Absolute: 0.6 10*3/uL (ref 0.1–1.0)
Monocytes Relative: 11 %
Neutro Abs: 3.7 10*3/uL (ref 1.7–7.7)
Neutrophils Relative %: 66 %
Platelets: 310 10*3/uL (ref 150–400)
RBC: 3.23 MIL/uL — ABNORMAL LOW (ref 4.22–5.81)
RDW: 18.4 % — ABNORMAL HIGH (ref 11.5–15.5)
WBC: 5.6 10*3/uL (ref 4.0–10.5)
nRBC: 0 % (ref 0.0–0.2)

## 2019-01-18 LAB — BASIC METABOLIC PANEL
Anion gap: 12 (ref 5–15)
BUN: 10 mg/dL (ref 6–20)
CO2: 21 mmol/L — ABNORMAL LOW (ref 22–32)
Calcium: 8 mg/dL — ABNORMAL LOW (ref 8.9–10.3)
Chloride: 103 mmol/L (ref 98–111)
Creatinine, Ser: 0.68 mg/dL (ref 0.61–1.24)
GFR calc Af Amer: >60 mL/min (ref >60)
GFR calc non Af Amer: >60 mL/min (ref >60)
Glucose, Bld: 92 mg/dL (ref 70–99)
Potassium: 3.2 mmol/L — ABNORMAL LOW (ref 3.5–5.1)
Sodium: 136 mmol/L (ref 135–145)

## 2019-01-18 LAB — BRAIN NATRIURETIC PEPTIDE: B Natriuretic Peptide: 64.5 pg/mL (ref 0.0–100.0)

## 2019-01-18 MED ORDER — POTASSIUM CHLORIDE CRYS ER 20 MEQ PO TBCR
40.0000 meq | EXTENDED_RELEASE_TABLET | Freq: Once | ORAL | Status: AC
  Administered 2019-01-18: 16:00:00 40 meq via ORAL
  Filled 2019-01-18: qty 2

## 2019-01-18 MED ORDER — FUROSEMIDE 20 MG PO TABS
40.0000 mg | ORAL_TABLET | Freq: Once | ORAL | Status: AC
  Administered 2019-01-18: 16:00:00 40 mg via ORAL
  Filled 2019-01-18: qty 2

## 2019-01-18 MED ORDER — IOHEXOL 300 MG/ML  SOLN
100.0000 mL | Freq: Once | INTRAMUSCULAR | Status: AC | PRN
  Administered 2019-01-18: 15:00:00 100 mL via INTRAVENOUS

## 2019-01-18 NOTE — ED Notes (Signed)
Patient transported to CT 

## 2019-01-18 NOTE — TOC Initial Note (Signed)
Transition of Care [facility]) - Initial/Assessment Note    Patient Details  Name: CHRISHUN SCHEER MRN: [id number] Date of Birth: Mar 30, 1971  [facility] CM/SW Contact:    Fuller Mandril, RN Phone Number: 01/18/2019, 2:01 PM  Clinical Narrative:                 [facility] consulted regarding medication assistance and orthopedic follow-up.  Expected Discharge Plan: Home/Self Care Barriers to Discharge: Barriers Resolved   Patient Goals and CMS Choice Patient states their goals for this hospitalization and ongoing recovery are:: see someone about my hips      Expected Discharge Plan and Services Expected Discharge Plan: Home/Self Care In-house Referral: Clinical Social Work Discharge Planning Services: CM Consult, Follow-up appt scheduled   Living arrangements for the past 2 months: Homeless Shelter Expected Discharge Date: 01/18/19                                    Prior Living Arrangements/Services Living arrangements for the past 2 months: [place] with:: Self Patient language and need for interpreter reviewed:: Yes Do you feel safe going back to the place where you live?: Yes      Need for Family Participation in Patient Care: No (Comment) Care giver support system in place?: No (comment)   Criminal Activity/Legal Involvement Pertinent to Current Situation/Hospitalization: No - Comment as needed  Activities of Daily Living      Permission Sought/Granted Permission sought to share information with : PCP Permission granted to share information with : Yes, Verbal Permission Granted              Emotional Assessment Appearance:: Appears stated age Attitude/Demeanor/Rapport: Engaged Affect (typically observed): Appropriate Orientation: : Oriented to Self, Oriented to Place, Oriented to  Time, Oriented to Situation Alcohol / Substance Use: Tobacco Use Psych Involvement: No (comment)  Admission diagnosis:  BI LAT LEG EDEMA AND  PAIN Patient Active Problem List   Diagnosis Date Noted  . Alcohol dependence ([place]) 02/19/2013  . Major depression, recurrent ([place]) 02/19/2013   PCP:  Marliss Coots, NP Pharmacy:   [facility] [place], [address] [place] [address] Phone: [phone] Fax: [place], [place] [address] [place] [address] Phone: [phone] Fax: [phone]     Social Determinants of Health (SDOH) Interventions    Readmission Risk Interventions No flowsheet data found.

## 2019-01-18 NOTE — TOC Transition Note (Signed)
Transition of Care [facility]) - CM/SW Discharge Note   Patient Details  Name: Kevin [name] MRN: [id number] Date of Birth: Apr 24, 1971  Transition of Care [facility]) CM/SW Contact:  Fuller Mandril, RN Phone Number: 01/18/2019, 2:49 PM   Clinical Narrative:       Final next level of care: Home/Self Care Barriers to Discharge: Homeless with medical needs   Patient Goals and CMS Choice Patient states their goals for this hospitalization and ongoing recovery are:: see someone about my hips      Discharge Placement                       Discharge Plan and Services In-house Referral: Clinical Social Work Discharge Planning Services: CM Consult, Follow-up appt scheduled                   EDCM placed call to Reginal Lutes, NP to confirm pt is active.  Bevely Palmer confirms that she has not seen pt lately but will gladly ensure he get medication and refer him to community liaison to assist him with enrolling in the orange card program that will enable him to see specialists.              Social Determinants of Health (SDOH) Interventions     Readmission Risk Interventions No flowsheet data found.

## 2019-01-18 NOTE — ED Triage Notes (Signed)
Patient complains of bilateral hip pain and swelling. Patient states that the swelling is all related to needing hip replacement surgery. No SOB, no CP. Denies new injury

## 2019-01-18 NOTE — ED Notes (Signed)
CT called by this RN at this time, informed that there are 2 other pt's in front of him for scan.  Pt is updated on POC and informed that he cannot eat until results of CT scan come back.

## 2019-01-18 NOTE — Progress Notes (Signed)
CSW received consult for patient to assist with obtaining his medications. CSW notified RN CM Rosendo Gros of consult and requested her assistance.  Madilyn Fireman, MSW, LCSW-A Clinical Social Worker Transitions of [place] [facility] [phone]

## 2019-01-18 NOTE — ED Notes (Signed)
C/o bilateral hip pain states he was suppose to have bilateral hip surgery however waiting on insurance clearance. Bilateral lower ext very swollen for several months. States they are hurting more today.

## 2019-01-18 NOTE — ED Notes (Signed)
Transported to CT 

## 2019-01-18 NOTE — TOC Progression Note (Signed)
Transition of Care [facility]) - Progression Note    Patient Details  Name: Kevin [name] MRN: [id number] Date of Birth: 04/19/1971  Transition of Care [facility]) CM/SW Contact  Fuller Mandril, RN Phone Number: 01/18/2019, 2:47 PM  Clinical Narrative:    [facility] spoke with pt at bedside to find that pt is active with medical services at the [facility] and Reginal Lutes, NP serves as PCP.  Pt states he has not been there lately.   Expected Discharge Plan: Home/Self Care Barriers to Discharge: Homeless with medical needs  Expected Discharge Plan and Services Expected Discharge Plan: Home/Self Care In-house Referral: Clinical Social Work Discharge Planning Services: CM Consult, Follow-up appt scheduled   Living arrangements for the past 2 months: Homeless Shelter Expected Discharge Date: 01/18/19                                     Social Determinants of Health (SDOH) Interventions    Readmission Risk Interventions No flowsheet data found.

## 2019-01-18 NOTE — ED Notes (Signed)
Patient aware he is waiting on CT

## 2019-01-18 NOTE — Discharge Instructions (Addendum)
Please call Kevin [name] to be seen tomorrow. She can re-start your lasix medication and help you get to see an orthopedic surgeon.

## 2019-01-18 NOTE — ED Provider Notes (Signed)
Emergency Department Provider Note   I have reviewed the triage vital signs and the nursing notes.   HISTORY  Chief Complaint bilateral hip pain/swelling   HPI Kevin [name] is a 48 y.o. male with past medical history of alcohol abuse, homelessness, and hypertension presents to the emergency department for evaluation of bilateral hip pain and lower extremity swelling.  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  He states he is been referred to orthopedic surgery for hip replacement evaluation but cannot afford the co-pay and surgery appointments.  He states that he uses a walker and does not have significant shortness of breath or chest pain with ambulation.  He does have pain in both hips which limits his mobility.  He has developed bilateral lower extremity swelling worsening over the past several weeks.  He states he had been on Lasix previously but ran out of this prescription and has not followed with his PCP for refill.  He denies any orthopnea or PND. No fever or cough. No CP. No fall or new injury.    Past Medical History:  Diagnosis Date  . Acid reflux   . Alcohol abuse   . Anxiety   . Depression   . Hip pain   . Hypertension     Patient Active Problem List   Diagnosis Date Noted  . Alcohol dependence ([place]) 02/19/2013  . Major depression, recurrent ([place]) 02/19/2013    Past Surgical History:  Procedure Laterality Date  . alcohol abuse    . FACIAL RECONSTRUCTION SURGERY     d/t facial fx's  . left leg surgery      Allergies Patient has no known allergies.  No family history on file.  Social History Social History   Tobacco Use  . Smoking status: Current Every Day Smoker    Packs/day: 0.25    Types: Cigarettes  . Smokeless tobacco: Never Used  Substance Use Topics  . Alcohol use: Yes    Alcohol/week: 2.0 - 3.0 standard drinks    Types: 2 - 3 Glasses of wine per week    Comment: drink wine constantly all day  . Drug  use: Yes    Types: Marijuana, Cocaine    Review of Systems  Constitutional: No fever/chills Eyes: No visual changes. ENT: No sore throat. Cardiovascular: Denies chest pain. Positive bilateral LE edema.  Respiratory: Denies shortness of breath. Gastrointestinal: No abdominal pain.  No nausea, no vomiting.  No diarrhea.  No constipation. Genitourinary: Negative for dysuria. Musculoskeletal: Negative for back pain. Positive bilateral hip pain.  Skin: Negative for rash. Neurological: Negative for headaches, focal weakness or numbness.  10-point ROS otherwise negative.  ____________________________________________   PHYSICAL EXAM:  VITAL SIGNS: ED Triage Vitals  Enc Vitals Group     BP 01/18/19 0959 (!) 165/122     Pulse Rate 01/18/19 0959 89     Resp 01/18/19 0959 18     Temp 01/18/19 0959 98.2 F (36.8 C)     Temp Source 01/18/19 0959 Oral     SpO2 01/18/19 0959 98 %   Constitutional: Alert and oriented. Well appearing and in no acute distress. Eyes: Conjunctivae are normal.  Head: Atraumatic. Nose: No congestion/rhinnorhea. Mouth/Throat: Mucous membranes are moist.  Neck: No stridor.   Cardiovascular: Normal rate, regular rhythm. Good peripheral circulation. Grossly normal heart sounds.   Respiratory: Normal respiratory effort.  No retractions. Lungs CTAB. Gastrointestinal: Soft and nontender. No distention.  Musculoskeletal: Bilateral pitting edema in  the lower extremities to the mid-calf. No gross deformities of extremities. Pain with passive ROM of bilateral hips. No shortening or obvious deformity. No unilateral swelling.  Neurologic:  Normal speech and language. No gross focal neurologic deficits are appreciated.  Skin:  Skin is warm, dry and intact. No rash noted.   ____________________________________________   LABS (all labs ordered are listed, but only abnormal results are displayed)  Labs Reviewed  BASIC METABOLIC PANEL - Abnormal; Notable for the  following components:      Result Value   Potassium 3.2 (*)    CO2 21 (*)    Calcium 8.0 (*)    All other components within normal limits  CBC WITH DIFFERENTIAL/PLATELET - Abnormal; Notable for the following components:   RBC 3.23 (*)    Hemoglobin 9.7 (*)    HCT 28.8 (*)    RDW 18.4 (*)    Abs Immature Granulocytes 0.16 (*)    All other components within normal limits  BRAIN NATRIURETIC PEPTIDE   ____________________________________________  EKG   EKG Interpretation  Date/Time:  Wednesday January 18 2019 10:53:23 EDT Ventricular Rate:  70 PR Interval:    QRS Duration: 94 QT Interval:  428 QTC Calculation: 462 R Axis:   47 Text Interpretation:  Sinus rhythm Minimal ST depression, lateral leads Similar to prior tracing.  No STEMI  Confirmed by Alona BeneLong, Adylene Dlugosz [phone] on 01/18/2019 10:55:49 AM       ____________________________________________  RADIOLOGY  Dg Chest 2 View  Result Date: 01/18/2019 CLINICAL DATA:  Lower extremity edema and mild dyspnea. EXAM: CHEST - 2 VIEW COMPARISON:  Chest radiograph 04/15/2018 FINDINGS: Heart size within normal limits. Tortuous thoracic aorta, similar to prior exams. No focal consolidation within the lungs. No acute bony abnormality. Artifact from overlying EKG leads. IMPRESSION: Clear lungs. Electronically Signed   By: Jackey LogeKyle  Golden   On: 01/18/2019 11:31   Ct Pelvis W Contrast  Result Date: 01/18/2019 CLINICAL DATA:  Chronic bilateral hip pain and swelling. No known injury. EXAM: CT PELVIS WITH CONTRAST TECHNIQUE: Multidetector CT imaging of the pelvis was performed using the standard protocol following the bolus administration of intravenous contrast. CONTRAST:  100 mL OMNIPAQUE IOHEXOL 300 MG/ML  SOLN COMPARISON:  Plain films of the pelvis 07/04/2018 and 05/25/2018. FINDINGS: Urinary Tract:  No abnormality visualized. Bowel:  Unremarkable visualized pelvic bowel loops. Vascular/Lymphatic: No pathologically enlarged lymph nodes. No significant  vascular abnormality seen. Reproductive:  No mass or other significant abnormality Other:  None. Musculoskeletal: As seen on the prior exams, extensive bony destructive change about both hips is slightly worse on the left. 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 Multiple cysts or erosions are present about both hips. Marked synovial thickening about both hips appears slightly worse on the right. No fracture or other acute bony abnormality is identified. SI joints, symphysis pubis and lower lumbar spine appear normal. A small volume of fluid is seen in the right iliopsoas bursa consistent with bursitis. IMPRESSION: No acute abnormality. Synovitis, extensive osteolysis of the femoral heads, bilateral thinning and remodeling of the right and left acetabulum and extensive erosive change about both hips are chronic. Differential considerations include remote avascular necrosis with severe secondary degenerative change, chronic crystal or inflammatory arthropathy or less likely remote/chronic osteomyelitis. Small volume of fluid the right iliopsoas bursa consistent with bursitis. Electronically Signed   By: Drusilla Kannerhomas  Dalessio M.D.  On: 01/18/2019 15:26   Dg Hips Bilat W Or Wo Pelvis 2 Views  Result Date: 01/18/2019 CLINICAL DATA:  Chronic bilateral hip pain. EXAM: DG HIP (WITH OR WITHOUT PELVIS) 2V BILAT COMPARISON:  September 04, 2017 FINDINGS: Markedly abnormal appearance of bilateral femoral heads with collapse of there superior portions, irregular sclerotic appearance of the remaining parts of the femoral heads. There is lateral subluxation of the right femoral head. The findings have advanced from the prior radiograph. Lytic and sclerotic changes of bilateral acetabuli with flattening of the left acetabulum. IMPRESSION: Markedly abnormal appearance of bilateral femoral heads with collapse of the  superior portions, irregular sclerotic appearance of the remaining femoral heads. Lateral subluxation of the right femoral head. These findings have advanced from the previous radiograph dated March 2019. Deformity and mixed lytic and sclerotic appearance of the acetabula bilaterally. These findings may represent avascular necrosis, synovitis/osteomyelitis, or severe arthropathy. Electronically Signed   By: Ted Mcalpineobrinka  Dimitrova M.D.   On: 01/18/2019 11:34    ____________________________________________   PROCEDURES  Procedure(s) performed:   Procedures  None ____________________________________________   INITIAL IMPRESSION / ASSESSMENT AND PLAN / ED COURSE  Pertinent labs & imaging results that were available during my care of the patient were reviewed by me and considered in my medical decision making (see chart for details).   Patient presents to the emergency department for evaluation of chronic bilateral hip pain with bilateral lower extremity edema.  No crackles on exam.  No subjective shortness of breath symptoms.  Patient has been off of his Lasix due to running out of refills.  He would likely benefit from social work consultation to assist with medication affordability.  He states that he has been referred for the "orange card" and has plans to work on this tomorrow.   Plain films reviewed. Followed with CT pelvis with contrast with osteomyelitis in the differential but only chronic findings on CT. Labs reviewed. Patient to see his PCP tomorrow for refills of meds. Social work called his PCP to arrange. Discussed with patient. Can be referred to Ortho from PCP as well. Discussed plan and ED return precautions.  ____________________________________________  FINAL CLINICAL IMPRESSION(S) / ED DIAGNOSES  Final diagnoses:  Peripheral edema  Bilateral hip pain     MEDICATIONS GIVEN DURING THIS VISIT:  Medications  iohexol (OMNIPAQUE) 300 MG/ML solution 100 mL (100 mLs  Intravenous Contrast Given 01/18/19 1506)  furosemide (LASIX) tablet 40 mg (40 mg Oral Given 01/18/19 1616)  potassium chloride SA (K-DUR) CR tablet 40 mEq (40 mEq Oral Given 01/18/19 1617)    Note:  This document was prepared using Dragon voice recognition software and may include unintentional dictation errors.  Alona BeneJoshua Chanon Loney, MD Emergency Medicine    Murle Hellstrom, Arlyss RepressJoshua G, MD 01/18/19 [phone]

## 2021-06-30 ENCOUNTER — Emergency Department (HOSPITAL_COMMUNITY): Payer: Medicaid Other

## 2021-06-30 ENCOUNTER — Other Ambulatory Visit: Payer: Self-pay

## 2021-06-30 ENCOUNTER — Inpatient Hospital Stay (HOSPITAL_COMMUNITY)
Admission: EM | Admit: 2021-06-30 | Discharge: 2021-07-07 | DRG: 199 | Disposition: A | Discharge status: Home / Self Care | Payer: Medicaid Other | Attending: Family Medicine | Admitting: Family Medicine

## 2021-06-30 ENCOUNTER — Encounter (HOSPITAL_COMMUNITY): Payer: Self-pay | Admitting: Emergency Medicine

## 2021-06-30 DIAGNOSIS — F10239 Alcohol dependence with withdrawal, unspecified: Secondary | ICD-10-CM | POA: Diagnosis not present

## 2021-06-30 DIAGNOSIS — K2970 Gastritis, unspecified, without bleeding: Secondary | ICD-10-CM | POA: Diagnosis present

## 2021-06-30 DIAGNOSIS — F419 Anxiety disorder, unspecified: Secondary | ICD-10-CM | POA: Diagnosis present

## 2021-06-30 DIAGNOSIS — D649 Anemia, unspecified: Secondary | ICD-10-CM | POA: Diagnosis present

## 2021-06-30 DIAGNOSIS — F17210 Nicotine dependence, cigarettes, uncomplicated: Secondary | ICD-10-CM | POA: Diagnosis present

## 2021-06-30 DIAGNOSIS — K223 Perforation of esophagus: Secondary | ICD-10-CM

## 2021-06-30 DIAGNOSIS — R350 Frequency of micturition: Secondary | ICD-10-CM | POA: Diagnosis not present

## 2021-06-30 DIAGNOSIS — R7989 Other specified abnormal findings of blood chemistry: Secondary | ICD-10-CM | POA: Diagnosis present

## 2021-06-30 DIAGNOSIS — F1410 Cocaine abuse, uncomplicated: Secondary | ICD-10-CM | POA: Diagnosis present

## 2021-06-30 DIAGNOSIS — J9811 Atelectasis: Secondary | ICD-10-CM | POA: Diagnosis present

## 2021-06-30 DIAGNOSIS — Z9181 History of falling: Secondary | ICD-10-CM

## 2021-06-30 DIAGNOSIS — K219 Gastro-esophageal reflux disease without esophagitis: Secondary | ICD-10-CM | POA: Diagnosis present

## 2021-06-30 DIAGNOSIS — E876 Hypokalemia: Secondary | ICD-10-CM | POA: Diagnosis present

## 2021-06-30 DIAGNOSIS — F1990 Other psychoactive substance use, unspecified, uncomplicated: Secondary | ICD-10-CM | POA: Diagnosis present

## 2021-06-30 DIAGNOSIS — Z5901 Sheltered homelessness: Secondary | ICD-10-CM

## 2021-06-30 DIAGNOSIS — R319 Hematuria, unspecified: Secondary | ICD-10-CM | POA: Diagnosis not present

## 2021-06-30 DIAGNOSIS — R159 Full incontinence of feces: Secondary | ICD-10-CM | POA: Diagnosis not present

## 2021-06-30 DIAGNOSIS — K1120 Sialoadenitis, unspecified: Secondary | ICD-10-CM | POA: Diagnosis present

## 2021-06-30 DIAGNOSIS — K920 Hematemesis: Secondary | ICD-10-CM | POA: Diagnosis present

## 2021-06-30 DIAGNOSIS — F1020 Alcohol dependence, uncomplicated: Secondary | ICD-10-CM | POA: Diagnosis present

## 2021-06-30 DIAGNOSIS — Z8249 Family history of ischemic heart disease and other diseases of the circulatory system: Secondary | ICD-10-CM

## 2021-06-30 DIAGNOSIS — K760 Fatty (change of) liver, not elsewhere classified: Secondary | ICD-10-CM | POA: Diagnosis present

## 2021-06-30 DIAGNOSIS — R748 Abnormal levels of other serum enzymes: Secondary | ICD-10-CM

## 2021-06-30 DIAGNOSIS — T797XXA Traumatic subcutaneous emphysema, initial encounter: Principal | ICD-10-CM | POA: Diagnosis present

## 2021-06-30 DIAGNOSIS — E878 Other disorders of electrolyte and fluid balance, not elsewhere classified: Secondary | ICD-10-CM | POA: Diagnosis present

## 2021-06-30 DIAGNOSIS — R011 Cardiac murmur, unspecified: Secondary | ICD-10-CM | POA: Diagnosis present

## 2021-06-30 DIAGNOSIS — D72829 Elevated white blood cell count, unspecified: Secondary | ICD-10-CM | POA: Diagnosis present

## 2021-06-30 DIAGNOSIS — I10 Essential (primary) hypertension: Secondary | ICD-10-CM | POA: Diagnosis present

## 2021-06-30 DIAGNOSIS — Z634 Disappearance and death of family member: Secondary | ICD-10-CM

## 2021-06-30 DIAGNOSIS — Z20822 Contact with and (suspected) exposure to covid-19: Secondary | ICD-10-CM | POA: Diagnosis present

## 2021-06-30 DIAGNOSIS — J982 Interstitial emphysema: Secondary | ICD-10-CM | POA: Diagnosis present

## 2021-06-30 DIAGNOSIS — X58XXXA Exposure to other specified factors, initial encounter: Secondary | ICD-10-CM | POA: Diagnosis present

## 2021-06-30 LAB — CBC WITH DIFFERENTIAL/PLATELET
Abs Immature Granulocytes: 0.11 10*3/uL — ABNORMAL HIGH (ref 0.00–0.07)
Basophils Absolute: 0 10*3/uL (ref 0.0–0.1)
Basophils Relative: 0 %
Eosinophils Absolute: 0 10*3/uL (ref 0.0–0.5)
Eosinophils Relative: 0 %
HCT: 37 % — ABNORMAL LOW (ref 39.0–52.0)
Hemoglobin: 12.7 g/dL — ABNORMAL LOW (ref 13.0–17.0)
Immature Granulocytes: 1 %
Lymphocytes Relative: 5 %
Lymphs Abs: 0.9 10*3/uL (ref 0.7–4.0)
MCH: 32.2 pg (ref 26.0–34.0)
MCHC: 34.3 g/dL (ref 30.0–36.0)
MCV: 93.9 fL (ref 80.0–100.0)
Monocytes Absolute: 1.1 10*3/uL — ABNORMAL HIGH (ref 0.1–1.0)
Monocytes Relative: 7 %
Neutro Abs: 14.9 10*3/uL — ABNORMAL HIGH (ref 1.7–7.7)
Neutrophils Relative %: 87 %
Platelets: 316 10*3/uL (ref 150–400)
RBC: 3.94 MIL/uL — ABNORMAL LOW (ref 4.22–5.81)
RDW: 13.8 % (ref 11.5–15.5)
WBC: 17 10*3/uL — ABNORMAL HIGH (ref 4.0–10.5)
nRBC: 0 % (ref 0.0–0.2)

## 2021-06-30 LAB — BASIC METABOLIC PANEL
Anion gap: 12 (ref 5–15)
BUN: 10 mg/dL (ref 6–20)
CO2: 24 mmol/L (ref 22–32)
Calcium: 8.2 mg/dL — ABNORMAL LOW (ref 8.9–10.3)
Chloride: 97 mmol/L — ABNORMAL LOW (ref 98–111)
Creatinine, Ser: 0.93 mg/dL (ref 0.61–1.24)
GFR, Estimated: >60 mL/min (ref >60)
Glucose, Bld: 126 mg/dL — ABNORMAL HIGH (ref 70–99)
Potassium: 3.1 mmol/L — ABNORMAL LOW (ref 3.5–5.1)
Sodium: 133 mmol/L — ABNORMAL LOW (ref 135–145)

## 2021-06-30 LAB — RESP PANEL BY RT-PCR (FLU A&B, COVID) ARPGX2
Influenza A by PCR: NEGATIVE
Influenza B by PCR: NEGATIVE
SARS Coronavirus 2 by RT PCR: NEGATIVE

## 2021-06-30 MED ORDER — ONDANSETRON HCL 4 MG/2ML IJ SOLN
4.0000 mg | Freq: Once | INTRAMUSCULAR | Status: AC
  Administered 2021-06-30: 4 mg via INTRAVENOUS
  Filled 2021-06-30: qty 2

## 2021-06-30 MED ORDER — HYDROMORPHONE HCL 1 MG/ML IJ SOLN
1.0000 mg | Freq: Once | INTRAMUSCULAR | Status: AC
  Administered 2021-06-30: 1 mg via INTRAVENOUS
  Filled 2021-06-30: qty 1

## 2021-06-30 MED ORDER — ACETAMINOPHEN 325 MG PO TABS
650.0000 mg | ORAL_TABLET | Freq: Once | ORAL | Status: AC | PRN
  Administered 2021-07-05: 650 mg via ORAL
  Filled 2021-06-30: qty 2

## 2021-06-30 MED ORDER — EPINEPHRINE 0.3 MG/0.3ML IJ SOAJ
0.3000 mg | Freq: Once | INTRAMUSCULAR | Status: AC
  Administered 2021-06-30: 0.3 mg via INTRAMUSCULAR
  Filled 2021-06-30: qty 0.3

## 2021-06-30 MED ORDER — SODIUM CHLORIDE 0.9 % IV SOLN
1.0000 g | Freq: Once | INTRAVENOUS | Status: AC
  Administered 2021-06-30: 1 g via INTRAVENOUS
  Filled 2021-06-30: qty 10

## 2021-06-30 MED ORDER — FENTANYL CITRATE PF 50 MCG/ML IJ SOSY
50.0000 ug | PREFILLED_SYRINGE | Freq: Once | INTRAMUSCULAR | Status: AC
  Administered 2021-06-30: 50 ug via INTRAVENOUS
  Filled 2021-06-30: qty 1

## 2021-06-30 MED ORDER — DIPHENHYDRAMINE HCL 50 MG/ML IJ SOLN
12.5000 mg | Freq: Once | INTRAMUSCULAR | Status: AC
  Administered 2021-07-01: 12.5 mg via INTRAVENOUS
  Filled 2021-06-30: qty 1

## 2021-06-30 MED ORDER — VANCOMYCIN HCL 1500 MG/300ML IV SOLN
1500.0000 mg | Freq: Once | INTRAVENOUS | Status: DC
  Filled 2021-06-30: qty 300

## 2021-06-30 MED ORDER — PIPERACILLIN-TAZOBACTAM 3.375 G IVPB 30 MIN
3.3750 g | Freq: Once | INTRAVENOUS | Status: AC
  Administered 2021-06-30: 3.375 g via INTRAVENOUS
  Filled 2021-06-30: qty 50

## 2021-06-30 MED ORDER — VANCOMYCIN HCL 10 G IV SOLR
1500.0000 mg | Freq: Once | INTRAVENOUS | Status: AC
  Administered 2021-07-01: 1500 mg via INTRAVENOUS
  Filled 2021-06-30: qty 30

## 2021-06-30 MED ORDER — METHYLPREDNISOLONE SODIUM SUCC 125 MG IJ SOLR
125.0000 mg | Freq: Once | INTRAMUSCULAR | Status: AC
  Administered 2021-06-30: 125 mg via INTRAVENOUS
  Filled 2021-06-30: qty 2

## 2021-06-30 MED ORDER — SODIUM CHLORIDE 0.9 % IV BOLUS
1000.0000 mL | Freq: Once | INTRAVENOUS | Status: AC
  Administered 2021-06-30: 1000 mL via INTRAVENOUS

## 2021-06-30 MED ORDER — IOHEXOL 300 MG/ML  SOLN
100.0000 mL | Freq: Once | INTRAMUSCULAR | Status: AC | PRN
  Administered 2021-06-30: 75 mL via INTRAVENOUS

## 2021-06-30 MED ORDER — METOCLOPRAMIDE HCL 5 MG/ML IJ SOLN
10.0000 mg | Freq: Once | INTRAMUSCULAR | Status: AC
  Administered 2021-07-01: 10 mg via INTRAVENOUS
  Filled 2021-06-30: qty 2

## 2021-06-30 NOTE — Progress Notes (Signed)
A consult was received from an ED physician for Vancomycin and Zosyn per pharmacy dosing.  The patient's profile has been reviewed for ht/wt/allergies/indication/available labs.    A one time order has been placed for Vancomycin 1500mg  IV and Zosyn 3.375gm IV.    Further antibiotics/pharmacy consults should be ordered by admitting physician if indicated.                       Thank you, , PharmD 06/30/2021  11:06 PM

## 2021-06-30 NOTE — ED Notes (Signed)
Pocket knife has been collected. In triage nursing station.

## 2021-06-30 NOTE — ED Provider Notes (Signed)
[facility]-EMERGENCY DEPT Provider Note   CSN: [id number] Arrival date & time: 06/30/21  2000     History  Chief Complaint  Patient presents with   Sore Throat   Cough    Kevin RankinsFredrick D [name] is a 51 y.o. male.  51 year old male presents today for evaluation of acute onset of sore throat, difficulty swallowing, drooling, muffled voice.  He denies recent illness, preceding symptoms such as sore throat, difficulty swallowing, fever or other complaints.  Reports he was doing fine this morning and ate breakfast without difficulty and a few hours ago his symptoms came on.  He has not drank or ate anything since.  Reports it hurts to talk.  The history is provided by the patient. No language interpreter was used.      Home Medications Prior to Admission medications   Medication Sig Start Date End Date Taking? Authorizing Provider  ibuprofen (ADVIL,MOTRIN) 200 MG tablet Take 800 mg by mouth every 6 (six) hours as needed for moderate pain.    [provider]  omeprazole (PRILOSEC) 20 MG capsule Take 20 mg by mouth daily.   09/06/11  [provider]      Allergies    Patient has no known allergies.    Review of Systems   Review of Systems  Constitutional:  Negative for activity change, chills and fever.  Gastrointestinal:  Positive for nausea and vomiting.  All other systems reviewed and are negative.  Physical Exam Updated Vital Signs BP (!) 167/130 (BP Location: Left Arm)    Pulse 96    Temp 98.7 F (37.1 C) (Oral)    Resp 16    SpO2 100%  Physical Exam Vitals and nursing note reviewed.  Constitutional:      General: He is not in acute distress.    Appearance: Normal appearance. He is not ill-appearing.  HENT:     Head: Normocephalic and atraumatic.     Nose: Nose normal.  Eyes:     Conjunctiva/sclera: Conjunctivae normal.  Cardiovascular:     Rate and Rhythm: Normal rate and regular rhythm.  Pulmonary:     Effort: Pulmonary  effort is normal. No respiratory distress.     Breath sounds: No wheezing.  Musculoskeletal:        General: No deformity.     Cervical back: Normal range of motion. Tenderness present.  Skin:    Findings: No rash.  Neurological:     Mental Status: He is alert.    ED Results / Procedures / Treatments   Labs (all labs ordered are listed, but only abnormal results are displayed) Labs Reviewed  GROUP A STREP BY PCR  RESP PANEL BY RT-PCR (FLU A&B, COVID) ARPGX2  BASIC METABOLIC PANEL  CBC WITH DIFFERENTIAL/PLATELET    EKG None  Radiology No results found.  Procedures Procedures    Medications Ordered in ED Medications  acetaminophen (TYLENOL) tablet 650 mg (has no administration in time range)  sodium chloride 0.9 % bolus 1,000 mL (has no administration in time range)  methylPREDNISolone sodium succinate (SOLU-MEDROL) 125 mg/2 mL injection 125 mg (has no administration in time range)    ED Course/ Medical Decision Making/ A&P                           Medical Decision Making  Medical Decision Making / ED Course   This patient presents to the ED for concern of throat swelling, difficulty swallowing, this involves  an extensive number of treatment options, and is a complaint that carries with it a high risk of complications and morbidity.  The differential diagnosis includes peritonsillar abscess, retropharyngeal abscess, anaphylaxis, angioedema, epiglottitis  MDM: 51 year old male presents for sudden onset of throat swelling, sore throat, difficulty swallowing, drooling, muffled voice.  Patient without preceding symptoms prior to symptom onset.  Denies known allergies.  Does endorse nausea and vomiting.  Patient given epinephrine 0.3 mg via EpiPen for concern of anaphylaxis.  CT soft tissue neck, chest x-ray, Solu-Medrol, CBC, BMP, respiratory panel, strep swab ordered.  Patient without improvement following epinephrine x2.  CT soft tissue neck concerning for subcutaneous  air and on review concerning for esophageal perforation CT chest added on.  Discussed with CT surgeon who recommends transfer to come and receive an esophagram to determine if it extravasates into the lungs.  Will discuss with intensivist.  IV hydration and broad-spectrum antibiotics initiated.  Lab Tests: -I ordered, reviewed, and interpreted labs.   The pertinent results include:   Labs Reviewed  GROUP A STREP BY PCR  RESP PANEL BY RT-PCR (FLU A&B, COVID) ARPGX2  BASIC METABOLIC PANEL  CBC WITH DIFFERENTIAL/PLATELET      EKG  EKG Interpretation  Date/Time:    Ventricular Rate:    PR Interval:    QRS Duration:   QT Interval:    QTC Calculation:   R Axis:     Text Interpretation:           Imaging Studies ordered: I ordered imaging studies including chest x-ray, CT soft tissue neck I independently visualized and interpreted imaging. I agree with the radiologist interpretation   Medicines ordered and prescription drug management: Meds ordered this encounter  Medications   acetaminophen (TYLENOL) tablet 650 mg   sodium chloride 0.9 % bolus 1,000 mL   methylPREDNISolone sodium succinate (SOLU-MEDROL) 125 mg/2 mL injection 125 mg    IV methylprednisolone will be converted to either a q12h or q24h frequency with the same total daily dose (TDD).  Ordered Dose: 1 to 125 mg TDD; convert to: TDD q24h.  Ordered Dose: 126 to 250 mg TDD; convert to: TDD div q12h.  Ordered Dose: >250 mg TDD; DAW.   EPINEPHrine (EPI-PEN) injection 0.3 mg    -I have reviewed the patients home medicines and have made adjustments as needed  Critical interventions Broad-spectrum IV antibiotics, IV hydration.  CT surgery consult.  Consultations Obtained: I requested consultation with the cardiothoracic surgeon,  and discussed lab and imaging findings as well as pertinent plan - they recommend: Transfer to [facility] for further evaluation and management including swallow study.   Cardiac  Monitoring: The patient was maintained on a cardiac monitor.  I personally viewed and interpreted the cardiac monitored which showed an underlying rhythm of: Normal sinus rhythm  Reevaluation: After the interventions noted above, I reevaluated the patient and found that they have :stayed the same  Past Medical History:  Diagnosis Date   Acid reflux    Alcohol abuse    Anxiety    Depression    Hip pain    Hypertension       Dispostion: Patient at the end of my shift is awaiting intensivist consult and transfer to [facility].  Patient signed out to Dr. Susa Loffler the end of my shift.   Final Clinical Impression(s) / ED Diagnoses Final diagnoses:  Esophageal perforation    Rx / DC Orders ED Discharge Orders     None  Marita [place], PA-C 06/30/21 2251    Ernie Avena, MD 07/01/21 [phone]

## 2021-06-30 NOTE — ED Provider Triage Note (Addendum)
Emergency Medicine Provider Triage Evaluation Note  LINKEN MCGLOTHEN , a 51 y.o. male  was evaluated in triage.  Pt complains of agile onset, constant, severe, sharp, sore throat that began this morning.  Patient states that he is unable to swallow. He also complains of chills.   Review of Systems  Positive: + sore throat, inability to swallow, hot potato voice Negative: - fevers  Physical Exam  BP (!) 167/130 (BP Location: Left Arm)    Pulse 96    Temp 98.7 F (37.1 C) (Oral)    Resp 16    SpO2 100%  Gen:   Awake, no distress   Resp:  Normal effort  MSK:   Moves extremities without difficulty  Other:  Spitting saliva into emesis bag. Hot potato voice. 2 finger trismus. Unable to visualize posterior oropharynx due to trismus.   Medical Decision Making  Medically screening exam initiated at 8:14 PM.  Appropriate orders placed.  Arlina Robes Snapp was informed that the remainder of the evaluation will be completed by another provider, this initial triage assessment does not replace that evaluation, and the importance of remaining in the ED until their evaluation is complete.  Charge nurse made aware pt needs to go back to a room ASAP.     Tanda Rockers, PA-C 06/30/21 2019

## 2021-06-30 NOTE — ED Triage Notes (Signed)
Patient presents with a sore throat which started today. He has been coughing up mucus and has had difficulty swallowing.   EMS vitals: 180/98 BP 110 HR 96% SPO2  20 RR

## 2021-07-01 ENCOUNTER — Encounter (HOSPITAL_COMMUNITY): Payer: Self-pay

## 2021-07-01 ENCOUNTER — Emergency Department (HOSPITAL_COMMUNITY): Payer: Medicaid Other

## 2021-07-01 DIAGNOSIS — J438 Other emphysema: Secondary | ICD-10-CM | POA: Diagnosis not present

## 2021-07-01 DIAGNOSIS — Z634 Disappearance and death of family member: Secondary | ICD-10-CM | POA: Diagnosis not present

## 2021-07-01 DIAGNOSIS — R0789 Other chest pain: Secondary | ICD-10-CM | POA: Diagnosis not present

## 2021-07-01 DIAGNOSIS — J9811 Atelectasis: Secondary | ICD-10-CM | POA: Diagnosis present

## 2021-07-01 DIAGNOSIS — K920 Hematemesis: Secondary | ICD-10-CM | POA: Diagnosis present

## 2021-07-01 DIAGNOSIS — T797XXA Traumatic subcutaneous emphysema, initial encounter: Secondary | ICD-10-CM | POA: Diagnosis present

## 2021-07-01 DIAGNOSIS — E876 Hypokalemia: Secondary | ICD-10-CM | POA: Diagnosis present

## 2021-07-01 DIAGNOSIS — D72829 Elevated white blood cell count, unspecified: Secondary | ICD-10-CM | POA: Diagnosis present

## 2021-07-01 DIAGNOSIS — R159 Full incontinence of feces: Secondary | ICD-10-CM | POA: Diagnosis not present

## 2021-07-01 DIAGNOSIS — Z8249 Family history of ischemic heart disease and other diseases of the circulatory system: Secondary | ICD-10-CM | POA: Diagnosis not present

## 2021-07-01 DIAGNOSIS — F1990 Other psychoactive substance use, unspecified, uncomplicated: Secondary | ICD-10-CM

## 2021-07-01 DIAGNOSIS — Z9181 History of falling: Secondary | ICD-10-CM | POA: Diagnosis not present

## 2021-07-01 DIAGNOSIS — F10229 Alcohol dependence with intoxication, unspecified: Secondary | ICD-10-CM | POA: Diagnosis not present

## 2021-07-01 DIAGNOSIS — I10 Essential (primary) hypertension: Secondary | ICD-10-CM | POA: Diagnosis present

## 2021-07-01 DIAGNOSIS — X58XXXA Exposure to other specified factors, initial encounter: Secondary | ICD-10-CM | POA: Diagnosis present

## 2021-07-01 DIAGNOSIS — F1410 Cocaine abuse, uncomplicated: Secondary | ICD-10-CM | POA: Diagnosis present

## 2021-07-01 DIAGNOSIS — F10239 Alcohol dependence with withdrawal, unspecified: Secondary | ICD-10-CM | POA: Diagnosis not present

## 2021-07-01 DIAGNOSIS — K223 Perforation of esophagus: Secondary | ICD-10-CM | POA: Diagnosis present

## 2021-07-01 DIAGNOSIS — F17210 Nicotine dependence, cigarettes, uncomplicated: Secondary | ICD-10-CM | POA: Diagnosis present

## 2021-07-01 DIAGNOSIS — F419 Anxiety disorder, unspecified: Secondary | ICD-10-CM | POA: Diagnosis present

## 2021-07-01 DIAGNOSIS — R011 Cardiac murmur, unspecified: Secondary | ICD-10-CM | POA: Diagnosis present

## 2021-07-01 DIAGNOSIS — J982 Interstitial emphysema: Secondary | ICD-10-CM | POA: Diagnosis present

## 2021-07-01 DIAGNOSIS — R748 Abnormal levels of other serum enzymes: Secondary | ICD-10-CM | POA: Diagnosis not present

## 2021-07-01 DIAGNOSIS — K1120 Sialoadenitis, unspecified: Secondary | ICD-10-CM | POA: Diagnosis present

## 2021-07-01 DIAGNOSIS — K219 Gastro-esophageal reflux disease without esophagitis: Secondary | ICD-10-CM | POA: Diagnosis present

## 2021-07-01 DIAGNOSIS — Z5901 Sheltered homelessness: Secondary | ICD-10-CM | POA: Diagnosis not present

## 2021-07-01 DIAGNOSIS — R070 Pain in throat: Secondary | ICD-10-CM | POA: Diagnosis not present

## 2021-07-01 DIAGNOSIS — K760 Fatty (change of) liver, not elsewhere classified: Secondary | ICD-10-CM | POA: Diagnosis present

## 2021-07-01 DIAGNOSIS — D649 Anemia, unspecified: Secondary | ICD-10-CM | POA: Diagnosis present

## 2021-07-01 DIAGNOSIS — Z20822 Contact with and (suspected) exposure to covid-19: Secondary | ICD-10-CM | POA: Diagnosis present

## 2021-07-01 DIAGNOSIS — E878 Other disorders of electrolyte and fluid balance, not elsewhere classified: Secondary | ICD-10-CM | POA: Diagnosis present

## 2021-07-01 DIAGNOSIS — K2970 Gastritis, unspecified, without bleeding: Secondary | ICD-10-CM | POA: Diagnosis present

## 2021-07-01 HISTORY — DX: Other psychoactive substance use, unspecified, uncomplicated: F19.90

## 2021-07-01 LAB — COMPREHENSIVE METABOLIC PANEL
ALT: 41 U/L (ref 0–44)
AST: 54 U/L — ABNORMAL HIGH (ref 15–41)
Albumin: 2.6 g/dL — ABNORMAL LOW (ref 3.5–5.0)
Alkaline Phosphatase: 94 U/L (ref 38–126)
Anion gap: 8 (ref 5–15)
BUN: 18 mg/dL (ref 6–20)
CO2: 24 mmol/L (ref 22–32)
Calcium: 7.7 mg/dL — ABNORMAL LOW (ref 8.9–10.3)
Chloride: 101 mmol/L (ref 98–111)
Creatinine, Ser: 0.97 mg/dL (ref 0.61–1.24)
GFR, Estimated: >60 mL/min (ref >60)
Glucose, Bld: 112 mg/dL — ABNORMAL HIGH (ref 70–99)
Potassium: 3.6 mmol/L (ref 3.5–5.1)
Sodium: 133 mmol/L — ABNORMAL LOW (ref 135–145)
Total Bilirubin: 1.4 mg/dL — ABNORMAL HIGH (ref 0.3–1.2)
Total Protein: 7 g/dL (ref 6.5–8.1)

## 2021-07-01 LAB — URINALYSIS, ROUTINE W REFLEX MICROSCOPIC
Bilirubin Urine: NEGATIVE
Glucose, UA: 100 mg/dL — AB
Hgb urine dipstick: NEGATIVE
Ketones, ur: NEGATIVE mg/dL
Leukocytes,Ua: NEGATIVE
Nitrite: NEGATIVE
Protein, ur: NEGATIVE mg/dL
Specific Gravity, Urine: 1.015 (ref 1.005–1.030)
pH: 6.5 (ref 5.0–8.0)

## 2021-07-01 LAB — RAPID URINE DRUG SCREEN, HOSP PERFORMED
Amphetamines: NOT DETECTED
Barbiturates: NOT DETECTED
Benzodiazepines: NOT DETECTED
Cocaine: POSITIVE — AB
Opiates: POSITIVE — AB
Tetrahydrocannabinol: POSITIVE — AB

## 2021-07-01 LAB — CBC
HCT: 32.6 % — ABNORMAL LOW (ref 39.0–52.0)
Hemoglobin: 11.3 g/dL — ABNORMAL LOW (ref 13.0–17.0)
MCH: 32.2 pg (ref 26.0–34.0)
MCHC: 34.7 g/dL (ref 30.0–36.0)
MCV: 92.9 fL (ref 80.0–100.0)
Platelets: 280 10*3/uL (ref 150–400)
RBC: 3.51 MIL/uL — ABNORMAL LOW (ref 4.22–5.81)
RDW: 14 % (ref 11.5–15.5)
WBC: 16.2 10*3/uL — ABNORMAL HIGH (ref 4.0–10.5)
nRBC: 0 % (ref 0.0–0.2)

## 2021-07-01 LAB — HIV ANTIBODY (ROUTINE TESTING W REFLEX): HIV Screen 4th Generation wRfx: NONREACTIVE

## 2021-07-01 MED ORDER — AMLODIPINE BESYLATE 5 MG PO TABS
5.0000 mg | ORAL_TABLET | Freq: Every day | ORAL | Status: DC
  Filled 2021-07-01: qty 1

## 2021-07-01 MED ORDER — SODIUM CHLORIDE 0.9 % IV SOLN
4.5000 g | Freq: Four times a day (QID) | INTRAVENOUS | Status: DC
  Administered 2021-07-01 – 2021-07-02 (×2): 4.5 g via INTRAVENOUS
  Filled 2021-07-01 (×3): qty 20
  Filled 2021-07-01: qty 4.5

## 2021-07-01 MED ORDER — THIAMINE HCL 100 MG PO TABS
100.0000 mg | ORAL_TABLET | Freq: Every day | ORAL | Status: DC
  Administered 2021-07-03 – 2021-07-04 (×2): 100 mg via ORAL
  Filled 2021-07-01: qty 1

## 2021-07-01 MED ORDER — IOHEXOL 9 MG/ML PO SOLN
ORAL | Status: AC
  Filled 2021-07-01: qty 500

## 2021-07-01 MED ORDER — PANTOPRAZOLE SODIUM 40 MG IV SOLR
40.0000 mg | INTRAVENOUS | Status: DC
  Administered 2021-07-01: 40 mg via INTRAVENOUS
  Filled 2021-07-01: qty 40

## 2021-07-01 MED ORDER — PHENOBARBITAL SODIUM 130 MG/ML IJ SOLN
130.0000 mg | Freq: Three times a day (TID) | INTRAMUSCULAR | Status: AC
  Administered 2021-07-01 – 2021-07-03 (×6): 130 mg via INTRAVENOUS
  Filled 2021-07-01 (×6): qty 1

## 2021-07-01 MED ORDER — DIATRIZOATE MEGLUMINE & SODIUM 66-10 % PO SOLN
ORAL | Status: AC
  Filled 2021-07-01: qty 120

## 2021-07-01 MED ORDER — PHENOBARBITAL SODIUM 65 MG/ML IJ SOLN
65.0000 mg | Freq: Three times a day (TID) | INTRAMUSCULAR | Status: DC
  Administered 2021-07-03 – 2021-07-04 (×3): 65 mg via INTRAVENOUS
  Filled 2021-07-01 (×3): qty 1

## 2021-07-01 MED ORDER — HYDROMORPHONE HCL 1 MG/ML IJ SOLN
1.0000 mg | Freq: Once | INTRAMUSCULAR | Status: AC
  Administered 2021-07-01: 1 mg via INTRAVENOUS
  Filled 2021-07-01: qty 1

## 2021-07-01 MED ORDER — IOHEXOL 300 MG/ML  SOLN
100.0000 mL | Freq: Once | INTRAMUSCULAR | Status: AC | PRN
  Administered 2021-07-01: 100 mL via ORAL

## 2021-07-01 MED ORDER — IOHEXOL 9 MG/ML PO SOLN: 500.0000 mL | ORAL | Status: AC

## 2021-07-01 MED ORDER — DEXTROSE-NACL 5-0.9 % IV SOLN: INTRAVENOUS | Status: DC

## 2021-07-01 MED ORDER — LORAZEPAM 1 MG PO TABS: 1.0000 mg | ORAL_TABLET | ORAL | Status: AC | PRN

## 2021-07-01 MED ORDER — PIPERACILLIN-TAZOBACTAM 4.5 G IVPB
4.5000 g | Freq: Four times a day (QID) | INTRAVENOUS | Status: DC
  Filled 2021-07-01 (×3): qty 100

## 2021-07-01 MED ORDER — ADULT MULTIVITAMIN W/MINERALS CH
1.0000 | ORAL_TABLET | Freq: Every day | ORAL | Status: DC
  Administered 2021-07-03 – 2021-07-07 (×4): 1 via ORAL
  Filled 2021-07-01 (×5): qty 1

## 2021-07-01 MED ORDER — DIATRIZOATE MEGLUMINE & SODIUM 66-10 % PO SOLN
100.0000 mL | Freq: Once | ORAL | Status: DC
  Filled 2021-07-01: qty 120

## 2021-07-01 MED ORDER — PHENOBARBITAL SODIUM 65 MG/ML IJ SOLN: 32.5000 mg | Freq: Three times a day (TID) | INTRAMUSCULAR | Status: DC

## 2021-07-01 MED ORDER — ACETAMINOPHEN 10 MG/ML IV SOLN
1000.0000 mg | Freq: Three times a day (TID) | INTRAVENOUS | Status: AC | PRN
  Administered 2021-07-01 – 2021-07-02 (×3): 1000 mg via INTRAVENOUS
  Filled 2021-07-01 (×3): qty 100

## 2021-07-01 MED ORDER — FOLIC ACID 1 MG PO TABS
1.0000 mg | ORAL_TABLET | Freq: Every day | ORAL | Status: DC
  Administered 2021-07-03 – 2021-07-07 (×5): 1 mg via ORAL
  Filled 2021-07-01 (×5): qty 1

## 2021-07-01 MED ORDER — THIAMINE HCL 100 MG/ML IJ SOLN
100.0000 mg | Freq: Every day | INTRAMUSCULAR | Status: DC
  Administered 2021-07-01 – 2021-07-06 (×4): 100 mg via INTRAVENOUS
  Filled 2021-07-01 (×4): qty 2

## 2021-07-01 MED ORDER — LORAZEPAM 2 MG/ML IJ SOLN
1.0000 mg | INTRAMUSCULAR | Status: AC | PRN
  Administered 2021-07-02 – 2021-07-03 (×3): 2 mg via INTRAVENOUS
  Filled 2021-07-01 (×3): qty 1

## 2021-07-01 MED ORDER — SODIUM CHLORIDE 0.9 % IV SOLN
260.0000 mg | Freq: Once | INTRAVENOUS | Status: AC
  Administered 2021-07-01: 260 mg via INTRAVENOUS
  Filled 2021-07-01: qty 2

## 2021-07-01 NOTE — ED Notes (Signed)
Pt arrived via [facility] as transfer to see Dr.Lightfoot, cardiothoracic. Reports increased pain when swallowing and swelling noted to L side of jaw

## 2021-07-01 NOTE — ED Notes (Signed)
Per Nobie Putnam, MD, no further meds for BP at this time, and call if needed for pain medication.

## 2021-07-01 NOTE — ED Notes (Signed)
[facility] at bedside to pick up patient 

## 2021-07-01 NOTE — ED Provider Notes (Signed)
Physical Exam  BP (!) 180/125    Pulse 93    Temp 98 F (36.7 C) (Oral)    Resp 12    Ht 5\' 5"  (1.651 m)    Wt 70.3 kg    SpO2 96%    BMI 25.79 kg/m   Physical Exam Vitals and nursing note reviewed.  Constitutional:      General: He is not in acute distress.    Appearance: He is well-developed. He is ill-appearing.  HENT:     Head: Normocephalic and atraumatic.  Eyes:     Conjunctiva/sclera: Conjunctivae normal.  Cardiovascular:     Rate and Rhythm: Normal rate and regular rhythm.     Heart sounds: No murmur heard. Pulmonary:     Effort: Pulmonary effort is normal. No respiratory distress.     Breath sounds: Normal breath sounds.  Abdominal:     Palpations: Abdomen is soft.     Tenderness: There is no abdominal tenderness.  Musculoskeletal:        General: No swelling.     Cervical back: Neck supple.  Skin:    General: Skin is warm and dry.     Capillary Refill: Capillary refill takes less than 2 seconds.  Neurological:     Mental Status: He is alert.  Psychiatric:        Mood and Affect: Mood normal.    Procedures  .Critical Care Performed by: Teressa Lower, MD Authorized by: Teressa Lower, MD   Critical care provider statement:    Critical care time (minutes):  30   Critical care was time spent personally by me on the following activities:  Development of treatment plan with patient or surrogate, discussions with consultants, evaluation of patient's response to treatment, examination of patient, ordering and review of laboratory studies, ordering and review of radiographic studies, ordering and performing treatments and interventions, pulse oximetry, re-evaluation of patient's condition and review of old charts  ED Course / MDM   Clinical Course as of 07/01/21 1526  Tue Jul 01, 2021  0329 Spoke with Dr. Kipp Brood.  Requesting esophagram with Gastrografin.  This was ordered. [CH]  0510 Spoke with radiology.  Unable to do study without calling in an additional  radiologist.  Can schedule for 8 AM.  Again discussed with Dr. Kipp Brood who stated "it is up to them."  He stated that he could not admit or do anything for the patient until he had the study.  Plan for esophagram at 8 AM.  Patient will hold in ED until that time.  The remains clinically stable. [CH]  0705 Concern for esopgheal tear, esophagram pending, call CTafter [MK]    Clinical Course User Index [CH] Kevin [name], Kevin Hair, MD [MK] Kevin Egerton, MD   Medical Decision Making Amount and/or Complexity of Data Reviewed Labs: ordered. Radiology: ordered.  Risk OTC drugs. Prescription drug management. Decision regarding hospitalization.   Patient received in handoff.  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  Previous provider spoke with CT surgery overnight and esophagram would not be performed until 8 AM.  Esophagram performed during my shift and was reassuringly negative.  CT surgery consulted who recommended medical admission and they will follow closely.  Critical care was then consulted who came and evaluated the patient and deemed that the patient was safe for stepdown admission.  CIWA protocol begun and patient requiring phenobarbital.  Patient then admitted to the family medicine service under stepdown.       Kevin [name], Debe Coder, MD  07/01/21 1528 ° °

## 2021-07-01 NOTE — ED Notes (Signed)
[facility] contacted 

## 2021-07-01 NOTE — ED Notes (Signed)
Not able to do ct scan due to patient not able to tolerate contrast.

## 2021-07-01 NOTE — ED Notes (Signed)
Teacher, English as a foreign language and gave report on this patient

## 2021-07-01 NOTE — Consult Note (Addendum)
[address]       [name] [address]             [phone]                    [name] [facility] Medical Record #[id number] Date of Birth: [birth year]  Referring: No ref. provider found Primary Care: [name], [name], NP Primary Cardiologist: None  Chief Complaint:    Chief Complaint  Patient presents with   Sore Throat   Cough    History of Present Illness:    Kevin [name] 51 y.o. male transferred from [facility] with concern for esophageal perforation.  The patient presented late last evening with acute onset chest pain and dyspnea after an episode of emesis and retching.  The patient states that he was drinking all day and after the last shot he experiences pain after throwing up.  He admits to some dysphagia and odynophagia.    Past Medical History:  Diagnosis Date   Acid reflux    Alcohol abuse    Anxiety    Depression    Hip pain    Hypertension     Past Surgical History:  Procedure Laterality Date   alcohol abuse     FACIAL RECONSTRUCTION SURGERY     d/t facial fx's   left leg surgery      History reviewed. No pertinent family history.   Social History   Tobacco Use  Smoking Status Every Day   Packs/day: 0.25   Types: Cigarettes  Smokeless Tobacco Never    Social History   Substance and Sexual Activity  Alcohol Use Yes   Alcohol/week: 2.0 - 3.0 standard drinks   Types: 2 - 3 Glasses of wine per week   Comment: drink wine constantly all day     No Known Allergies  Current Facility-Administered Medications  Medication Dose Route Frequency Provider Last Rate Last Admin   acetaminophen (TYLENOL) tablet 650 mg  650 mg Oral Once PRN Ernie Avena, MD       Current Outpatient Medications  Medication Sig Dispense Refill   ibuprofen (ADVIL,MOTRIN) 200 MG tablet Take 800 mg by mouth every 6 (six) hours as needed for moderate pain.      Review of Systems  Constitutional: Negative.   HENT:   Positive for sore throat.   Respiratory:  Positive for shortness of breath.   Cardiovascular:  Positive for chest pain.  Gastrointestinal:  Positive for nausea and vomiting.   PHYSICAL EXAMINATION: BP (!) 182/122    Pulse 88    Temp (!) 96.7 F (35.9 C) (Temporal)    Resp 19    Ht 5\' 5"  (1.651 m)    Wt 70.3 kg    SpO2 96%    BMI 25.79 kg/m   Physical Exam Constitutional:      General: He is not in acute distress.    Appearance: He is not ill-appearing, toxic-appearing or diaphoretic.  HENT:     Head: Normocephalic and atraumatic.  Neck:     Comments: Mild crepitus Cardiovascular:     Rate and Rhythm: Normal rate.  Pulmonary:     Effort: Pulmonary effort is normal. No respiratory distress.  Abdominal:     Palpations: Abdomen is soft.  Skin:    General: Skin is warm and dry.  Neurological:     Mental Status: He is alert.     Diagnostic Studies & Laboratory data:  Recent Radiology Findings:   CT Soft Tissue Neck W Contrast  Result Date: 06/30/2021 CLINICAL DATA:  Initial evaluation for acute sore throat. EXAM: CT NECK WITH CONTRAST TECHNIQUE: Multidetector CT imaging of the neck was performed using the standard protocol following the bolus administration of intravenous contrast. RADIATION DOSE REDUCTION: This exam was performed according to the departmental dose-optimization program which includes automated exposure control, adjustment of the mA and/or kV according to patient size and/or use of iterative reconstruction technique. CONTRAST:  75mL OMNIPAQUE IOHEXOL 300 MG/ML  SOLN COMPARISON:  Concomitant CT of the chest. FINDINGS: Pharynx and larynx: Oral cavity within normal limits. Extensive soft tissue emphysema seen tracking throughout the right greater than left retropharyngeal space, extending into the parapharyngeal and carotid spaces as well as the submandibular spaces. Emphysema seen coursing cephalad into the visualized upper mediastinum, with particular note of prominent  gas surrounding the visualized upper esophagus. Finding of uncertain etiology, but raises the possibility for possible distal esophageal perforation. No visible defect seen about the visualized aero digestive tract. Salivary glands: Salivary glands including the parotid and submandibular glands are within normal limits. Thyroid: Otherwise unremarkable. Lymph nodes: No enlarged or pathologic adenopathy seen about the neck. Vascular: Aberrant right subclavian artery noted. Normal intravascular enhancement seen throughout the neck. Limited intracranial: Unremarkable. Visualized orbits: Unremarkable. Mastoids and visualized paranasal sinuses: Chronic ethmoidal and maxillary sinusitis noted. Small right mastoid effusion noted. Middle ear cavities clear. Skeleton: No discrete or worrisome osseous lesions. Advanced multilevel cervical spondylosis. 5 mm facet mediated anterolisthesis of C7 on T1. Upper chest: Better evaluated on concomitant CT of the chest. Other: None. IMPRESSION: 1. Extensive soft tissue emphysema tracking throughout the neck and visualized mediastinum, most pronounced about the esophagus. Finding raises the possibility for esophageal perforation and/or leak. Correlation with dedicated fluoroscopic upper GI study recommended for further evaluation. No appreciable defect about the visualized upper aero digestive tract. 2. No other acute abnormality within the neck. 3. Chronic ethmoidal and maxillary sinusitis. 4. Advanced cervical spondylosis with 5 mm facet mediated anterolisthesis of C7 on T1. Electronically Signed   By: Rise MuBenjamin  McClintock M.D.   On: 06/30/2021 23:06   CT Chest Wo Contrast  Result Date: 07/01/2021 CLINICAL DATA:  Evaluate esophageal perforation seen on prior chest CT. EXAM: CT CHEST WITHOUT CONTRAST TECHNIQUE: Multidetector CT imaging of the chest was performed following the standard protocol without IV contrast. Patient was given 40 mL of oral water-soluble contrast prior to the  scan, however the patient spit out the contrast as per the CT technologist. RADIATION DOSE REDUCTION: This exam was performed according to the departmental dose-optimization program which includes automated exposure control, adjustment of the mA and/or kV according to patient size and/or use of iterative reconstruction technique. COMPARISON:  June 30, 2021 FINDINGS: Cardiovascular: No significant vascular findings. There is mild to moderate severity cardiomegaly. No pericardial effusion. Mediastinum/Nodes: Stable moderate to marked severity pneumomediastinum is seen with stable, marked severity pneumatosis noted along the length of the esophagus. No enlarged mediastinal or axillary lymph nodes are identified. The thyroid gland and trachea demonstrate no significant findings. Lungs/Pleura: Mild atelectasis is seen within the posterior aspect of the right lower lobe. There is no evidence of a pleural effusion or pneumothorax. Upper Abdomen: There is a small hiatal hernia. Musculoskeletal: Marked severity soft tissue emphysema is noted throughout the neck and is stable in appearance when compared to the prior study. No acute osseous abnormalities are identified. IMPRESSION: 1. Stable findings consistent with esophageal perforation with marked  severity pneumatosis along the length of the esophagus and moderate to marked severity pneumomediastinum. 2. Marked severity soft tissue emphysema throughout the neck. 3. Mild posterior right basilar atelectasis. 4. Small hiatal hernia. Electronically Signed   By: Aram Candela M.D.   On: 07/01/2021 01:47   CT CHEST WO CONTRAST  Result Date: 06/30/2021 CLINICAL DATA:  Shortness of breath, difficulty swallowing EXAM: CT CHEST WITHOUT CONTRAST TECHNIQUE: Multidetector CT imaging of the chest was performed following the standard protocol without IV contrast. RADIATION DOSE REDUCTION: This exam was performed according to the departmental dose-optimization program which  includes automated exposure control, adjustment of the mA and/or kV according to patient size and/or use of iterative reconstruction technique. COMPARISON:  None. FINDINGS: Cardiovascular: Normal heart size. No significant pericardial effusion. The thoracic aorta is normal in caliber. No atherosclerotic plaque of the thoracic aorta. Left anterior descending coronary artery calcifications. Mediastinum/Nodes: Moderate volume pneumomediastinum. No enlarged mediastinal or axillary lymph nodes. Thyroid gland and trachea demonstrate no significant findings. Marked pneumatosis of the esophagus. Small hiatal hernia. Lungs/Pleura: No focal consolidation. No pulmonary nodule. No pulmonary mass. No pneumothorax. Upper Abdomen: No acute abnormality. Musculoskeletal: Marked soft tissue emphysema within the neck. Please see separately dictated CT chest. No suspicious lytic or blastic osseous lesions. No acute displaced fracture. Multilevel degenerative changes of the spine. IMPRESSION: 1. Marked pneumatosis of the esophagus with associated pneumomediastinum suggestive of esophageal perforation. Markedly limited evaluation on this noncontrast study. If PO contrast administered, please use water-soluble contrast. 2. Small hiatal hernia. 3. Marked soft tissue emphysema within the neck. Please see separately dictated CT neck 06/30/2021. These results were called by telephone at the time of interpretation on 06/30/2021 at 10:12 pm to provider AMJAD ALI , who verbally acknowledged these results. Electronically Signed   By: Tish Frederickson M.D.   On: 06/30/2021 22:18   DG Chest Portable 1 View  Result Date: 06/30/2021 CLINICAL DATA:  Dyspnea EXAM: PORTABLE CHEST 1 VIEW COMPARISON:  01/18/2019 FINDINGS: Cardiomegaly. Streaky atelectasis left base. No consolidation or pleural effusion. No pneumothorax. IMPRESSION: Cardiomegaly with streaky atelectasis at the left base. Electronically Signed   By: Jasmine Pang M.D.   On: 06/30/2021  22:13       I have independently reviewed the above radiology studies  and reviewed the findings with the patient.   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  He does not have a pleural effusion or any fluid collection noted on cross-sectional imaging.  He will need formal esophagram to assess for full-thickness injury.  If he does not have a full-thickness injury he can be admitted to the medicine team and made n.p.o. until his white count comes down.  We will continue to follow.      Corliss Skains 07/01/2021 7:28 AM    No evidence of perforation on esophagram.  Unclear source for mediastinal emphysema.  Would recommend medical admission for obs, and possible GI consult for upper endoscopy if esophageal source is still suspected.  No need for surgical intervention at this point.   Watson Robarge Keane Scrape

## 2021-07-01 NOTE — ED Provider Notes (Signed)
This is a 51 year old male who presents from [facility] emergency department with concern for Boerhaave syndrome.  Patient reports that he took a shot of liquor yesterday evening.  He states that he get caught and he forcefully vomited and coughed.  Since that time he has had sensation of throat swelling and difficulty swallowing.  CT scan at [facility] concerning for mediastinal air and esophageal perforation.  Dr. Kipp Brood requested transfer.   Physical Exam  BP (!) 189/116    Pulse 84    Temp (!) 96.7 F (35.9 C) (Temporal)    Resp 19    Ht 1.651 m (5\' 5" )    Wt 70.3 kg    SpO2 90%    BMI 25.79 kg/m   Physical Exam Awake, alert, no acute distress.  Protecting his airway at this time. Does have a muffled voice Crepitus around the neck Procedures  Procedures  ED Course / MDM   Clinical Course as of 07/01/21 0532  Tue Jul 01, 2021  0329 Spoke with Dr. Kipp Brood.  Requesting esophagram with Gastrografin.  This was ordered. [CH]  0510 Spoke with radiology.  Unable to do study without calling in an additional radiologist.  Can schedule for 8 AM.  Again discussed with Dr. Kipp Brood who stated "it is up to them."  He stated that he could not admit or do anything for the patient until he had the study.  Plan for esophagram at 8 AM.  Patient will hold in ED until that time.  The remains clinically stable. [CH]    Clinical Course User Index [CH] Leomar Westberg, Barbette Hair, MD   Medical Decision Making  Patient awaiting definitive esophagram in order for CT surgery to make a decision regarding admission.       Merryl Hacker, MD 07/01/21 [phone]

## 2021-07-01 NOTE — Consult Note (Signed)
NAME:  Kevin [name], MRN:  [id number], DOB:  10-07-1970, LOS: 0 ADMISSION DATE:  06/30/2021, CONSULTATION DATE: 07/01/2018 through REFERRING MD: Emergency department, CHIEF COMPLAINT: Chest pain  History of Present Illness:  Everyday drinker somewhat vague about the amount drinks.  He vomited after doing shots of alcohol.  An extensive acute chest pain.  Evaluated by CT of the chest which demonstrated air around the esophagus.  Transferred to [facility] for esophagram which did not demonstrate any perforations..  Pulmonary critical care asked to evaluate.  Currently awake alert no acute distress slightly hypertensive.  Pulmonary critical care will be available as needed  Pertinent  Medical History   Past Medical History:  Diagnosis Date   Acid reflux    Alcohol abuse    Anxiety    Depression    Hip pain    Hypertension      Significant Hospital Events: Including procedures, antibiotic start and stop dates in addition to other pertinent events   Area around the esophagus  Interim History / Subjective:  Currently emergency department with no acute distress hemodynamically stable  Objective   Blood pressure (!) 191/125, pulse 90, temperature 98 F (36.7 C), temperature source Oral, resp. rate 17, height 5\' 5"  (1.651 m), weight 70.3 kg, SpO2 97 %.        Intake/Output Summary (Last 24 hours) at 07/01/2021 1035 Last data filed at 07/01/2021 0255 Gross per 24 hour  Intake 1700.12 ml  Output --  Net 1700.12 ml   Filed Weights   06/30/21 2259  Weight: 70.3 kg    Examination: General: Disheveled male no acute distress at rest HENT: Noted having bilateral submandibular swelling Lungs: Decreased breath sounds throughout Cardiovascular: Heart sounds are regular Abdomen: Slightly distended tender to palpation Extremities: No edema Neuro: Grossly intact without focal deep GU: Amber urine  Resolved Hospital Problem list     Assessment & Plan:   Diagnosis of  air around esophagus following acute episode of vomiting and doing shots of tequila.  Esophagram did not show any obvious perforations he was transferred from [facility] to [facility] for CVTS evaluation and pulmonary critical care asked to evaluate in the emergency room. No acute distress at rest Awake alert follows commands Abdomen is tender He has been evaluated by CVTS Pulmonary critical care available as needed N.p.o. for now  Chronic alcohol abuse with last drink approximately 12 hours ago. CIWA protocol He will need IV medications due to his air around the esophagus Close observation He reports having DTs on a daily basis but alcohol tends to push it back  Hypertension Antihypertensives per primary   Best Practice (right click and "Reselect all SmartList Selections" daily)   Diet/type: NPO DVT prophylaxis: not indicated GI prophylaxis: N/A Lines: N/A Foley:  N/A Code Status:  full code Last date of multidisciplinary goals of care discussion [tbd]  Labs   CBC: Recent Labs  Lab 06/30/21 2101  WBC 17.0*  NEUTROABS 14.9*  HGB 12.7*  HCT 37.0*  MCV 93.9  PLT 123XX123    Basic Metabolic Panel: Recent Labs  Lab 06/30/21 2101  NA 133*  K 3.1*  CL 97*  CO2 24  GLUCOSE 126*  BUN 10  CREATININE 0.93  CALCIUM 8.2*   GFR: Estimated Creatinine Clearance: 80.8 mL/min (by C-G formula based on SCr of 0.93 mg/dL). Recent Labs  Lab 06/30/21 2101  WBC 17.0*    Liver Function Tests: No results for input(s): AST, ALT, ALKPHOS,  BILITOT, PROT, ALBUMIN in the last 168 hours. No results for input(s): LIPASE, AMYLASE in the last 168 hours. No results for input(s): AMMONIA in the last 168 hours.  ABG    Component Value Date/Time   TCO2 26 09/12/2011 2203     Coagulation Profile: No results for input(s): INR, PROTIME in the last 168 hours.  Cardiac Enzymes: No results for input(s): CKTOTAL, CKMB, CKMBINDEX, TROPONINI in the last 168  hours.  HbA1C: No results found for: HGBA1C  CBG: No results for input(s): GLUCAP in the last 168 hours.  Review of Systems:   10 point review of system taken, please see HPI for positives and negatives.   Past Medical History:  He,  has a past medical history of Acid reflux, Alcohol abuse, Anxiety, Depression, Hip pain, and Hypertension.   Surgical History:   Past Surgical History:  Procedure Laterality Date   alcohol abuse     FACIAL RECONSTRUCTION SURGERY     d/t facial fx's   left leg surgery       Social History:   reports that he has been smoking cigarettes. He has been smoking an average of .25 packs per day. He has never used smokeless tobacco. He reports current alcohol use of about 2.0 - 3.0 standard drinks per week. He reports current drug use. Drugs: Marijuana and Cocaine.   Family History:  His family history is not on file.   Allergies No Known Allergies   Home Medications  Prior to Admission medications   Medication Sig Start Date End Date Taking? Authorizing Provider  ibuprofen (ADVIL,MOTRIN) 200 MG tablet Take 800 mg by mouth every 6 (six) hours as needed for moderate pain.   Yes [provider]  omeprazole (PRILOSEC) 20 MG capsule Take 20 mg by mouth daily.   09/06/11  [provider]     Critical care time: 34 min    Richardson Landry Seith Aikey ACNP Acute Care Nurse Practitioner [place] Please consult Amion 07/01/2021, 10:35 AM

## 2021-07-01 NOTE — H&P (Signed)
[facility] Admission History and Physical Service Pager: [phone]  Patient name: Kevin [name] Medical record number: [id number] Date of birth: 11/22/1970 Age: 51 y.o. Gender: male  Primary Care Provider: Marliss Coots, NP Consultants: PCCM, cardiothoracic surgery Code Status: Full  Preferred Emergency Contact: Arrie Aran (friend): [phone]  Chief Complaint: Difficulty Swallowing  Assessment and Plan: Kevin [name] is a 51 y.o. male presenting with difficulty swallowing found to have significant pneumomediastinum and was transferred from [facility]. PMH is significant for alcohol abuse, cocaine use, marijuana use, anxiety/depression, HTN, hx of gastritis.  Pneumomediastinum Patient being admitted for pneumomediastinum with concern for esophageal perforation.  Yesterday patient went to [facility] long ED with muffled voice, globus sensation, difficulty swallowing, swelling of his neck and said he had hematemesis after drinking a bottle of liquor yesterday.  In [facility] 1/16 noncontrast CT of the neck was performed which showed marked pneumatosis of the esophagus was associated to most mediastinum suggestive of esophageal perforation along with marked soft tissue emphysema within the neck.  CT with contrast 1/16 showed extensive soft tissue emphysema tracking throughout the neck and visualized mediastinum most pronounced around the esophagus. No appreciable defect visualized on CT scan. In ED 1/16 was given Benadryl injection, EpiPenx2, fentanyl x1 Dilaudid x1, Solu-Medrol injection, Zofran x2 given concern for anaphylaxis.  Given broad-spectrum antibiotics with ceftriaxone, Zosyn, and vancomycin.  Did have 1 L bolus yesterday. After this CT scan Dr. Kipp Brood with cardiothoracic surgery requested transfer to [facility].  Patient has remained afebrile, was initially tachycardic but has since had normal heart rate, saturating in high 90s on room air, and has had significant  hypertension discussed in problems below. CT chest without contrast today 1/17 showed stable findings consistent with esophageal perforation with marked severity pneumatosis along the length of the esophagus and moderate to marked severity pneumomediastinum.  Showed marked severity soft tissue emphysema throughout the neck as well as mild posterior right basilar atelectasis. Esophagogram performed today 1/17 in [place] showed no evidence of esophageal perforation or mucosal irregularity and mild esophageal dysmotility with a small hiatal hernia with mild GERD.  Labs in the ED yesterday showed hypokalemia, hypochloremia, elevated white count to 17 and hemoglobin of 12.7 with neutrophil predominance.  Negative respiratory panel.  CT surgery saw patient and recommended if no full-thickness injury on esophagram can remain n.p.o. until his white count comes down but will continue to follow as there is no surgical intervention from their standpoint currently. Critical care was consulted as well and recommended remaining n.p.o. until injury is fully recovers and will follow as well. On examination patient was lying comfortably on bed in no acute dyspnea, did have muffled voice and significant neck swelling with submandibular gland enlargement. Lung examination showed patient saturating well on room air and clear to auscultation in the upper airways. There is an unclear source of mediastinal emphysema. Although esophagram did not show full-thickness injury, Booerhaave syndrome or Mallory Weiss tear on differential given patient's chronic alcohol use history as well as daily vomiting episodes and acute onset of symptoms after liquor ingestion yesterday. Patient also had history of cocaine use yesterday morning, can consider cocaine associated pneumomediastinum such as barotrauma during insufflation process given patient had some atelectasis on imaging.  Spontaneous pneumomediastinum possibility given patient has  predisposing factors of tobacco use and recreational drug use.  Can consider trauma/injury as well given patient says that he falls a lot due to being intoxicated every day.   -Admit to FPTS,  progressive, attending Dr. McDiarmid -CT surgery following, appreciate recommendations -PCCM following, appreciate recommendations -Consider GI consult for possible esophageal injury -Consider follow-up chest CT with oral contrast in 2-3 days given the esophageal pneumatosis and pneumomediastinum on CT -f/u CBC (monitor Hgb), CMP today -Cardiac monitoring -Continuous pulse ox -Oxygenation as needed, keep O2 sats greater than 92% -NPO -IV Zosyn 4.5 g q6h for 7 days ([place] protocol) -IV Protonix 40 mg q24h -D5NS mIVF given patient NPO -SCDs -EKG -AM CBC, CMP -Consider RD consult if TPN needed  Neck swelling   submandibular swelling Patient has significant swelling of his neck (pictures in media tab). Also has significant submandibular swelling on examination.  On imaging there is marked severity soft tissue emphysema throughout the neck. Non-tender to palpation. Parotitis possible given patient has been having daily episodes of emesis due to his alcohol use and bilateral submandibular swelling. Differential can include abscess given patient had WBC count to 17 but has remained afebrile and no fluid pocket seen on imaging.  -Monitor fever curve -Monitor respiratory status  Alcohol abuse Patient has had multiple ED visits and issues with withdrawal before. Says he drinks about 5 bottles of wine, 2 shots of liquor and 1 can of beer a day.  Last drink was yesterday 1/16 and 6 PM.  Says that he usually starts his day feeling "shaky" and then drinks to help with this. Has been in ICU before for this per patient.  CCM evaluated patient and started phenobarbital taper with CIWA Ativan on top.  Scleral icterus present on examination as well as distended abdomen. -CIWA protocol  -Phenobarbital taper -Seizure  precautions -Thiamine, folate, multivitamin -TOC consult  Hypertension Pressures have ranged 150-224/90-194.  Says he has used Lasix in the past for lower extremity swelling but denies any known history of heart failure.  Denies taking any antihypertensive medication although he was prescribed in the past given fear of interaction with alcohol.  Denies any headaches or vision changes.  Has had some shortness of breath likely secondary to problem 1. -start amlodipine 5 mg daily -monitor BP  History of gastritis Has been prescribed medication for this in the past but does not take anything for this currently given patient is fearful of interaction with alcohol. -IV Protonix 40 mg -Consider GI consult  Hematochezia  Urinary frequency/hematuria says that he intermittently has dark red bloody bowel movements intermittently for the past couple of years.  Denies any colonoscopy or scopes before.  No abdominal pain on palpation, but patient is distended although no fluid wave. -Monitor hemoglobin on CBC -Consider GI consult  Urinary frequency   hematuria Says that he has been having increased urinary frequency denies any dysuria but has had hematuria more recently.  No suprapubic pain on palpation -UA -Urine culture  Polysubstance abuse Attests to cocaine use, last used yesterday morning.  Denies any IV drug use or other substances currently. -UDS -TOC consult  Anxiety/depression Does not take any medications for this. -Monitor  FEN/GI: NPO Prophylaxis: SCDs  Disposition: Progressive  History of Present Illness:  Kevin [name] is a 51 y.o. male presenting with muffled voice, globus sensation, difficulty swallowing, swelling of his neck and said he had hematemesis after drinking a bottle of liquor yesterday  States he drinks alcohol almost every day. Yesterday morning started drinking wine and beer, and then about 5pm was guzzling liquor, felt like it was burning and then started  choking and threw up his drink. Thought something was wrong with the liquor.  States this has never happened before. States he was throwing up blood. Feels like something is suck in there. States he couldn't talk, and endorses currently his voice sounds different. Denies vomiting prior to drinking. States EMS took him to [facility].   Denies headache, vision changes. Has sore throat, some neck pain on movement. States he could have fallen while he was drink as he did recently have rib pain. Denies chest pain, abdominal pain, constipation, diarrhea. Does endorse dark red bloody bowel movements that comes and goes. Does endorse lower extremity swelling and was previously taking lasix. Was told he has a heart murmur  States he drinks about 5 bottles of wine a day, and 2 shots of liquor, 1 can of beer. States he is an alcoholic. Endorses cocaine use, last smoked it yesterday morning. Does smoke tobacco about 2 cigarettes a month for the past 3 years,.    Denies taking any medication. States he was told he has high blood pressure but doesn't take medication because he drinks.States father passed away from taking medication and drinking at the same time.   Currently stays at the laundry mat, states he has a bed there with his friend Dawn, but leaves at 5am because that's when people come.   Review Of Systems: Per HPI with the following additions:   Review of Systems  Constitutional:  Negative for fever.  HENT:  Positive for sore throat, trouble swallowing and voice change.   Eyes:        Scleral icterus  Respiratory:  Positive for choking and shortness of breath.   Gastrointestinal:  Positive for abdominal distention and vomiting. Negative for constipation and diarrhea.  Genitourinary:  Positive for frequency and hematuria.  Neurological:  Negative for headaches.  Hematological:  Positive for adenopathy.  Psychiatric/Behavioral:  Negative for agitation.     Patient Active Problem List   Diagnosis  Date Noted   Alcohol dependence (HCC) 02/19/2013   Major depression, recurrent (HCC) 02/19/2013    Past Medical History: Past Medical History:  Diagnosis Date   Acid reflux    Alcohol abuse    Anxiety    Depression    Hip pain    Hypertension     Past Surgical History: Past Surgical History:  Procedure Laterality Date   alcohol abuse     FACIAL RECONSTRUCTION SURGERY     d/t facial fx's   left leg surgery      Social History: Social History   Tobacco Use   Smoking status: Every Day    Packs/day: 0.25    Types: Cigarettes   Smokeless tobacco: Never  Substance Use Topics   Alcohol use: Yes    Alcohol/week: 2.0 - 3.0 standard drinks    Types: 2 - 3 Glasses of wine per week    Comment: drink wine constantly all day   Drug use: Yes    Types: Marijuana, Cocaine   Additional social history:  Currently stays at the laundry mat, states he has a bed there with his friend Dawn, but leaves at 5am because that's when people come.   Please also refer to relevant sections of EMR.  Family History: History reviewed. No pertinent family history. Father also had alcohol use and hypertension and passed away from this  Allergies and Medications: No Known Allergies No current facility-administered medications on file prior to encounter.   Current Outpatient Medications on File Prior to Encounter  Medication Sig Dispense Refill   ibuprofen (ADVIL,MOTRIN) 200 MG tablet Take 800  mg by mouth every 6 (six) hours as needed for moderate pain.     [DISCONTINUED] omeprazole (PRILOSEC) 20 MG capsule Take 20 mg by mouth daily.       Objective: BP (!) 160/107    Pulse 79    Temp 98 F (36.7 C) (Oral)    Resp 14    Ht 5\' 5"  (1.651 m)    Wt 70.3 kg    SpO2 95%    BMI 25.79 kg/m  Exam: General: NAD, laying comfortably in bed, able to answer questions, alert and oriented x4 Eyes: Scleral icterus present, EOMI, pupils equal and reactive ENTM: slightly dry mucous membranes, tongue  obstructing view of entire oral pharynx/uvula Neck: Significant swelling, no crepitus palpated on my examination, significant submandibular node enlargement bilaterally Cardiovascular: RRR no murmurs rubs or gallops, 2+ pulses bilaterally Respiratory: Clear to auscultation bilaterally in upper lobes, mild decreased breath sounds in lower lung fields, no retractions, no increased work of breathing, speaking full sentences Gastrointestinal: Distended abdomen, nontender to palpation, no fluid wave present MSK: Trace nonpitting edema in lower extremity Derm: No rashes or lesions visualized Neuro: CN II: PERRL CN III, IV,VI: EOMI CV V: Normal sensation in V1, V2, V3 CVII: Symmetric brow raise CN VIII: Normal hearing CN IX,X: Symmetric palate raise  CN XI: 5/5 shoulder shrug CN XII: Symmetric tongue protrusion  UE and LE strength 5/5 Normal sensation in UE and LE bilaterally  Psych: Mood appropriate, pleasant  Labs and Imaging: CBC BMET  Recent Labs  Lab 06/30/21 2101  WBC 17.0*  HGB 12.7*  HCT 37.0*  PLT 316   Recent Labs  Lab 06/30/21 2101  NA 133*  K 3.1*  CL 97*  CO2 24  BUN 10  CREATININE 0.93  GLUCOSE 126*  CALCIUM 8.2*     EKG: ordered  Gerrit Heck, MD 07/01/2021, 1:39 PM PGY-1, [place] [facility] pager: [phone], text pages welcome

## 2021-07-01 NOTE — Progress Notes (Signed)
FPTS Brief Progress Note  S: Patient resting comfortably in bed on evaluation.  Reports that he is feeling okay although he still feels pain when he swallows.  It is not getting any worse than prior to admission and he feels it is greatly improved from when he arrived in the emergency department.  Reports that he has had issues with withdrawal in the past but has been 10 years since he was hospitalized and intubated for withdrawals.  He reports he drinks alcohol regularly which is why he does not take any blood pressure medications.  Denies any pain other than the pain he feels when he swallows.   O: BP (!) 167/116    Pulse 72    Temp 98.5 F (36.9 C) (Oral)    Resp 20    Ht 5\' 5"  (1.651 m)    Wt 69.8 kg    SpO2 100%    BMI 25.61 kg/m   General: Pleasant 51 year old male, laying comfortably in bed \ HEENT: Patient with swelling of his neck, raspiness in his voice Respiratory: Normal work of breathing Cardiac: Regular rate   A/P: Pneumomediastinum Patient currently stable at this time.  We will continue with current DVT management plan.  Patient did not have any as needed medications ordered and is n.p.o. at this time due to the esophageal perforation.  Order placed for IV Tylenol every 8 as needed.  Alcohol abuse CIWA's 2 at last check.  Continuing CIWA protocol.  Patient currently on phenobarbital taper and has Ativan ordered per CIWA protocol.  Continue DT management.  - Orders reviewed. Labs for AM ordered, which was adjusted as needed.  - If condition changes, plan includes this patient becomes hemodynamically unstable or unable to control CIWA's plan to discuss with CCM.   44, MD 07/01/2021, 9:41 PM PGY-3, [facility] Night Resident  Please page [phone] with questions.

## 2021-07-02 DIAGNOSIS — F1990 Other psychoactive substance use, unspecified, uncomplicated: Secondary | ICD-10-CM | POA: Diagnosis not present

## 2021-07-02 DIAGNOSIS — F10229 Alcohol dependence with intoxication, unspecified: Secondary | ICD-10-CM

## 2021-07-02 DIAGNOSIS — K223 Perforation of esophagus: Secondary | ICD-10-CM

## 2021-07-02 DIAGNOSIS — J982 Interstitial emphysema: Secondary | ICD-10-CM | POA: Diagnosis not present

## 2021-07-02 DIAGNOSIS — K920 Hematemesis: Secondary | ICD-10-CM

## 2021-07-02 DIAGNOSIS — K226 Gastro-esophageal laceration-hemorrhage syndrome: Secondary | ICD-10-CM

## 2021-07-02 LAB — COMPREHENSIVE METABOLIC PANEL
ALT: 36 U/L (ref 0–44)
AST: 46 U/L — ABNORMAL HIGH (ref 15–41)
Albumin: 2.5 g/dL — ABNORMAL LOW (ref 3.5–5.0)
Alkaline Phosphatase: 83 U/L (ref 38–126)
Anion gap: 9 (ref 5–15)
BUN: 19 mg/dL (ref 6–20)
CO2: 21 mmol/L — ABNORMAL LOW (ref 22–32)
Calcium: 7.7 mg/dL — ABNORMAL LOW (ref 8.9–10.3)
Chloride: 104 mmol/L (ref 98–111)
Creatinine, Ser: 1.07 mg/dL (ref 0.61–1.24)
GFR, Estimated: >60 mL/min (ref >60)
Glucose, Bld: 106 mg/dL — ABNORMAL HIGH (ref 70–99)
Potassium: 3.5 mmol/L (ref 3.5–5.1)
Sodium: 134 mmol/L — ABNORMAL LOW (ref 135–145)
Total Bilirubin: 1 mg/dL (ref 0.3–1.2)
Total Protein: 6.8 g/dL (ref 6.5–8.1)

## 2021-07-02 LAB — CBC
HCT: 32.4 % — ABNORMAL LOW (ref 39.0–52.0)
Hemoglobin: 11.1 g/dL — ABNORMAL LOW (ref 13.0–17.0)
MCH: 32.3 pg (ref 26.0–34.0)
MCHC: 34.3 g/dL (ref 30.0–36.0)
MCV: 94.2 fL (ref 80.0–100.0)
Platelets: 257 10*3/uL (ref 150–400)
RBC: 3.44 MIL/uL — ABNORMAL LOW (ref 4.22–5.81)
RDW: 13.9 % (ref 11.5–15.5)
WBC: 14.9 10*3/uL — ABNORMAL HIGH (ref 4.0–10.5)
nRBC: 0 % (ref 0.0–0.2)

## 2021-07-02 LAB — URINE CULTURE: Culture: NO GROWTH

## 2021-07-02 MED ORDER — CLONIDINE HCL 0.2 MG/24HR TD PTWK
0.2000 mg | MEDICATED_PATCH | TRANSDERMAL | Status: DC
  Administered 2021-07-02: 0.2 mg via TRANSDERMAL
  Filled 2021-07-02: qty 1

## 2021-07-02 MED ORDER — HYDROMORPHONE HCL 1 MG/ML IJ SOLN
1.0000 mg | Freq: Once | INTRAMUSCULAR | Status: AC
  Administered 2021-07-02: 1 mg via INTRAVENOUS
  Filled 2021-07-02: qty 1

## 2021-07-02 MED ORDER — PANTOPRAZOLE SODIUM 40 MG IV SOLR
40.0000 mg | Freq: Two times a day (BID) | INTRAVENOUS | Status: DC
  Administered 2021-07-02 – 2021-07-04 (×3): 40 mg via INTRAVENOUS
  Filled 2021-07-02 (×4): qty 40

## 2021-07-02 MED ORDER — PIPERACILLIN-TAZOBACTAM 3.375 G IVPB
3.3750 g | Freq: Three times a day (TID) | INTRAVENOUS | Status: DC
  Administered 2021-07-02 – 2021-07-04 (×6): 3.375 g via INTRAVENOUS
  Filled 2021-07-02 (×7): qty 50

## 2021-07-02 MED ORDER — SUCRALFATE 1 GM/10ML PO SUSP
1.0000 g | Freq: Three times a day (TID) | ORAL | Status: DC
  Administered 2021-07-02 – 2021-07-07 (×18): 1 g via ORAL
  Filled 2021-07-02 (×17): qty 10

## 2021-07-02 MED ORDER — ONDANSETRON HCL 4 MG/2ML IJ SOLN
4.0000 mg | Freq: Four times a day (QID) | INTRAMUSCULAR | Status: DC | PRN
  Administered 2021-07-02: 4 mg via INTRAVENOUS
  Filled 2021-07-02: qty 2

## 2021-07-02 NOTE — Progress Notes (Signed)
Pt has been drinking water/taking ice chips without complications x 2hrs.  As per GI orders, advancing to clear liquids. Will continue to monitor.

## 2021-07-02 NOTE — Hospital Course (Addendum)
Pneumomediastinum 2/2 to Presumed Esophageal Tear Patient being admitted for pneumomediastinum with concern for esophageal perforation. Patient had muffled voice, globus sensation, difficulty swallowing, swelling of his neck and hematemesis after drinking a bottle of liquor before admission.  CT with contrast showed extensive soft tissue emphysema tracking throughout the neck and visualized mediastinum most pronounced around the esophagus. Esophagogram showed no evidence of esophageal perforation or mucosal irregularity and mild esophageal dysmotility with a small hiatal hernia with mild GERD. CT surgery saw patient and recommended no surgery as it was not a  full-thickness injury on esophagram. Critical care was consulted as well and recommended patient remaining n.p.o. and was started on dextrose containing fluids. GI was consulted and recommended carafate, pantoprazole IV and zofran to prevent vomiting. GI did not recommend EGD given risk of esophageal perforation/full thickness tear. Repeat CT at end of hospitalization showed almost complete clearing of pneumomediastinum. Few scattered small pockets of air in mediastinum and R side of neck. No evidence of significant air in esophageal wall . His diet was advanced per GI and by end of hospitalization was tolerating a regular diet well.   Neck swelling   submandibular swelling Likely secondary to parotitis from alcohol use. No abscess seen on CT/examinations.   Alcohol Withdrawal   Transaminitis Started on CIWA protocol and basal phenobarbital taper. Has history of severe withdrawals and PCCM started patient on basal phenobarbital taper in ED. Pateint remained stable on the floor. Liver ultrasound obtained due to transaminitis showed heterogenous echogenic liver consistent with hepatic steatosis and hepatocellular disease. Did not have abdominal pain during hospitalization. AST/ALT at end of discharge was 249/219. Most likely 2/2 to alcohol use. Possible  component of augmentin/abx worsening these numbers in hospital but ID pharmacist said less likely due to this as this would more likely be weeks after the antibiotics and is rare.  Hypertension Does not take any antihypertensive at home. Started on clonidine patch as well as Losartan and Amlodipine.   Polysubstance abuse   Social determinants of health Attests to cocaine use. UDS positive for opiates, cocaine, THC. TOC was consulted for substance abuse resources. Patient also is housing insecure and currently living in a laundromat.

## 2021-07-02 NOTE — Progress Notes (Signed)
Pt BP elevated 174/116, only med on MAR is amlodipine po.  Fam Med contacted, pt may not take anything po at this time.  They will be discussing and placing new orders for IV med.  Also notified that pt is complaining of pain s/p IV tylenol at 0600.  No other PRNs on MAR, fam med team to discuss and follow up.

## 2021-07-02 NOTE — Consult Note (Addendum)
Consultation  Referring Provider:     Dr. Sherren Mocha McDiarmid Primary Care Physician:  Synthia Innocent Audrea Muscat, NP Primary Gastroenterologist:   Althia Forts      Reason for Consultation:  esophageal perforation       Impression    Pneumomediastinum [facility]) in setting of significant alcohol and cocaine use after episode of hematemesis, esophagram 01/17 with water soluble and barium showed no evidence of esophageal perforation or mucosal irregularity and mild esophageal dysmotility with a small hiatal hernia with mild GERD, did show Extrinsic impression on the proximal thoracic esophagus by an aberrant right subclavian artery is noted.  Polysubstance use disorder Possibly contributing to prior  Alcohol dependence ([place])  On CIWA protocol  Elevated LFTs most likely secondary to ETOH  AST 46 (54) ALT 36 Platelets 257 Alkphos 83 TBili 1.0 (1.4) No thrombocytopenia  Hypertension  Leukocytosis WBC 14.9 HGB 11.1 MCV 94.2 Platelets 257 Down trending On zosyn  Normocytic anemia Baseline appears to be around 10-11 At 11.1    Plan   -No role for upper endoscopy at this time, barium swallow without any evidence of perforation, and EGD with insufflation could potentially worsen any small tear. - Diet advanced to ice chips at this time and then to clear liquids later today since no signs of perforation on barium esophagram. -Zofran as needed, avoid vomiting -Protonix twice daily, carafate suspension q 4 -Can do repeat CT scan 1 to 2 days to evaluate further if needed -Alcohol Abstinence counseling discussed with patient -Monitor for withdrawal symptoms while inpatient -Empiric treatment with folic acid, multivitamin and thiamine.  Further recommendations per Dr. Silverio Decamp. Thank you for your kind consultation, we will continue to follow.          HPI:   Kevin [name] is a 51 y.o. male with history of hypertension, depression/anxiety, reflux, history of polysubstance use disorder  including alcohol, marijuana, cocaine. Patient's had multiple admissions in 2020 and 2019, history of DTs in the past.  Patient was seen in the [place] 01/16 with muffled voice, difficulty swallowing, swelling of neck and hematemesis after drinking a bottle of liquor   CT with contrast 1/16 showed extensive soft tissue emphysema tracking throughout the neck and visualized mediastinum most pronounced around the esophagus.  CT chest without contrast 1/17 showed stable findings consistent with esophageal perforation with marked severity pneumatosis along the length of the esophagus and moderate to marked severity pneumomediastinum.  Showed marked severity soft tissue emphysema throughout the neck as well as mild posterior right basilar atelectasis.  Esophagogram 1/17 showed no evidence of esophageal perforation or mucosal irregularity and mild esophageal dysmotility with a small hiatal hernia with mild GERD  Patient had drank a bottle of liquor, smoked cocaine, had several episodes of hematemesis then developed globulus sensation sore throat and muffled voice.  Has not had prior to this.  Does have history of reflux. Patient continues to complain of upper pharyngeal odynophagia, globulus sensation. Patient has longstanding history of alcohol and cocaine use. Has never had endoscopy evaluation prior.  Previous GI work up: Esophagogram 1/17  1. No evidence of esophageal perforation or mucosal irregularity. Consider follow-up chest CT with oral contrast in 2-3 days given the esophageal pneumatosis and pneumomediastinum on CT. 2. Mild esophageal dysmotility with a small hiatal hernia and mild gastroesophageal reflux.    Past Medical History:  Diagnosis Date   Acid reflux    Alcohol abuse    Anxiety    Depression  Hip pain    Hypertension    Polysubstance use disorder 07/01/2021    Surgical History:  He  has a past surgical history that includes alcohol abuse; left leg surgery; and  Facial reconstruction surgery. Family History:  His family history is not on file. Social History:   reports that he has been smoking cigarettes. He has been smoking an average of .25 packs per day. He has never used smokeless tobacco. He reports current alcohol use of about 2.0 - 3.0 standard drinks per week. He reports current drug use. Drugs: Marijuana and Cocaine.  Prior to Admission medications   Medication Sig Start Date End Date Taking? Authorizing Provider  ibuprofen (ADVIL,MOTRIN) 200 MG tablet Take 800 mg by mouth every 6 (six) hours as needed for moderate pain.   Yes [provider]  omeprazole (PRILOSEC) 20 MG capsule Take 20 mg by mouth daily.   09/06/11  [provider]    Current Facility-Administered Medications  Medication Dose Route Frequency Provider Last Rate Last Admin   acetaminophen (OFIRMEV) IV 1,000 mg  1,000 mg Intravenous Q8H PRN Gifford Shave, MD 400 mL/hr at 07/02/21 0556 1,000 mg at 07/02/21 0556   acetaminophen (TYLENOL) tablet 650 mg  650 mg Oral Once PRN Regan Lemming, MD       cloNIDine (CATAPRES - Dosed in mg/24 hr) patch 0.2 mg  0.2 mg Transdermal Weekly Paige, Victoria J, DO   0.2 mg at 07/02/21 1221   dextrose 5 %-0.9 % sodium chloride infusion   Intravenous Continuous Gerrit Heck, MD 110 mL/hr at 07/02/21 0617 New Bag at [id number]   folic acid (FOLVITE) tablet 1 mg  1 mg Oral Daily Gerrit Heck, MD       LORazepam (ATIVAN) tablet 1-4 mg  1-4 mg Oral Q1H PRN Gerrit Heck, MD       Or   LORazepam (ATIVAN) injection 1-4 mg  1-4 mg Intravenous Q1H PRN Gerrit Heck, MD       multivitamin with minerals tablet 1 tablet  1 tablet Oral Daily Gerrit Heck, MD       pantoprazole (PROTONIX) injection 40 mg  40 mg Intravenous Q24H Gerrit Heck, MD   40 mg at 07/01/21 1853   PHENObarbital (LUMINAL) injection 130 mg  130 mg Intravenous TID Gerrit Heck, MD   130 mg at 07/02/21 1024   Followed by   Derrill Memo ON  07/03/2021] PHENObarbital (LUMINAL) injection 65 mg  65 mg Intravenous TID Gerrit Heck, MD       Followed by   Derrill Memo ON 07/05/2021] PHENObarbital (LUMINAL) injection 32.5 mg  32.5 mg Intravenous TID Gerrit Heck, MD       piperacillin-tazobactam (ZOSYN) IVPB 3.375 g  3.375 g Intravenous Q8H Gifford Shave, MD 12.5 mL/hr at 07/02/21 0625 3.375 g at 07/02/21 [id number]   thiamine tablet 100 mg  100 mg Oral Daily Gerrit Heck, MD       Or   thiamine (B-1) injection 100 mg  100 mg Intravenous Daily Gerrit Heck, MD   100 mg at 07/01/21 1527    Allergies as of 06/30/2021   (No Known Allergies)    Review of Systems:    Constitutional: No weight loss, fever, chills, weakness or fatigue HEENT: Eyes: No change in vision               Ears, Nose, Throat:  No change in hearing or congestion Skin: No rash or itching Cardiovascular: No chest pain, chest pressure or palpitations   Respiratory:  No SOB or cough Gastrointestinal: See HPI and otherwise negative Genitourinary: No dysuria or change in urinary frequency Neurological: No headache, dizziness or syncope Musculoskeletal: No new muscle or joint pain Hematologic: No bleeding or bruising Psychiatric: No history of depression or anxiety     Physical Exam:  Vital signs in last 24 hours: Temp:  [98.4 F (36.9 C)-99.1 F (37.3 C)] 98.4 F (36.9 C) (01/18 0903) Pulse Rate:  [70-93] 70 (01/18 0903) Resp:  [12-22] 15 (01/18 0903) BP: (145-185)/(92-125) 174/116 (01/18 0903) SpO2:  [93 %-100 %] 100 % (01/18 0903) Weight:  [69.8 kg] 69.8 kg (01/17 2117) Last BM Date: 07/02/21  Per Dr. Silverio Decamp  LAB RESULTS: Recent Labs    06/30/21 2101 07/01/21 2211 07/02/21 0229  WBC 17.0* 16.2* 14.9*  HGB 12.7* 11.3* 11.1*  HCT 37.0* 32.6* 32.4*  PLT 316 280 257   BMET Recent Labs    06/30/21 2101 07/01/21 2211 07/02/21 0229  NA 133* 133* 134*  K 3.1* 3.6 3.5  CL 97* 101 104  CO2 24 24 21*  GLUCOSE 126* 112* 106*  BUN 10 18  19   CREATININE 0.93 0.97 1.07  CALCIUM 8.2* 7.7* 7.7*   LFT Recent Labs    07/02/21 0229  PROT 6.8  ALBUMIN 2.5*  AST 46*  ALT 36  ALKPHOS 83  BILITOT 1.0   PT/INR No results for input(s): LABPROT, INR in the last 72 hours.  STUDIES: CT Soft Tissue Neck W Contrast  Result Date: 06/30/2021 CLINICAL DATA:  Initial evaluation for acute sore throat. EXAM: CT NECK WITH CONTRAST TECHNIQUE: Multidetector CT imaging of the neck was performed using the standard protocol following the bolus administration of intravenous contrast. RADIATION DOSE REDUCTION: This exam was performed according to the departmental dose-optimization program which includes automated exposure control, adjustment of the mA and/or kV according to patient size and/or use of iterative reconstruction technique. CONTRAST:  13mL OMNIPAQUE IOHEXOL 300 MG/ML  SOLN COMPARISON:  Concomitant CT of the chest. FINDINGS: Pharynx and larynx: Oral cavity within normal limits. Extensive soft tissue emphysema seen tracking throughout the right greater than left retropharyngeal space, extending into the parapharyngeal and carotid spaces as well as the submandibular spaces. Emphysema seen coursing cephalad into the visualized upper mediastinum, with particular note of prominent gas surrounding the visualized upper esophagus. Finding of uncertain etiology, but raises the possibility for possible distal esophageal perforation. No visible defect seen about the visualized aero digestive tract. Salivary glands: Salivary glands including the parotid and submandibular glands are within normal limits. Thyroid: Otherwise unremarkable. Lymph nodes: No enlarged or pathologic adenopathy seen about the neck. Vascular: Aberrant right subclavian artery noted. Normal intravascular enhancement seen throughout the neck. Limited intracranial: Unremarkable. Visualized orbits: Unremarkable. Mastoids and visualized paranasal sinuses: Chronic ethmoidal and maxillary  sinusitis noted. Small right mastoid effusion noted. Middle ear cavities clear. Skeleton: No discrete or worrisome osseous lesions. Advanced multilevel cervical spondylosis. 5 mm facet mediated anterolisthesis of C7 on T1. Upper chest: Better evaluated on concomitant CT of the chest. Other: None. IMPRESSION: 1. Extensive soft tissue emphysema tracking throughout the neck and visualized mediastinum, most pronounced about the esophagus. Finding raises the possibility for esophageal perforation and/or leak. Correlation with dedicated fluoroscopic upper GI study recommended for further evaluation. No appreciable defect about the visualized upper aero digestive tract. 2. No other acute abnormality within the neck. 3. Chronic ethmoidal and maxillary sinusitis. 4. Advanced cervical spondylosis with 5 mm facet mediated anterolisthesis of C7 on T1. Electronically Signed  By: Jeannine Boga M.D.   On: 06/30/2021 23:06   CT Chest Wo Contrast  Result Date: 07/01/2021 CLINICAL DATA:  Evaluate esophageal perforation seen on prior chest CT. EXAM: CT CHEST WITHOUT CONTRAST TECHNIQUE: Multidetector CT imaging of the chest was performed following the standard protocol without IV contrast. Patient was given 40 mL of oral water-soluble contrast prior to the scan, however the patient spit out the contrast as per the CT technologist. RADIATION DOSE REDUCTION: This exam was performed according to the departmental dose-optimization program which includes automated exposure control, adjustment of the mA and/or kV according to patient size and/or use of iterative reconstruction technique. COMPARISON:  June 30, 2021 FINDINGS: Cardiovascular: No significant vascular findings. There is mild to moderate severity cardiomegaly. No pericardial effusion. Mediastinum/Nodes: Stable moderate to marked severity pneumomediastinum is seen with stable, marked severity pneumatosis noted along the length of the esophagus. No enlarged  mediastinal or axillary lymph nodes are identified. The thyroid gland and trachea demonstrate no significant findings. Lungs/Pleura: Mild atelectasis is seen within the posterior aspect of the right lower lobe. There is no evidence of a pleural effusion or pneumothorax. Upper Abdomen: There is a small hiatal hernia. Musculoskeletal: Marked severity soft tissue emphysema is noted throughout the neck and is stable in appearance when compared to the prior study. No acute osseous abnormalities are identified. IMPRESSION: 1. Stable findings consistent with esophageal perforation with marked severity pneumatosis along the length of the esophagus and moderate to marked severity pneumomediastinum. 2. Marked severity soft tissue emphysema throughout the neck. 3. Mild posterior right basilar atelectasis. 4. Small hiatal hernia. Electronically Signed   By: Virgina Norfolk M.D.   On: 07/01/2021 01:47   CT CHEST WO CONTRAST  Result Date: 06/30/2021 CLINICAL DATA:  Shortness of breath, difficulty swallowing EXAM: CT CHEST WITHOUT CONTRAST TECHNIQUE: Multidetector CT imaging of the chest was performed following the standard protocol without IV contrast. RADIATION DOSE REDUCTION: This exam was performed according to the departmental dose-optimization program which includes automated exposure control, adjustment of the mA and/or kV according to patient size and/or use of iterative reconstruction technique. COMPARISON:  None. FINDINGS: Cardiovascular: Normal heart size. No significant pericardial effusion. The thoracic aorta is normal in caliber. No atherosclerotic plaque of the thoracic aorta. Left anterior descending coronary artery calcifications. Mediastinum/Nodes: Moderate volume pneumomediastinum. No enlarged mediastinal or axillary lymph nodes. Thyroid gland and trachea demonstrate no significant findings. Marked pneumatosis of the esophagus. Small hiatal hernia. Lungs/Pleura: No focal consolidation. No pulmonary nodule.  No pulmonary mass. No pneumothorax. Upper Abdomen: No acute abnormality. Musculoskeletal: Marked soft tissue emphysema within the neck. Please see separately dictated CT chest. No suspicious lytic or blastic osseous lesions. No acute displaced fracture. Multilevel degenerative changes of the spine. IMPRESSION: 1. Marked pneumatosis of the esophagus with associated pneumomediastinum suggestive of esophageal perforation. Markedly limited evaluation on this noncontrast study. If PO contrast administered, please use water-soluble contrast. 2. Small hiatal hernia. 3. Marked soft tissue emphysema within the neck. Please see separately dictated CT neck 06/30/2021. These results were called by telephone at the time of interpretation on 06/30/2021 at 10:12 pm to provider AMJAD ALI , who verbally acknowledged these results. Electronically Signed   By: Iven Finn M.D.   On: 06/30/2021 22:18   DG Chest Portable 1 View  Result Date: 06/30/2021 CLINICAL DATA:  Dyspnea EXAM: PORTABLE CHEST 1 VIEW COMPARISON:  01/18/2019 FINDINGS: Cardiomegaly. Streaky atelectasis left base. No consolidation or pleural effusion. No pneumothorax. IMPRESSION: Cardiomegaly with streaky atelectasis at  the left base. Electronically Signed   By: Donavan Foil M.D.   On: 06/30/2021 22:13   DG ESOPHAGUS W SINGLE CM (SOL OR THIN BA)  Result Date: 07/01/2021 CLINICAL DATA:  Pneumomediastinum after recurrent vomiting. Evaluate for esophageal perforation. EXAM: ESOPHOGRAM/BARIUM SWALLOW TECHNIQUE: Single contrast examination was performed using water-soluble contrast (Omnipaque 300) followed by thin barium. FLUOROSCOPY TIME:  Fluoroscopy Time: 2 minutes and 18 seconds of low-dose pulsed fluoroscopy Radiation Exposure Index (if provided by the fluoroscopic device): 41.1 mGy Number of Acquired Spot Images: 0 COMPARISON:  Recent CTs of the neck and chest. FINDINGS: Study was initiated with the patient in the semi erect position using water-soluble  contrast. The patient swallowed the contrast without difficulty. No esophageal perforation or mucosal irregularity identified on imaging in the AP and lateral projections. The study was subsequently repeated using barium. No esophageal perforation or mucosal abnormality identified. There is mild esophageal dysmotility. There is a small hiatal hernia with spontaneous gastroesophageal reflux. Extrinsic impression on the proximal thoracic esophagus by an aberrant right subclavian artery is noted. IMPRESSION: 1. No evidence of esophageal perforation or mucosal irregularity. Consider follow-up chest CT with oral contrast in 2-3 days given the esophageal pneumatosis and pneumomediastinum on CT. 2. Mild esophageal dysmotility with a small hiatal hernia and mild gastroesophageal reflux. Electronically Signed   By: Richardean Sale M.D.   On: 07/01/2021 09:42     Vladimir Crofts  07/02/2021, 12:38 PM   Attending physician's note  I have taken a history, reviewed the chart and examined the patient. I performed a substantive portion of this encounter, including complete performance of at least one of the key components, in conjunction with the APP. I agree with the APP's note, impression and recommendations.    51 year old male with polysubstance abuse, EtOH, cocaine and marijuana presented with complaints of trouble swallowing and pain in the throat.  He was having episodes of vomiting and also had hematemesis  He has not had any further vomiting episodes since admission CT chest showed soft tissue emphysema tracking through the neck and mediastinum, pronounced around the esophagus  Esophagram 1/17 showed no leakage of Gastrografin, noted pneumo mediastinum and esophageal pneumatosis  Patient likely had Mallory-Weiss tear and microperforation with air leakage, not a full-thickness tear given no extravasation of contrast on esophagram  He has pain in the back of his throat when he tries to swallow his  spit Start with sips of water and ice chips and advance to clear liquids if tolerating well  Carafate suspension 1 g every 6 hours if tolerating sips of water and ice chips  Pantoprazole IV 40 mg twice daily  Advised patient to avoid retching and request Zofran if he is having any episodes of nausea  Do not recommend EGD at this point, there is potential risk for esophageal perforation or full thickness tear  Monitor for alcohol withdrawal  Continue to monitor and support clinically as needed  GI is available if needed, please call with any questions   The patient was provided an opportunity to ask questions and all were answered. The patient agreed with the plan and demonstrated an understanding of the instructions.  Damaris Hippo , MD [phone]

## 2021-07-02 NOTE — Progress Notes (Signed)
Family Medicine Teaching Service Daily Progress Note Intern Pager: [phone]  Patient name: Kevin [name] Medical record number: [id number] Date of birth: [birth year] Age: 51 y.o. Gender: male  Primary Care Provider: Marliss Coots, NP Consultants: PCCM, cardiothoracic surgery Code Status: Full  Pt Overview and Major Events to Date:  1/16 [facility] 1/17 admitted to [place] and Plan:  [name]. [place] is a 51 year old male presenting with difficulty swallowing, hematemesis, globus sensation, swelling of his neck found to have significant pneumomediastinum and concern for esophageal perforation.  Past medical history significant for alcohol abuse, cocaine use, marijuana use, anxiety/depression, HTN, history of gastritis.  Pneumomediastinum This morning patient feels fine and explained to him reason for NPO status.  Hgb been stable at 11.1 from 11.3 yesterday. WBC 14.9 from 16.2. EKG NSR. -CT surgery following, appreciate recommendations -PCCM following, appreciate recommendations -Consider GI consult for possible esophageal injury -Consider follow-up CT with oral contrast tomorrow -Cardiac monitoring -Continuous pulse ox -NPO -IV tylenol q8h -IV Zosyn every 6 hours for 7 days -IV Protonix 40 mg every 24 hours -D5 normal saline IVF given patient n.p.o. -SCDs -Consider RD consult if TPN needed -Daily CBC, CMP  Neck swelling   submandibular swelling This morning patient's neck appears slightly less swollen.  Remains nontender to palpation.  Has been afebrile.  Has been saturating well in high 90s at room air without dyspnea. -Monitor respiratory status -Monitor fever curve  Alcohol abuse CIWA's have been all 0's most recently and highest of 2 last night.  Last drink was 1/16 at 5 PM.  Has history of severe withdrawals. AST 46. Mild hand tremor on exam.  -CIWA protocol with Ativan -Basal phenobarbital taper -Seizure precautions -Thiamine, folate,  multivitamin -TOC consult  Hypertension  Blood pressures have ranged 145-191/90 to 125.  Does not take any antihypertensive at home.  Has remained n.p.o. given concern for esophageal perforation and does not any have antihypertensive on board currently. Denies headache or vision changes. -Monitor blood pressures -Clonidine patch 0.2  History of gastritis   hematochezia Hemoglobin has remained stable at 11.1 today.  Abdomen appears distended, softer than usual and denies any abdominal pain. -Can consider GI consult  Urinary frequency   hematuria UA negative for infection.  Urine culture pending. -Monitor for symptoms  Polysubstance abuse Attests to cocaine use.  HIV nonreactive.  UDS positive for opiates, cocaine, THC. -TOC consult  FEN/GI: N.p.o. PPx: SCDs Dispo: Home pending clinical improvement  Subjective:  Denies any issues or pain overnight.  Says he misses eating/drinking but explained to him reason why we have been having him n.p.o.  Objective: Temp:  [98 F (36.7 C)-99.1 F (37.3 C)] 98.4 F (36.9 C) (01/18 0432) Pulse Rate:  [71-93] 72 (01/18 0432) Resp:  [12-22] 20 (01/18 0432) BP: (145-191)/(90-125) 145/103 (01/18 0432) SpO2:  [93 %-100 %] 100 % (01/18 0432) Weight:  [69.8 kg] 69.8 kg (01/17 2117) Physical Exam: General: NAD, laying in bed Cardiovascular: RRR no murmurs rubs or gallops, 2+ pulses Respiratory: Clear to auscultation bilaterally frontal lung fields, no increased work of breathing, speaking full sentences, voice muffled Abdomen: Mildly distended, soft, nontender to palpation, bowel sounds normoactive Extremities: No lower extremity edema  Laboratory: Recent Labs  Lab 06/30/21 2101 07/01/21 2211 07/02/21 0229  WBC 17.0* 16.2* 14.9*  HGB 12.7* 11.3* 11.1*  HCT 37.0* 32.6* 32.4*  PLT 316 280 257   Recent Labs  Lab 06/30/21 2101 07/01/21 2211 07/02/21 0229  NA 133* 133* 134*  K 3.1* 3.6 3.5  CL 97* 101 104  CO2 24 24 21*  BUN 10 18  19   CREATININE 0.93 0.97 1.07  CALCIUM 8.2* 7.7* 7.7*  PROT  --  7.0 6.8  BILITOT  --  1.4* 1.0  ALKPHOS  --  94 83  ALT  --  41 36  AST  --  54* 46*  GLUCOSE 126* 112* 106*    Imaging/Diagnostic Tests:   Gerrit Heck, MD 07/02/2021, 8:43 AM PGY-1, [place] Intern pager: [phone], text pages welcome

## 2021-07-02 NOTE — Progress Notes (Signed)
FPTS Brief Progress Note  S:Went to see patient, patient laying comfortably in bed, falling asleep. Says he'll take things one day at a time.    O: BP (!) 160/115    Pulse 82    Temp 98.6 F (37 C) (Oral)    Resp 19    Ht 5\' 5"  (1.651 m)    Wt 69.8 kg    SpO2 99%    BMI 25.61 kg/m     A/P: - Plans per day team - Orders reviewed. Labs for AM ordered, which was adjusted as needed.    , MD 07/02/2021, 10:07 PM PGY-1, 07/04/2021 [facility] Family [facility] Night Resident  Please page [phone] with questions.

## 2021-07-02 NOTE — Progress Notes (Signed)
Pt provided with ice chips and water for sipping as per orders from GI.  To be monitored until 1700 before advancing to clears.

## 2021-07-02 NOTE — Progress Notes (Signed)
07/02/2021  I have seen and evaluated the patient for pneumomediastinum and EtOH w/d.  S:  Doing well on phenobarb taper. Throat feeling better. Wants to eat.  O: Blood pressure (!) 145/103, pulse 72, temperature 98.4 F (36.9 C), temperature source Oral, resp. rate 20, height 5\' 5"  (1.651 m), weight 69.8 kg, SpO2 100 %.  No distress Stable submandibular swelling Lungs clear Moves all 4 ext to command Not tremulous  WBC improved  A:  Likely esophageal injury without evidence of full thickness injury in setting of EtOH abuse, N/V   EtOH w/d  P:  - Continue phenobarb taper as ordered with PRN ativan per CIWA - Diet advancement per primary/TCTS discussion - Given improvement, we are available PRN  MD [facility] Prefer epic messenger for cross cover needs If after hours, please call E-link

## 2021-07-03 ENCOUNTER — Inpatient Hospital Stay (HOSPITAL_COMMUNITY): Payer: Medicaid Other

## 2021-07-03 DIAGNOSIS — I10 Essential (primary) hypertension: Secondary | ICD-10-CM

## 2021-07-03 LAB — COMPREHENSIVE METABOLIC PANEL
ALT: 45 U/L — ABNORMAL HIGH (ref 0–44)
AST: 83 U/L — ABNORMAL HIGH (ref 15–41)
Albumin: 2.3 g/dL — ABNORMAL LOW (ref 3.5–5.0)
Alkaline Phosphatase: 75 U/L (ref 38–126)
Anion gap: 7 (ref 5–15)
BUN: 12 mg/dL (ref 6–20)
CO2: 22 mmol/L (ref 22–32)
Calcium: 7.4 mg/dL — ABNORMAL LOW (ref 8.9–10.3)
Chloride: 105 mmol/L (ref 98–111)
Creatinine, Ser: 0.92 mg/dL (ref 0.61–1.24)
GFR, Estimated: >60 mL/min (ref >60)
Glucose, Bld: 94 mg/dL (ref 70–99)
Potassium: 3.2 mmol/L — ABNORMAL LOW (ref 3.5–5.1)
Sodium: 134 mmol/L — ABNORMAL LOW (ref 135–145)
Total Bilirubin: 0.9 mg/dL (ref 0.3–1.2)
Total Protein: 6.3 g/dL — ABNORMAL LOW (ref 6.5–8.1)

## 2021-07-03 LAB — CBC
HCT: 32 % — ABNORMAL LOW (ref 39.0–52.0)
Hemoglobin: 11.1 g/dL — ABNORMAL LOW (ref 13.0–17.0)
MCH: 32.8 pg (ref 26.0–34.0)
MCHC: 34.7 g/dL (ref 30.0–36.0)
MCV: 94.7 fL (ref 80.0–100.0)
Platelets: 239 10*3/uL (ref 150–400)
RBC: 3.38 MIL/uL — ABNORMAL LOW (ref 4.22–5.81)
RDW: 13.6 % (ref 11.5–15.5)
WBC: 8.6 10*3/uL (ref 4.0–10.5)
nRBC: 0 % (ref 0.0–0.2)

## 2021-07-03 MED ORDER — IOHEXOL 9 MG/ML PO SOLN
ORAL | Status: AC
  Filled 2021-07-03: qty 500

## 2021-07-03 MED ORDER — POTASSIUM CHLORIDE 20 MEQ PO PACK
40.0000 meq | PACK | Freq: Once | ORAL | Status: AC
  Administered 2021-07-03: 40 meq via ORAL
  Filled 2021-07-03: qty 2

## 2021-07-03 NOTE — Progress Notes (Signed)
FPTS Brief Progress Note  S:Received page that patient declining CT scan. Went to speak to patient, patient was sleeping and irritable upon awakening, but agreeable to CT scan. Unsure why patient had initially declined scan.    O: BP (!) 175/115 (BP Location: Left Arm)    Pulse 84    Temp 98.7 F (37.1 C) (Oral)    Resp 18    Ht 5\' 5"  (1.651 m)    Wt 69.8 kg    SpO2 98%    BMI 25.61 kg/m     A/P: -Plans per day team - Orders reviewed. Labs for AM ordered, which was adjusted as needed.    , MD 07/03/2021, 10:40 PM PGY-1, [facility] Family Medicine Night Resident  Please page [phone] with questions.

## 2021-07-03 NOTE — Progress Notes (Addendum)
Family Medicine Teaching Service Daily Progress Note Intern Pager: [phone]  Patient name: Kevin [name] Medical record number: [id number] Date of birth: 04/20/1971 Age: 51 y.o. Gender: male  Primary Care Provider: Marliss Coots, NP Consultants: PCCM, CT surgery Code Status: Full   Pt Overview and Major Events to Date:  1/16 WLED 1/17 admitted to [facility] 1/18 GI advanced diet to [facility]  Assessment and Plan:  Kevin [name] is a 51 year old male who presented with difficulty swallowing, hematemesis, globus sensation, swelling of his neck and arm significant pneumomediastinum and concern for esophageal perforation.  PMH significant for alcohol abuse, cocaine use, marijuana use, anxiety/depression, HTN, history of gastritis  Pneumomediastinum This morning patient feels good and has been tolerating clear liquids per GI.  GI recommended no scope given risk of full thickness tear. Hemoglobin has been stable at 11.1.  Has remained afebrile and is saturating well on room air.  WBC 8.6 today from 14.9 yesterday. -GI consulted, appreciate recommendations -Diet advancement per GI, currently tolerating clear liquids  -CT with contrast today to see if improving -Cardiac monitoring -Continuous pulse ox -IV Tylenol every 8 hours -IV Zosyn every 6 hours for 7 days -IV Protonix twice daily -Zofran -D5 normal saline IVF, decrease with increase in diet -SCDs -Daily CBC, CMP  Neck swelling   submandibular swelling Patient's neck this morning appears similar to yesterday.  Remains nontender to palpation.  He has been saturating in high 90s on room air without dyspnea. -Monitor respiratory status  Alcohol Use   Transaminitis CIWA's have been 0, 0, 1.  Had history of severe withdrawals.  AST 83 today, ALT 45. ~2:1 ratio likely 2/2 to alcohol use. No abdominal pain. No visible tremors on exam today. -Phenobarbital taper, decreasing length of taper given low CIWA scores/clinical  condition -CIWA protocol  HTN BP ranges have been [id number] systolics, [id number] diastolics. -clonidine patch -add amlodipine 5 mg PO when patient able to advance diet -monitor BP  History of gastritis   hematochezia Hemoglobin remained stable at 11.1 today.  Abdomen mildly distended but nontender to palpation -GI consulted, appreciate recommendations -Protonix twice daily  Hypokalemia 3.2 today.  -Monitor on BMP -Replete as necessary  Stable/chronic Urinary frequency   hematuria-UA/culture negative Polysubstance abuse-cocaine/THC-TOC consult  FEN/GI: Clear Liquid PPx: SCDs Dispo:Home pending clinical improvement  Subjective:  No issues overnight, says he just has similar pain when swallowing but has been tolerating liquids well.  Objective: Temp:  [97.5 F (36.4 C)-98.8 F (37.1 C)] 97.5 F (36.4 C) (01/19 0353) Pulse Rate:  [64-91] 64 (01/19 0353) Resp:  [15-26] 20 (01/19 0353) BP: (151-178)/(99-120) 151/99 (01/19 0353) SpO2:  [97 %-100 %] 100 % (01/19 0353) Physical Exam: General: NAD, laying in bed comfortably Cardiovascular: RRR no m/r/g Respiratory: CTAB no w/r/c Abdomen: Mildly distended, nontender to palpation Extremities: No LE edema  Laboratory: Recent Labs  Lab 07/01/21 2211 07/02/21 0229 07/03/21 0441  WBC 16.2* 14.9* 8.6  HGB 11.3* 11.1* 11.1*  HCT 32.6* 32.4* 32.0*  PLT 280 257 239   Recent Labs  Lab 07/01/21 2211 07/02/21 0229 07/03/21 0441  NA 133* 134* 134*  K 3.6 3.5 3.2*  CL 101 104 105  CO2 24 21* 22  BUN 18 19 12   CREATININE 0.97 1.07 0.92  CALCIUM 7.7* 7.7* 7.4*  PROT 7.0 6.8 6.3*  BILITOT 1.4* 1.0 0.9  ALKPHOS 94 83 75  ALT 41 36 45*  AST 54* 46* 83*  GLUCOSE 112* 106* 94  Imaging/Diagnostic Tests:   Gerrit Heck, MD 07/03/2021, 7:18 AM PGY-1, [place] Intern pager: [phone], text pages welcome

## 2021-07-03 NOTE — Progress Notes (Addendum)
Progress Note   Attending physician's note   I have taken a history, reviewed the chart and examined the patient. I performed a substantive portion of this encounter, including complete performance of at least one of the key components, in conjunction with the APP. I agree with the APP's note, impression and recommendations.    Patient is tolerating liquid diet.  Slowly advance as tolerated to soft diet for the next 1 to 2 days. He continues to have mild odynophagia Follow-up chest x-ray to document resolution of pneumomediastinum  Continue PPI twice daily and Carafate  Continue supportive care  Discussed alcohol cessation and also advised patient to abstain from cocaine   GI will sign off, is available if needed.  Please call with any questions   The patient was provided an opportunity to ask questions and all were answered. The patient agreed with the plan and demonstrated an understanding of the instructions.   Damaris Hippo , MD [phone]      Subjective  Chief Complaint: esophageal perforation  Patient tolerating liquid diet.  Denies nausea or vomiting States continues to have some hoarseness, odynophagia, but this is improving. Denies any shortness of breath, chest pain. Patient did have fecal incontinence this morning was able to see stool, was light brown, no melena or hematochezia and noticed.    Objective   Vital signs in last 24 hours: Temp:  [97.5 F (36.4 C)-98.8 F (37.1 C)] 97.7 F (36.5 C) (01/19 0801) Pulse Rate:  [60-91] 60 (01/19 0801) Resp:  [17-26] 20 (01/19 0801) BP: (151-178)/(99-120) 171/120 (01/19 0801) SpO2:  [97 %-100 %] 100 % (01/19 0801) Last BM Date: 07/03/21  General:   male in no acute distress  Heart:  regular rate and rhythm, no murmurs or gallops Pulm: Clear anteriorly; no wheezing Abdomen:  Soft, Obese AB, positive bowel sounds, nontender Neurologic:  Alert and  oriented x4;  grossly normal neurologically.  Skin:   Dry  and intact without significant lesions or rashes. Psychiatric: Cooperative. Normal mood and affect.  Intake/Output from previous day: 01/18 0701 - 01/19 0700 In: 1095.7 [I.V.:1054.3; IV Piggyback:41.4] Out: 850 [Urine:850] Intake/Output this shift: Total I/O In: 200 [P.O.:200] Out: 950 [Urine:950]  Lab Results: Recent Labs    07/01/21 2211 07/02/21 0229 07/03/21 0441  WBC 16.2* 14.9* 8.6  HGB 11.3* 11.1* 11.1*  HCT 32.6* 32.4* 32.0*  PLT 280 257 239   BMET Recent Labs    07/01/21 2211 07/02/21 0229 07/03/21 0441  NA 133* 134* 134*  K 3.6 3.5 3.2*  CL 101 104 105  CO2 24 21* 22  GLUCOSE 112* 106* 94  BUN 18 19 12   CREATININE 0.97 1.07 0.92  CALCIUM 7.7* 7.7* 7.4*   LFT Recent Labs    07/03/21 0441  PROT 6.3*  ALBUMIN 2.3*  AST 83*  ALT 45*  ALKPHOS 75  BILITOT 0.9   PT/INR No results for input(s): LABPROT, INR in the last 72 hours.  Studies/Results: No results found.    Impression/Plan:    Pneumomediastinum ([place]) in setting of significant alcohol and cocaine use after episode of hematemesis, esophagram 01/17 with water soluble and barium showed no evidence of esophageal perforation or mucosal irregularity and mild esophageal dysmotility with a small hiatal hernia with mild GERD -Continue PPI twice daily, Carafate suspension 4 times daily. -Continue liquid diet at this time -Suggest follow-up chest x-ray tomorrow morning. -No role for upper endoscopy at this time without evidence of perforation as EGD with insufflation  potentially worsen the situation   Polysubstance use disorder Possibly contributing to prior   Alcohol dependence ([place])  On CIWA protocol   Elevated LFTs most likely secondary to ETOH  AST 46 (54) ALT 36 Platelets 257 Alkphos 83 TBili 1.0 (1.4) No thrombocytopenia   Leukocytosis WBC 14.9 HGB 11.1 MCV 94.2 Platelets 257 Down trending-improved On zosyn   Normocytic anemia Baseline appears to be around 10-11 At  11.1-stable     LOS: 2 days   Vladimir Crofts  07/03/2021, 10:37 AM

## 2021-07-04 ENCOUNTER — Inpatient Hospital Stay (HOSPITAL_COMMUNITY): Payer: Medicaid Other

## 2021-07-04 LAB — CBC
HCT: 34.9 % — ABNORMAL LOW (ref 39.0–52.0)
Hemoglobin: 11.8 g/dL — ABNORMAL LOW (ref 13.0–17.0)
MCH: 32.1 pg (ref 26.0–34.0)
MCHC: 33.8 g/dL (ref 30.0–36.0)
MCV: 94.8 fL (ref 80.0–100.0)
Platelets: 268 10*3/uL (ref 150–400)
RBC: 3.68 MIL/uL — ABNORMAL LOW (ref 4.22–5.81)
RDW: 13.1 % (ref 11.5–15.5)
WBC: 8.5 10*3/uL (ref 4.0–10.5)
nRBC: 0 % (ref 0.0–0.2)

## 2021-07-04 LAB — COMPREHENSIVE METABOLIC PANEL
ALT: 85 U/L — ABNORMAL HIGH (ref 0–44)
AST: 157 U/L — ABNORMAL HIGH (ref 15–41)
Albumin: 2.6 g/dL — ABNORMAL LOW (ref 3.5–5.0)
Alkaline Phosphatase: 87 U/L (ref 38–126)
Anion gap: 11 (ref 5–15)
BUN: 7 mg/dL (ref 6–20)
CO2: 20 mmol/L — ABNORMAL LOW (ref 22–32)
Calcium: 8.1 mg/dL — ABNORMAL LOW (ref 8.9–10.3)
Chloride: 100 mmol/L (ref 98–111)
Creatinine, Ser: 0.81 mg/dL (ref 0.61–1.24)
GFR, Estimated: >60 mL/min (ref >60)
Glucose, Bld: 82 mg/dL (ref 70–99)
Potassium: 3.4 mmol/L — ABNORMAL LOW (ref 3.5–5.1)
Sodium: 131 mmol/L — ABNORMAL LOW (ref 135–145)
Total Bilirubin: 1.3 mg/dL — ABNORMAL HIGH (ref 0.3–1.2)
Total Protein: 7 g/dL (ref 6.5–8.1)

## 2021-07-04 MED ORDER — AMOXICILLIN-POT CLAVULANATE 400-57 MG/5ML PO SUSR
875.0000 mg | Freq: Two times a day (BID) | ORAL | Status: AC
Start: 2021-07-04 — End: 2021-07-07
  Administered 2021-07-04 – 2021-07-06 (×5): 875 mg via ORAL
  Filled 2021-07-04 (×6): qty 10.9

## 2021-07-04 MED ORDER — AMLODIPINE BESYLATE 5 MG PO TABS
5.0000 mg | ORAL_TABLET | Freq: Every day | ORAL | Status: DC
  Administered 2021-07-04 – 2021-07-05 (×2): 5 mg via ORAL
  Filled 2021-07-04 (×2): qty 1

## 2021-07-04 MED ORDER — PANTOPRAZOLE SODIUM 40 MG PO TBEC
40.0000 mg | DELAYED_RELEASE_TABLET | Freq: Two times a day (BID) | ORAL | Status: DC
  Administered 2021-07-04 – 2021-07-07 (×6): 40 mg via ORAL
  Filled 2021-07-04 (×6): qty 1

## 2021-07-04 MED ORDER — POTASSIUM CHLORIDE 20 MEQ PO PACK
40.0000 meq | PACK | Freq: Once | ORAL | Status: AC
  Administered 2021-07-04: 40 meq via ORAL
  Filled 2021-07-04: qty 2

## 2021-07-04 MED ORDER — IOHEXOL 350 MG/ML SOLN
75.0000 mL | Freq: Once | INTRAVENOUS | Status: AC | PRN
  Administered 2021-07-04: 75 mL via INTRAVENOUS

## 2021-07-04 MED ORDER — PHENOBARBITAL 60 MG/ML ORAL SUSPENSION: 30.0000 mg | Freq: Three times a day (TID) | ORAL | Status: DC

## 2021-07-04 MED ORDER — PHENOBARBITAL 20 MG/5ML PO ELIX
30.0000 mg | ORAL_SOLUTION | Freq: Three times a day (TID) | ORAL | Status: AC
  Administered 2021-07-04 – 2021-07-05 (×3): 30 mg via ORAL
  Filled 2021-07-04 (×3): qty 7.5

## 2021-07-04 NOTE — Progress Notes (Signed)
FPTS Brief Progress Note  S:Patient laying comfortably in bed. Says throat hurts, but much less than it did day prior. Patient thinking he needs to take it easy and stop drinking.    O: BP (!) 156/97 (BP Location: Right Arm)    Pulse 79    Temp 98.4 F (36.9 C) (Oral)    Resp 16    Ht 5\' 5"  (1.651 m)    Wt 69.8 kg    SpO2 98%    BMI 25.61 kg/m     A/P: -Plans per day team - Orders reviewed. Labs for AM ordered, which was adjusted as needed.    , MD 07/04/2021, 10:27 PM PGY-1, [facility] Family Medicine Night Resident  Please page [phone] with questions.

## 2021-07-04 NOTE — Progress Notes (Signed)
Family Medicine Teaching Service Daily Progress Note Intern Pager: [phone]  Patient name: Kevin [name] Medical record number: [id number] Date of birth: 1970/08/19 Age: 51 y.o. Gender: male  Primary Care Provider: Marliss Coots, NP Consultants: CCM, CT surgery, GI signed off Code Status: Full  Pt Overview and Major Events to Date:  1/16 WLED 1/17 admitted to vascular 1/18 GI advance diet to liquids  Assessment and Plan:  Kevin Provost. [name] is a 51 year old male who presented with difficulty swallowing, hematemesis, globus sensation, swelling of his neck and found to have significant pneumomediastinum and concern for esophageal perforation.  Past medical history significant for alcohol abuse, cocaine use, marijuana use, anxiety depression, HTN, history of gastritis  Pneumomediastinum This morning patient stated he feels better and is tolerating clear liquids and feels like his odynophagia is getting better and rated pain during swallowing a 4 out of 10 from previous 7 out of 10.  WBC 8.5 from 8.6 yesterday.  Has remained afebrile.  Continues to be saturating well on room air.  GI signed off and recommended advancing diet as tolerated. -Tolerating clear liquids, advance to dysphagia 2 -CT with contrast today -Cardiac monitoring -Continuous pulse ox -crushed Tylenol every 6 hours -Switch IV Zosyn to augmentin liquid for 3 day -Switch to oral protonix -Zofran -D5 normal saline IV fluids d/c today -SCDs -CBC, CMP  Neck swelling   submandibular swelling Patient's neck this morning appears similar to yesterday.  Remains nontender to palpation and is not having significant respiratory effort. -Monitor respiratory status  Alcohol use   transaminitis CIWA's have been 1,1, 1.  AST 157, ALT 85. ~2:1.  Likely secondary to alcohol use. -phenobarbital taper 32.5 liquid suspension -CIWA protocol   HTN BP ranges have been 152-187 over 97-126.  -Continue clonidine patch -Crush  amlodipine 5 mg   Gastritis   hematochezia Hemoglobin has been stable at 11.8 from 11.1.  Abdomen nontender to palpation. -Protonix twice daily  Hypokalemia 3.4 today. -Repleted 40 meq packet -BMP  Stable/chronic Urinary frequency   hematuria Polysubstance abuse  FEN/GI: Dysphagia 2 PPx: SCDs Dispo: Home pending clinical improvement  Subjective:  This morning patient feels better, is amenable to getting CT scan today.  Objective: Temp:  [97.7 F (36.5 C)-98.7 F (37.1 C)] 98.2 F (36.8 C) (01/20 0737) Pulse Rate:  [60-84] 76 (01/20 0737) Resp:  [18-20] 18 (01/20 0737) BP: (152-187)/(97-126) 169/109 (01/20 0737) SpO2:  [93 %-100 %] 98 % (01/20 0737) Physical Exam: General: NAD, laying in bed comfortably, muffled voice getting better Neck: Swelling getting better, submandibular nodes enlarged, nontender to palpation, no crepitus Cardiovascular: RRR no murmurs rubs or gallops, 2+ pulses Respiratory: Clear to auscultation bilaterally Abdomen: Soft, nontender to palpation Extremities: No lower extremity edema  Laboratory: Recent Labs  Lab 07/02/21 0229 07/03/21 0441 07/04/21 0145  WBC 14.9* 8.6 8.5  HGB 11.1* 11.1* 11.8*  HCT 32.4* 32.0* 34.9*  PLT 257 239 268   Recent Labs  Lab 07/02/21 0229 07/03/21 0441 07/04/21 0145  NA 134* 134* 131*  K 3.5 3.2* 3.4*  CL 104 105 100  CO2 21* 22 20*  BUN 19 12 7   CREATININE 1.07 0.92 0.81  CALCIUM 7.7* 7.4* 8.1*  PROT 6.8 6.3* 7.0  BILITOT 1.0 0.9 1.3*  ALKPHOS 83 75 87  ALT 36 45* 85*  AST 46* 83* 157*  GLUCOSE 106* 94 82    Imaging/Diagnostic Tests:   Gerrit Heck, MD 07/04/2021, 7:52 AM PGY-1, [place] Intern pager:  [phone], text pages welcome

## 2021-07-05 ENCOUNTER — Inpatient Hospital Stay (HOSPITAL_COMMUNITY): Payer: Medicaid Other

## 2021-07-05 LAB — COMPREHENSIVE METABOLIC PANEL
ALT: 118 U/L — ABNORMAL HIGH (ref 0–44)
AST: 173 U/L — ABNORMAL HIGH (ref 15–41)
Albumin: 2.4 g/dL — ABNORMAL LOW (ref 3.5–5.0)
Alkaline Phosphatase: 86 U/L (ref 38–126)
Anion gap: 6 (ref 5–15)
BUN: 7 mg/dL (ref 6–20)
CO2: 21 mmol/L — ABNORMAL LOW (ref 22–32)
Calcium: 8 mg/dL — ABNORMAL LOW (ref 8.9–10.3)
Chloride: 103 mmol/L (ref 98–111)
Creatinine, Ser: 0.86 mg/dL (ref 0.61–1.24)
GFR, Estimated: >60 mL/min (ref >60)
Glucose, Bld: 110 mg/dL — ABNORMAL HIGH (ref 70–99)
Potassium: 3.4 mmol/L — ABNORMAL LOW (ref 3.5–5.1)
Sodium: 130 mmol/L — ABNORMAL LOW (ref 135–145)
Total Bilirubin: 0.6 mg/dL (ref 0.3–1.2)
Total Protein: 6.6 g/dL (ref 6.5–8.1)

## 2021-07-05 LAB — HEPATITIS PANEL, ACUTE
HCV Ab: NONREACTIVE
Hep A IgM: NONREACTIVE
Hep B C IgM: NONREACTIVE
Hepatitis B Surface Ag: NONREACTIVE

## 2021-07-05 LAB — CBC
HCT: 32.6 % — ABNORMAL LOW (ref 39.0–52.0)
Hemoglobin: 11.5 g/dL — ABNORMAL LOW (ref 13.0–17.0)
MCH: 32.7 pg (ref 26.0–34.0)
MCHC: 35.3 g/dL (ref 30.0–36.0)
MCV: 92.6 fL (ref 80.0–100.0)
Platelets: 268 10*3/uL (ref 150–400)
RBC: 3.52 MIL/uL — ABNORMAL LOW (ref 4.22–5.81)
RDW: 12.9 % (ref 11.5–15.5)
WBC: 7.9 10*3/uL (ref 4.0–10.5)
nRBC: 0 % (ref 0.0–0.2)

## 2021-07-05 MED ORDER — LOSARTAN POTASSIUM 25 MG PO TABS
25.0000 mg | ORAL_TABLET | Freq: Every day | ORAL | Status: DC
  Administered 2021-07-05 – 2021-07-07 (×3): 25 mg via ORAL
  Filled 2021-07-05 (×3): qty 1

## 2021-07-05 MED ORDER — AMLODIPINE BESYLATE 10 MG PO TABS
10.0000 mg | ORAL_TABLET | Freq: Every day | ORAL | Status: DC
  Administered 2021-07-06 – 2021-07-07 (×2): 10 mg via ORAL
  Filled 2021-07-05 (×2): qty 1

## 2021-07-05 MED ORDER — POTASSIUM CHLORIDE 20 MEQ PO PACK
40.0000 meq | PACK | Freq: Once | ORAL | Status: AC
  Administered 2021-07-05: 40 meq via ORAL
  Filled 2021-07-05: qty 2

## 2021-07-05 MED ORDER — CLONIDINE HCL 0.1 MG/24HR TD PTWK: 0.1000 mg | MEDICATED_PATCH | TRANSDERMAL | Status: DC

## 2021-07-05 NOTE — Progress Notes (Signed)
Family Medicine Teaching Service Daily Progress Note Intern Pager: [phone]  Patient name: Kevin [name] Medical record number: [id number] Date of birth: 1970/06/21 Age: 51 y.o. Gender: male  Primary Care Provider: Marliss Coots, NP Consultants: CCM, CT surgery, GI s/o Code Status: Full   Pt Overview and Major Events to Date:  1/16 WLED 1/17 admitted to vascular 1/18 GI advance diet to liquids  Assessment and Plan: Kevin [name] is a 51 year old male who presented with difficulty swallowing, hematemesis, globus sensation, swelling of his neck and found to have significant pneumomediastinum and concern for esophageal perforation.  Past medical history significant for alcohol abuse, cocaine use, marijuana use, anxiety depression, HTN, history of gastritis  Pneumomediastinum  Patient feeling improved and tolerating advancement of diet to soft foods. Leukocytes resolved. He has remained afebrile. Repeat CT with almost complete clearing of pneumomediastinum. Few scattered small pockets of air in mediastinum and R side of neck. No evidence of significant air in esophageal wall. GI signed off and recommended advancement of diet.  - advance to dysphagia 3 diet  - Tylenol 650mg  q 6h prn - zofran 4mg  q 6prn nausea  - PT recommended no follow up   Neck and submandibular swelling Neck stable and remains non tender to palpation and without respiratory distress   Alcohol Use   Transaminitis  AST 173 up from 157 yesterday and 54 on admission, ALT 118 up from 85 yesterday and 41 on admission. Given acute rise in liver enzymes will check hepatitis panel and obtain RUQ ultrasound  Currently on phenobarb taper at 32.5mg . Patient has remained stable and CIWA protocol discontinued. - f/u hepatitis panel and Hep B surface Ab  - f/u RUQ [place] - continue MVI, thiamine, folic acid - TOC consult placed   HTN  Bps have ranged from 14-168/92-104. Currently 167/104. Currently on Amlodpine 5mg  and  Clonidine .2mg  patch  - Increase Amlodipine to 10mg   - Decrease clonidine 0.1mg  weekly - Added Losartan 25mg   - continue to monitor   Gastritis Currently asymptomatic  - continue protonix BID   Hypokalemia K+ 3.4. Replaced with 16meq of Kcl - continue to monitor  Stable/chronic Urinary frequency   hematuria  Polysubstance abuse   SDOH Patient is homeless and living out of a laundry mat. Unsure if he follows with a PCP. Placed TOC consult to assist with resources available as well as medication assistance if possible, and alcohol/ substance abuse cessation resources  - f/w SW consult   FEN/GI: Dysphagia 3 PPx: SCDs   Disposition: Home possibly tomorrow or Monday.   Subjective:   No acute events overnight. Patient feeling improved, standing up by the side of the bed when I came into the room. He requests to stay another night as he continues to heal. He stated he does want to stop using alcohol and knows that his life depends on it. He states he either stops drinking, or continues to drink and die. Discussed placing SW consult to provide him with substance use cessation resources which he was agreeable to.   Objective: Temp:  [98.3 F (36.8 C)-98.8 F (37.1 C)] 98.3 F (36.8 C) (01/21 0719) Pulse Rate:  [70-100] 70 (01/21 0719) Resp:  [15-22] 16 (01/21 0719) BP: (140-168)/(92-104) 167/104 (01/21 0719) SpO2:  [95 %-99 %] 98 % (01/21 0719) Physical Exam: General: alert, pleasant, walking around room, NAD Cardiovascular: RRR no murmurs Respiratory: CTAB normal WOB Abdomen: soft, non distended Extremities: warm, dry, no edema   Laboratory: Recent Labs  Lab  07/03/21 0441 07/04/21 0145 07/05/21 0202  WBC 8.6 8.5 7.9  HGB 11.1* 11.8* 11.5*  HCT 32.0* 34.9* 32.6*  PLT 239 268 268   Recent Labs  Lab 07/03/21 0441 07/04/21 0145 07/05/21 0202  NA 134* 131* 130*  K 3.2* 3.4* 3.4*  CL 105 100 103  CO2 22 20* 21*  BUN 12 7 7   CREATININE 0.92 0.81 0.86  CALCIUM  7.4* 8.1* 8.0*  PROT 6.3* 7.0 6.6  BILITOT 0.9 1.3* 0.6  ALKPHOS 75 87 86  ALT 45* 85* 118*  AST 83* 157* 173*  GLUCOSE 94 82 110*     Imaging/Diagnostic Tests: CT CHEST W CONTRAST  Result Date: 07/04/2021 CLINICAL DATA:  Chest pain, pneumomediastinum seen in the previous CT EXAM: CT CHEST WITH CONTRAST TECHNIQUE: Multidetector CT imaging of the chest was performed during intravenous contrast administration. RADIATION DOSE REDUCTION: This exam was performed according to the departmental dose-optimization program which includes automated exposure control, adjustment of the mA and/or kV according to patient size and/or use of iterative reconstruction technique. CONTRAST:  31mL OMNIPAQUE IOHEXOL 350 MG/ML SOLN COMPARISON:  07/01/2021 FINDINGS: Cardiovascular: There is homogeneous enhancement in thoracic aorta. Right subclavian is arising from the left side of aortic arch and is traversing the mediastinum posterior to the esophagus. There is ectasia of ascending thoracic aorta measuring 3.8 cm. There are no intraluminal filling defects in the pulmonary artery branches. Mediastinum/Nodes: No new significant lymphadenopathy is seen. There is almost complete interval clearing pneumomediastinum. There are few small pockets of air in the superior and middle mediastinum. There are no pockets of air in the wall of the esophagus. No significant inflammatory changes are noted in the posterior mediastinum. Small pockets of air are noted in the soft tissues of the neck. Lungs/Pleura: There are small linear densities in the posterior lower lung fields. Small bilateral pleural effusions are seen. There is no pneumothorax. Upper Abdomen: There is fatty infiltration in the liver. Musculoskeletal: Unremarkable. IMPRESSION: There is almost complete interval clearing of pneumomediastinum. There are few scattered residual tiny pockets of air in mediastinum and right side of the neck. There is no evidence of any significant  air in the esophageal wall. There are no inflammatory changes or loculated fluid collections in the posterior mediastinum. Small bilateral pleural effusions. Small linear densities in the posterior lower lung fields may suggest subsegmental atelectasis. There is no evidence of thoracic aortic dissection. There is no evidence of pulmonary artery embolism. Fatty liver. Electronically Signed   By: Elmer Picker M.D.   On: 07/04/2021 16:52     Shary Key, DO 07/05/2021, 8:28 AM PGY-2, [place] Intern pager: [phone], text pages welcome

## 2021-07-05 NOTE — Evaluation (Addendum)
Occupational Therapy Evaluation Patient Details Name: Kevin [name] MRN: [id number] DOB: 06/19/1970 Today's Date: 07/05/2021   History of Present Illness Pt is a 51 y.o. male admitted to [facility] 06/30/21 with significant pneumomediastinum and concern for esophageal perforation; CIWA. Repeat Chest CT 1/20 with almost complete interval clearing of pneumomediastinum; small bilateral pleural effusions. PMH includes polysubstance abuse, HTN, gastritis, anxiety, depression.   Clinical Impression   Patient admitted for the diagnosis above.  PTA he does stay with a SO, but admits to be homeless on occasion.  At baseline he uses a 4WRW, but does not need physical assist with ADL/mobility.  Patient continues to express pain around his neck and hips with movement.  Patient appears to be at his baseline for in room mobility/toileting at 4wrw, and ADL completion from a sit/stand level.  No further needs in the acute setting, no post acute OT recommended.        Recommendations for follow up therapy are one component of a multi-disciplinary discharge planning process, led by the attending physician.  Recommendations may be updated based on patient status, additional functional criteria and insurance authorization.   Follow Up Recommendations  No OT follow up    Assistance Recommended at Discharge PRN  Patient can return home with the following      Functional Status Assessment  Patient has not had a recent decline in their functional status  Equipment Recommendations       Recommendations for Other Services       Precautions / Restrictions Precautions Precautions: Fall Restrictions Weight Bearing Restrictions: No      Mobility Bed Mobility Overal bed mobility: Modified Independent                  Transfers Overall transfer level: Modified independent Equipment used: Rollator (4 wheels)                      Balance Overall balance assessment: Mild deficits observed,  not formally tested                                         ADL either performed or assessed with clinical judgement   ADL Overall ADL's : At baseline                                       General ADL Comments: Minimal setup needed.  No physical assist.     Vision Patient Visual Report: No change from baseline       Perception Perception Perception: Not tested   Praxis Praxis Praxis: Not tested    Pertinent Vitals/Pain Pain Assessment Pain Assessment: Faces Faces Pain Scale: Hurts a little bit Pain Location: bilateral hips and neck/throat Pain Descriptors / Indicators: Sore, Tender Pain Intervention(s): Monitored during session     Hand Dominance Right   Extremity/Trunk Assessment Upper Extremity Assessment Upper Extremity Assessment: Overall WFL for tasks assessed   Lower Extremity Assessment Lower Extremity Assessment: Defer to PT evaluation       Communication Communication Communication: No difficulties   Cognition Arousal/Alertness: Awake/alert Behavior During Therapy: WFL for tasks assessed/performed Overall Cognitive Status: Difficult to assess  General Comments: patient with decreased attention span, short term memory deficts, difficulty with complex thought.     General Comments       Exercises     Shoulder Instructions      Home Living Family/patient expects to be discharged to:: Private residence Living Arrangements: Non-relatives/Friends Available Help at Discharge: Friend(s);Available PRN/intermittently Type of Home: House Home Access: Level entry     Home Layout: One level               Home Equipment: Rollator (4 wheels)          Prior Functioning/Environment                          OT Problem List: Pain      OT Treatment/Interventions:      OT Goals(Current goals can be found in the care plan section) Acute Rehab OT  Goals Patient Stated Goal: Hoping to get out of here tomorrow OT Goal Formulation: With patient Time For Goal Achievement: 07/07/21 Potential to Achieve Goals: Good  OT Frequency:      Co-evaluation              AM-PAC OT "6 Clicks" Daily Activity     Outcome Measure Help from another person eating meals?: None Help from another person taking care of personal grooming?: None Help from another person toileting, which includes using toliet, bedpan, or urinal?: None Help from another person bathing (including washing, rinsing, drying)?: None Help from another person to put on and taking off regular upper body clothing?: None Help from another person to put on and taking off regular lower body clothing?: None 6 Click Score: 24   End of Session Equipment Utilized During Treatment: Rollator (4 wheels) Nurse Communication: Mobility status  Activity Tolerance: Patient tolerated treatment well Patient left: in bed;with call bell/phone within reach  OT Visit Diagnosis: Unsteadiness on feet (R26.81)                Time: 6389-3734 OT Time Calculation (min): 18 min Charges:  OT General Charges $OT Visit: 1 Visit OT Evaluation $OT Eval Moderate Complexity: 1 Mod  07/05/2021  RP, OTR/L  Acute Rehabilitation Services  Office:  [phone]   Suzanna Obey 07/05/2021, 2:10 PM

## 2021-07-05 NOTE — Progress Notes (Signed)
FPTS Brief Note Reviewed patient's vitals, recent notes.  Vitals:   07/05/21 1729 07/05/21 2102  BP: (!) 161/119 (!) 148/93  Pulse: 75 65  Resp: 20 16  Temp: 98.3 F (36.8 C) 98.3 F (36.8 C)  SpO2: 98% 100%  Has been persistently hypertensive, most recent BP improved from previous. Plans to start Amlodipine tomorrow.   At this time, no change in plan from day progress note.  Sabino Dick, DO Page [phone] with questions about this patient.

## 2021-07-05 NOTE — Evaluation (Signed)
Physical Therapy Evaluation & Discharge Patient Details Name: Kevin [name] MRN: [id number] DOB: 1970-11-05 Today's Date: 07/05/2021  History of Present Illness  Pt is a 51 y.o. male admitted to [facility] 06/30/21 with significant pneumomediastinum and concern for esophageal perforation; CIWA. Repeat Chest CT 1/20 with almost complete interval clearing of pneumomediastinum; small bilateral pleural effusions. PMH includes polysubstance abuse, HTN, gastritis, anxiety, depression.   Clinical Impression  Patient evaluated by Physical Therapy with no further acute PT needs identified. PTA, pt mod indep with rollator, stays with friend as needed for support. Today, pt mod indep with transfers and ambulation using rollator. All education has been completed and the patient has no further questions. Pt requesting bus pass for d/c home (SW notified). Acute PT is signing off. Thank you for this referral.     Recommendations for follow up therapy are one component of a multi-disciplinary discharge planning process, led by the attending physician.  Recommendations may be updated based on patient status, additional functional criteria and insurance authorization.  Follow Up Recommendations No PT follow up    Assistance Recommended at Discharge None  Patient can return home with the following  Assist for transportation    Equipment Recommendations None recommended by PT  Recommendations for Other Services       Functional Status Assessment       Precautions / Restrictions Precautions Precautions: Fall Restrictions Weight Bearing Restrictions: No      Mobility  Bed Mobility Overal bed mobility: Independent                  Transfers Overall transfer level: Modified independent Equipment used: Rollator (4 wheels)               General transfer comment: opts to not lock rollator brakes despite cues; able to stand from EOB and rollator seat mod indep     Ambulation/Gait Ambulation/Gait assistance: Modified independent (Device/Increase time) Gait Distance (Feet): 320 Feet Assistive device: Rollator (4 wheels) Gait Pattern/deviations: Step-through pattern, Decreased stride length, Trunk flexed Gait velocity: Decreased     General Gait Details: Slow, steady gait mod indep with rollator; intermittent standing rest break to stretch hips, able to do so without UE support  Stairs            Wheelchair Mobility    Modified Rankin (Stroke Patients Only)       Balance Overall balance assessment: Mild deficits observed, not formally tested     Sitting balance - Comments: indep to don socks sitting EOB                                     Pertinent Vitals/Pain Pain Assessment Pain Assessment: Faces Faces Pain Scale: Hurts a little bit Pain Location: bilateral hips Pain Descriptors / Indicators: Constant, Sore Pain Intervention(s): Monitored during session    Home Living Family/patient expects to be discharged to:: Private residence Living Arrangements: Non-relatives/Friends Available Help at Discharge: Friend(s);Available PRN/intermittently Type of Home: House Home Access: Level entry       Home Layout: One level Home Equipment: Rollator (4 wheels) Additional Comments: "I've got a place to stay if needed, she can just be annoying" - has been homeless and slept outside in the past    Prior Function Prior Level of Function : Independent/Modified Independent             Mobility Comments: Mod indep with rollator; reports needing  bilateral hip replacements; will walk or sometimes take bus for transportation       Hand Dominance        Extremity/Trunk Assessment   Upper Extremity Assessment Upper Extremity Assessment: Overall WFL for tasks assessed    Lower Extremity Assessment Lower Extremity Assessment: Overall WFL for tasks assessed (pt reports need for bilateral hip replacements;  functionally >3/5 strength observed)       Communication   Communication: No difficulties  Cognition Arousal/Alertness: Awake/alert Behavior During Therapy: WFL for tasks assessed/performed Overall Cognitive Status: No family/caregiver present to determine baseline cognitive functioning                                 General Comments: Noted decreased attention and talking about unrelated subject matter requiring intermittent redirection; noted h/o polysubstance abuse; suspect near baseline cognition        General Comments General comments (skin integrity, edema, etc.): pt reports he'd like a bus pass for d/c home (friend is available for ride, "but I'd rather do my own thing") - will notify SW    Exercises     Assessment/Plan    PT Assessment Patient does not need any further PT services  PT Problem List         PT Treatment Interventions      PT Goals (Current goals can be found in the Care Plan section)  Acute Rehab PT Goals PT Goal Formulation: All assessment and education complete, DC therapy    Frequency       Co-evaluation               AM-PAC PT "6 Clicks" Mobility  Outcome Measure Help needed turning from your back to your side while in a flat bed without using bedrails?: None Help needed moving from lying on your back to sitting on the side of a flat bed without using bedrails?: None Help needed moving to and from a bed to a chair (including a wheelchair)?: None Help needed standing up from a chair using your arms (e.g., wheelchair or bedside chair)?: None Help needed to walk in hospital room?: None Help needed climbing 3-5 steps with a railing? : None 6 Click Score: 24    End of Session Equipment Utilized During Treatment: Gait belt Activity Tolerance: Patient tolerated treatment well Patient left: with call bell/phone within reach Nurse Communication: Mobility status (RN ok pt mobilizing independently) PT Visit Diagnosis: Other  abnormalities of gait and mobility (R26.89)    Time: 2353-6144 PT Time Calculation (min) (ACUTE ONLY): 20 min   Charges:   PT Evaluation $PT Eval Low Complexity: 1 Low $PT Eval Moderate Complexity: 1 Mod        Ina Homes, PT, DPT Acute Rehabilitation Services  Pager [phone] Office [phone]  Malachy Chamber 07/05/2021, 9:52 AM

## 2021-07-06 DIAGNOSIS — R748 Abnormal levels of other serum enzymes: Secondary | ICD-10-CM

## 2021-07-06 LAB — COMPREHENSIVE METABOLIC PANEL
ALT: 170 U/L — ABNORMAL HIGH (ref 0–44)
AST: 217 U/L — ABNORMAL HIGH (ref 15–41)
Albumin: 2.5 g/dL — ABNORMAL LOW (ref 3.5–5.0)
Alkaline Phosphatase: 96 U/L (ref 38–126)
Anion gap: 8 (ref 5–15)
BUN: 11 mg/dL (ref 6–20)
CO2: 20 mmol/L — ABNORMAL LOW (ref 22–32)
Calcium: 8.3 mg/dL — ABNORMAL LOW (ref 8.9–10.3)
Chloride: 105 mmol/L (ref 98–111)
Creatinine, Ser: 0.84 mg/dL (ref 0.61–1.24)
GFR, Estimated: >60 mL/min (ref >60)
Glucose, Bld: 128 mg/dL — ABNORMAL HIGH (ref 70–99)
Potassium: 3.4 mmol/L — ABNORMAL LOW (ref 3.5–5.1)
Sodium: 133 mmol/L — ABNORMAL LOW (ref 135–145)
Total Bilirubin: 0.7 mg/dL (ref 0.3–1.2)
Total Protein: 6.5 g/dL (ref 6.5–8.1)

## 2021-07-06 LAB — CBC
HCT: 31.8 % — ABNORMAL LOW (ref 39.0–52.0)
Hemoglobin: 10.7 g/dL — ABNORMAL LOW (ref 13.0–17.0)
MCH: 31.8 pg (ref 26.0–34.0)
MCHC: 33.6 g/dL (ref 30.0–36.0)
MCV: 94.6 fL (ref 80.0–100.0)
Platelets: 290 10*3/uL (ref 150–400)
RBC: 3.36 MIL/uL — ABNORMAL LOW (ref 4.22–5.81)
RDW: 13.1 % (ref 11.5–15.5)
WBC: 6.8 10*3/uL (ref 4.0–10.5)
nRBC: 0 % (ref 0.0–0.2)

## 2021-07-06 LAB — HEPATITIS B SURFACE ANTIBODY, QUANTITATIVE: Hep B S AB Quant (Post): <3.1 m[IU]/mL — ABNORMAL LOW (ref >9.9)

## 2021-07-06 MED ORDER — POTASSIUM CHLORIDE 20 MEQ PO PACK
40.0000 meq | PACK | Freq: Once | ORAL | Status: AC
  Administered 2021-07-06: 40 meq via ORAL
  Filled 2021-07-06: qty 2

## 2021-07-06 NOTE — Progress Notes (Addendum)
Family Medicine Teaching Service Daily Progress Note Intern Pager: [phone]  Patient name: Kevin [name] Medical record number: [id number] Date of birth: 08-29-1970 Age: 51 y.o. Gender: male  Primary Care Provider: Marliss Coots, NP Consultants: CCM, CT surgery, GI s/o Code Status: Full  Pt Overview and Major Events to Date:  1/16 [facility] ED 1/17 admit to [facility] 1/18 GI advance diet to liquids  Assessment and Plan:  Jolene Provost. Eyer is a 51 year old male presented with difficulty swallowing, hematemesis, globus sensation, swelling of his neck and found to have significant pneumomediastinum and concern for esophageal perforation.  PMH significant for alcohol abuse, cocaine use, marijuana use, anxiety depression, HTN, history of gastritis.  Pneumomediastinum Patient tolerating dysphagia 3 diet well. WBC 6.8 today. Hemoglobin 10.7 this morning from 11.5.  Remained afebrile and saturating well on room air. -Advance to regular diet today -Tylenol 650 mg every 6 hours as needed -Zofran 4 mg every 6 hours as needed -Augmentin ending today  Neck and submandibular swelling Remains nontender to palpation, submandibular node still enlarged likely secondary to parotitis. -Monitor  Alcohol use   transaminitis Worsening to 217/170 for AST/ALT 173/118.  Hep panel was negative. Hep B surface ab pending. Liver ultrasound showed heterogenous echogenic liver consistent with hepatic steatosis and hepatocellular disease. Also had gallbladder with mild diffuse increased wall thickness.  Discussed with pharmacy about possibility of Augmentin causing transaminitis.  Pharmacy and ID pharmacy believes that transaminitis is more likely due to AST/ALT given Augmentin transaminitis seen at a later period (weeks).  They said that it was okay to give last dose of Augmentin today. -MVI, thiamine, folic acid -TOC consult -CMP  HTN Blood pressure ranges have been 133-169 / 87-119. -Amlodipine 10 mg  today -Clonidine 0.1 mg weekly -Losartan 25 mg today -Monitor  Gastritis -Continue Protonix twice daily  Hypokalemia Potassium 0.4 this morning. -Repleted 40 mEq packet -monitor  SDOH Patient homeless and living at a laundromat. -TOC/social work consult for assistance  Stable/Chronic Urinary frequency   hematuria Polysubstance abuse  FEN/GI: Regular diet PPx: SCDs Dispo: Pending social work/TOC and regular diet  Subjective:  This morning patient had questions about his abdominal imaging and chest imaging which I discussed with him about.  Says that his pain when swallowing is getting better.  Did not have any acute issues overnight  Objective: Temp:  [98 F (36.7 C)-98.3 F (36.8 C)] 98.3 F (36.8 C) (01/22 0448) Pulse Rate:  [63-96] 63 (01/22 0448) Resp:  [14-20] 14 (01/22 0448) BP: (133-167)/(87-119) 157/103 (01/22 0448) SpO2:  [98 %-100 %] 100 % (01/22 0448) Physical Exam: General: NAD, laying in bed, muffled voice improving Cardiovascular: RRR no murmurs rubs or gallops Respiratory: Clear to auscultation bilaterally no wheezes rales or crackles Abdomen: Distended, nontender to palpation Extremities: Moves freely, no edema  Laboratory: Recent Labs  Lab 07/04/21 0145 07/05/21 0202 07/06/21 0105  WBC 8.5 7.9 6.8  HGB 11.8* 11.5* 10.7*  HCT 34.9* 32.6* 31.8*  PLT 268 268 290   Recent Labs  Lab 07/04/21 0145 07/05/21 0202 07/06/21 0105  NA 131* 130* 133*  K 3.4* 3.4* 3.4*  CL 100 103 105  CO2 20* 21* 20*  BUN 7 7 11   CREATININE 0.81 0.86 0.84  CALCIUM 8.1* 8.0* 8.3*  PROT 7.0 6.6 6.5  BILITOT 1.3* 0.6 0.7  ALKPHOS 87 86 96  ALT 85* 118* 170*  AST 157* 173* 217*  GLUCOSE 82 110* 128*      Imaging/Diagnostic Tests:  Gerrit Heck, MD 07/06/2021, 7:07 AM PGY-1, [place] Intern pager: [phone], text pages welcome

## 2021-07-06 NOTE — Social Work (Addendum)
CSW met with pt in regards to Substance Use resources and homeless resources. Pt explained that he is currently living with a friend Dawn and he can return to staying with her. Pt's reculations were inconsistent due to him mentioning them living in a laundry mat. Pt explained that he is open to inpt rehab however he will need to first get his SSI benefits figured out due to him being in danger of losing them.  Pt stated that he has been to [facility], [facility] and the [facility] in the past. Pt explains his desire to quit drinking due to it having consequences with his health. CSW processed with his current desire to change some aspect of his current situation. Pt stated that he would need to go to rehab because he has tried quitting on his own and has been unsuccessful.  CSW completed a CAGE-AID with pt and provided him SU resources as well as homeless resources. CSW informed pt that the number to [facility] would be on his AVS as will. Pt was agreeable to accepting the resources and stated that he would follow up with them as he gets his SSI figured out.  TOC team will continue to assist with discharge planning needs.

## 2021-07-06 NOTE — Progress Notes (Addendum)
Family Medicine Teaching Service Daily Progress Note Intern Pager: [phone]  Patient name: Kevin [name] Medical record number: [id number] Date of birth: 15-Sep-1970 Age: 51 y.o. Gender: male  Primary Care Provider: Marliss Coots, NP Consultants: CCM, CT surgery, GI all signed off Code Status: Full  Pt Overview and Major Events to Date:  1/16 [place] [facility] ED 1/17 admitted to [facility] 1/18 GI advance diet to liquids 1/20 CT chest complete interval clearing of pneumomediastinum 1/22 advance to regular diet  Assessment and Plan:  Kevin [name] is a 51 year old male presenting with difficulty swallowing, hematemesis, globus sensation, swelling of his neck found to have significant pneumomediastinum and concern for esophageal perforation.  Past medical history significant for alcohol abuse, cocaine use, marijuana use, anxiety, depression, hypertension, history of gastritis.  Pneumomediastinum Patient tolerating regular diet well.  WBC today is 7.3.  Hemoglobin is 11.0 from 10.7 yesterday.  Has remained afebrile and saturating well on room air.  Completed Augmentin course yesterday. -Regular diet -Tylenol 650 mg every 6 hours as needed -Zofran 4 mg every 6 hours as needed  Neck and submandibular swelling Remains nontender to palpation with reduced swelling. Submandibular nodes still enlarged likely secondary to parotitis from recurrent vomiting. -Monitor  Alcohol use   transaminitis Liver enzymes AST/ALT are 249/219 from 217/178 yesterday.  Denies any abdominal pain.  TOC discussed options with patient, he is interested in inpatient substance abuse rehab and has to get his SSI sorted before but is comfortable being set up with that outpatient. -MV, thiamine, folic acid -TOC consult -consider starting naltrexone outpatient -GI outpatient  HTN Blood pressure ranges have been 138 to 155/ 94-126. -Amlodipine 10 mg -Clonidine 0.1 mg weekly -Losartan 25 mg  History of  gastritis -Continue Protonix twice daily  Hypokalemia Potassium was 3.6 this morning -Repleted with 40 meq packet -Monitor  Social determinants of health TOC has worked with patient about homelessness and alcohol/substance use. AVS has resources about [facility].  -Appreciate social work  Stable/chronic Urinary frequency   hematuria Polysubstance abuse  FEN/GI: Regular diet PPx: SCDs Dispo: Home likely today needs PCP  Subjective:  Had long discussion with patient about alcohol cessation, patient is interested in medication for cravings.  He said he is interested in getting a job at [employer] which he is staying at.  Says that his social situation and his the main reason why he is relapsing.  Objective: Temp:  [98.2 F (36.8 C)-98.3 F (36.8 C)] 98.3 F (36.8 C) (01/22 1612) Pulse Rate:  [63-98] 80 (01/22 1612) Resp:  [14-21] 21 (01/22 1612) BP: (141-159)/(87-126) 150/101 (01/22 1612) SpO2:  [96 %-100 %] 96 % (01/22 1612) Physical Exam: General: NAD, standing up with walker, no muffled voice Neck: Submandibular node enlargement, neck swelling has decreased significantly Cardiovascular: RRR no murmurs rubs or gallops Respiratory: Clear to auscultation bilaterally no wheezes rales or crackles Abdomen: Distended, nontender to palpation Extremities: No lower extremity edema  Laboratory: Recent Labs  Lab 07/04/21 0145 07/05/21 0202 07/06/21 0105  WBC 8.5 7.9 6.8  HGB 11.8* 11.5* 10.7*  HCT 34.9* 32.6* 31.8*  PLT 268 268 290   Recent Labs  Lab 07/04/21 0145 07/05/21 0202 07/06/21 0105  NA 131* 130* 133*  K 3.4* 3.4* 3.4*  CL 100 103 105  CO2 20* 21* 20*  BUN 7 7 11   CREATININE 0.81 0.86 0.84  CALCIUM 8.1* 8.0* 8.3*  PROT 7.0 6.6 6.5  BILITOT 1.3* 0.6 0.7  ALKPHOS 87 86 96  ALT  85* 118* 170*  AST 157* 173* 217*  GLUCOSE 82 110* 128*     Imaging/Diagnostic Tests:   Gerrit Heck, MD 07/06/2021, 6:17 PM PGY-1, [place]  Intern pager: [phone], text pages welcome

## 2021-07-06 NOTE — Progress Notes (Signed)
FPTS Brief Note Reviewed patient's vitals, recent notes.  Vitals:   07/06/21 1612 07/06/21 2100  BP: (!) 150/101 (!) 138/99  Pulse: 80 80  Resp: (!) 21 18  Temp: 98.3 F (36.8 C) 98.3 F (36.8 C)  SpO2: 96% 100%   At this time, no change in plan from day progress note.  Derrel Nip, MD Page [phone] with questions about this patient.

## 2021-07-07 ENCOUNTER — Other Ambulatory Visit (HOSPITAL_COMMUNITY): Payer: Self-pay

## 2021-07-07 LAB — CBC
HCT: 31.9 % — ABNORMAL LOW (ref 39.0–52.0)
Hemoglobin: 11 g/dL — ABNORMAL LOW (ref 13.0–17.0)
MCH: 32.6 pg (ref 26.0–34.0)
MCHC: 34.5 g/dL (ref 30.0–36.0)
MCV: 94.7 fL (ref 80.0–100.0)
Platelets: 297 10*3/uL (ref 150–400)
RBC: 3.37 MIL/uL — ABNORMAL LOW (ref 4.22–5.81)
RDW: 13.2 % (ref 11.5–15.5)
WBC: 7.3 10*3/uL (ref 4.0–10.5)
nRBC: 0 % (ref 0.0–0.2)

## 2021-07-07 LAB — COMPREHENSIVE METABOLIC PANEL
ALT: 219 U/L — ABNORMAL HIGH (ref 0–44)
AST: 249 U/L — ABNORMAL HIGH (ref 15–41)
Albumin: 2.6 g/dL — ABNORMAL LOW (ref 3.5–5.0)
Alkaline Phosphatase: 89 U/L (ref 38–126)
Anion gap: 10 (ref 5–15)
BUN: 12 mg/dL (ref 6–20)
CO2: 18 mmol/L — ABNORMAL LOW (ref 22–32)
Calcium: 8.5 mg/dL — ABNORMAL LOW (ref 8.9–10.3)
Chloride: 102 mmol/L (ref 98–111)
Creatinine, Ser: 0.89 mg/dL (ref 0.61–1.24)
GFR, Estimated: >60 mL/min (ref >60)
Glucose, Bld: 117 mg/dL — ABNORMAL HIGH (ref 70–99)
Potassium: 3.6 mmol/L (ref 3.5–5.1)
Sodium: 130 mmol/L — ABNORMAL LOW (ref 135–145)
Total Bilirubin: 0.4 mg/dL (ref 0.3–1.2)
Total Protein: 6.6 g/dL (ref 6.5–8.1)

## 2021-07-07 MED ORDER — PANTOPRAZOLE SODIUM 40 MG PO TBEC
40.0000 mg | DELAYED_RELEASE_TABLET | Freq: Every day | ORAL | 0 refills | Status: DC
  Filled 2021-07-07: qty 30, 30d supply, fill #0

## 2021-07-07 MED ORDER — CLONIDINE 0.1 MG/24HR TD PTWK
0.1000 mg | MEDICATED_PATCH | TRANSDERMAL | 12 refills | Status: DC
  Filled 2021-07-07: qty 4, 28d supply, fill #0

## 2021-07-07 MED ORDER — LOSARTAN POTASSIUM 25 MG PO TABS
25.0000 mg | ORAL_TABLET | Freq: Every day | ORAL | 0 refills | Status: DC
  Filled 2021-07-07: qty 30, 30d supply, fill #0

## 2021-07-07 MED ORDER — THIAMINE HCL 100 MG PO TABS
100.0000 mg | ORAL_TABLET | Freq: Every day | ORAL | 0 refills | Status: DC
  Filled 2021-07-07: qty 30, 30d supply, fill #0

## 2021-07-07 MED ORDER — POTASSIUM CHLORIDE 20 MEQ PO PACK
40.0000 meq | PACK | Freq: Once | ORAL | Status: AC
  Administered 2021-07-07: 40 meq via ORAL
  Filled 2021-07-07: qty 2

## 2021-07-07 MED ORDER — ONDANSETRON HCL 4 MG PO TABS
4.0000 mg | ORAL_TABLET | Freq: Three times a day (TID) | ORAL | 0 refills | Status: DC | PRN
  Filled 2021-07-07: qty 20, 7d supply, fill #0

## 2021-07-07 MED ORDER — FOLIC ACID 1 MG PO TABS
1.0000 mg | ORAL_TABLET | Freq: Every day | ORAL | 0 refills | Status: DC
  Filled 2021-07-07: qty 30, 30d supply, fill #0

## 2021-07-07 MED ORDER — SUCRALFATE 1 GM/10ML PO SUSP
1.0000 g | Freq: Three times a day (TID) | ORAL | 0 refills | Status: DC
  Filled 2021-07-07: qty 420, 11d supply, fill #0

## 2021-07-07 MED ORDER — AMLODIPINE BESYLATE 10 MG PO TABS
10.0000 mg | ORAL_TABLET | Freq: Every day | ORAL | 0 refills | Status: DC
  Filled 2021-07-07: qty 30, 30d supply, fill #0

## 2021-07-07 MED ORDER — CERTAVITE/ANTIOXIDANTS PO TABS
1.0000 | ORAL_TABLET | Freq: Every day | ORAL | 0 refills | Status: DC
  Filled 2021-07-07: qty 30, 30d supply, fill #0

## 2021-07-07 NOTE — Progress Notes (Signed)
Patient is taking a shower.

## 2021-07-07 NOTE — Discharge Instructions (Signed)
Dear Kevin [name],   Thank you so much for allowing [place] to be part of your care!  You were admitted to [facility] for air in your chest called pneumomediastinum and concern for a tear of your esophagus. This cleared up on your imaging. You also had some liver damage seen on an abdominal scan. You were put on antibiotics and slowly brought up to a regular diet and doing well on that.   POST-HOSPITAL & CARE INSTRUCTIONS Please follow up with GI doctor about your liver enzymes Please stop drinking alcohol and go to [facility] as soon as you can Please let PCP/Specialists know of any changes that were made.  Please see medications section of this packet for any medication changes.   DOCTOR'S APPOINTMENT & FOLLOW UP CARE INSTRUCTIONS  Future Appointments  Date Time Provider [place]  07/09/2021  3:10 PM Charlott Rakes, MD [facility] None    RETURN PRECAUTIONS: If you are having similar symptoms that brought you in where you are unable to breath well, have sudden chest pain or issues swallowing  Take care and be well!  [facility]  [place], [place] [address] [phone]

## 2021-07-07 NOTE — TOC Transition Note (Signed)
Transition of Care (TOC) - CM/SW Discharge Note Donn Pierini RN, BSN Transitions of Care Unit 4E- RN Case Manager See Treatment Team for direct phone #    Patient Details  Name: Kevin [name] MRN: [id number] Date of Birth: [birth year]  [facility] CM/SW Contact:  Darrold Span, RN Phone Number: 07/07/2021, 2:14 PM   Clinical Narrative:    Pt stable for transition home today. CM spoke with pt at bedside to discuss PCP needs. Per pt he used to go to the [facility] and see Lavinia Sharps but reports that after he got disability he was told he should find somewhere else for primary care needs and he has not established anywhere yet.  Discussed options of different clinics and what might be easiest as far as location for pt to get to. Pt agreeable to try either [facility] or [facility]. Pt has cell phone- # confirmed [phone].   Call made to [facility]- they have an available appointment for this week- !/25 at 3:10- pt agreeable to this appointment and appreciative for assistance in finding primary care to follow up with. Appointment has been placed on AVS for pt.      Final next level of care: Home/Self Care Barriers to Discharge: No Barriers Identified   Patient Goals and CMS Choice Patient states their goals for this hospitalization and ongoing recovery are:: return home, take better care of myself   Choice offered to / list presented to : NA  Discharge Placement               Home        Discharge Plan and Services In-house Referral: Clinical Social Work Discharge Planning Services: Follow-up appt scheduled, [facility] Post Acute Care Choice: NA          DME Arranged: N/A DME Agency: NA       HH Arranged: NA HH Agency: NA        Social Determinants of Health (SDOH) Interventions     Readmission Risk Interventions Readmission Risk Prevention Plan 07/07/2021  Post Dischage Appt Complete  Medication Screening Complete   Transportation Screening Complete  Some recent data might be hidden

## 2021-07-07 NOTE — Progress Notes (Deleted)
Family Medicine Teaching [facility] Discharge Summary  Patient name: Kevin [name] Medical record number: [id number] Date of birth: 05-25-1971 Age: 51 y.o. Gender: male Date of Admission: 06/30/2021  Date of Discharge: 07/07/2021 Admitting Physician: Levin Erp, MD  Primary Care Provider: Lavinia Sharps, NP Consultants: CCM, CT surgery, GI  Indication for Hospitalization: Pneumomediastinum  Discharge Diagnoses/Problem List:   Pneumomediastinum Polysubstance use disorder Hypertension Leukocytosis Hematemesis Esophageal perforation Elevated liver enzymes  Disposition: Home  Discharge Condition: Stable  Discharge Exam:   General: NAD, standing up with walker, no muffled voice Neck: Submandibular node enlargement, neck swelling has decreased significantly, non-tender to palpation, no crepitus Cardiovascular: RRR no murmurs rubs or gallops, 2+ pulses Respiratory: Clear to auscultation bilaterally no wheezes rales or crackles Abdomen: Distended, nontender to palpation Extremities: No lower extremity edema   Brief Hospital Course:  Pneumomediastinum 2/2 to Presumed Esophageal Tear Patient being admitted for pneumomediastinum with concern for esophageal perforation. Patient had muffled voice, globus sensation, difficulty swallowing, swelling of his neck and hematemesis after drinking a bottle of liquor before admission.  CT with contrast showed extensive soft tissue emphysema tracking throughout the neck and visualized mediastinum most pronounced around the esophagus. Esophagogram showed no evidence of esophageal perforation or mucosal irregularity and mild esophageal dysmotility with a small hiatal hernia with mild GERD. CT surgery saw patient and recommended no surgery as it was not a  full-thickness injury on esophagram. Critical care was consulted as well and recommended patient remaining n.p.o. and was started on dextrose containing fluids. GI was consulted and  recommended carafate, pantoprazole IV and zofran to prevent vomiting. GI did not recommend EGD given risk of esophageal perforation/full thickness tear. Repeat CT at end of hospitalization showed almost complete clearing of pneumomediastinum. Few scattered small pockets of air in mediastinum and R side of neck. No evidence of significant air in esophageal wall . His diet was advanced per GI and by end of hospitalization was tolerating a regular diet well.   Neck swelling   submandibular swelling Likely secondary to parotitis from alcohol use. No abscess seen on CT/examinations.   Alcohol Withdrawal   Transaminitis Started on CIWA protocol and basal phenobarbital taper. Has history of severe withdrawals and PCCM started patient on basal phenobarbital taper in ED. Pateint remained stable on the floor. Liver ultrasound obtained due to transaminitis showed heterogenous echogenic liver consistent with hepatic steatosis and hepatocellular disease. Did not have abdominal pain during hospitalization. AST/ALT at end of discharge was 249/219. Most likely 2/2 to alcohol use. Possible component of augmentin/abx worsening these numbers in hospital but ID pharmacist said less likely due to this as this would more likely be weeks after the antibiotics and is rare.  Hypertension Does not take any antihypertensive at home. Started on clonidine patch as well as Losartan and Amlodipine.   Polysubstance abuse   Social determinants of health Attests to cocaine use. UDS positive for opiates, cocaine, THC. TOC was consulted for substance abuse resources. Patient also is housing insecure and currently living in a laundromat.   Issues for Follow Up:  Recheck his Hepatitis panel at PCP visit,  referral to a hepatologist for transaminitis F/u on blood pressure. We started Norvasc 10 mg, Losartan 25 mg QD, and Clonidine patch 0.1 mg weekly Consider starting naltrexone outpatient for alcohol use Ensure patient follows up with  [facility] for alcohol/substance use  Consider ENT referral for dysphagia in the setting of pneumoediastinum likely 2/2 esophageal tear  Significant Procedures: Esophagram  Significant Labs  and Imaging:  Recent Labs  Lab 07/05/21 0202 07/06/21 0105 07/07/21 0159  WBC 7.9 6.8 7.3  HGB 11.5* 10.7* 11.0*  HCT 32.6* 31.8* 31.9*  PLT 268 290 297   Recent Labs  Lab 07/03/21 0441 07/04/21 0145 07/05/21 0202 07/06/21 0105 07/07/21 0159  NA 134* 131* 130* 133* 130*  K 3.2* 3.4* 3.4* 3.4* 3.6  CL 105 100 103 105 102  CO2 22 20* 21* 20* 18*  GLUCOSE 94 82 110* 128* 117*  BUN 12 7 7 11 12   CREATININE 0.92 0.81 0.86 0.84 0.89  CALCIUM 7.4* 8.1* 8.0* 8.3* 8.5*  ALKPHOS 75 87 86 96 89  AST 83* 157* 173* 217* 249*  ALT 45* 85* 118* 170* 219*  ALBUMIN 2.3* 2.6* 2.4* 2.5* 2.6*    Results/Tests Pending at Time of Discharge: None  Discharge Medications:  Allergies as of 07/07/2021   No Known Allergies      Medication List     TAKE these medications    amLODipine 10 MG tablet Commonly known as: NORVASC Take 1 tablet (10 mg total) by mouth daily. Start taking on: July 08, 2021   CertaVite/Antioxidants Tabs Take 1 tablet by mouth daily. Start taking on: July 08, 2021   cloNIDine 0.1 mg/24hr patch Commonly known as: CATAPRES - Dosed in mg/24 hr Place 1 patch (0.1 mg total) onto the skin once a week. Start taking on: July 09, 2021   folic acid 1 MG tablet Commonly known as: FOLVITE Take 1 tablet (1 mg total) by mouth daily. Start taking on: July 08, 2021   ibuprofen 200 MG tablet Commonly known as: ADVIL Take 800 mg by mouth every 6 (six) hours as needed for moderate pain.   losartan 25 MG tablet Commonly known as: COZAAR Take 1 tablet (25 mg total) by mouth daily. Start taking on: July 08, 2021   ondansetron 4 MG tablet Commonly known as: Zofran Take 1 tablet (4 mg total) by mouth every 8 (eight) hours as needed for nausea or vomiting.    pantoprazole 40 MG tablet Commonly known as: PROTONIX Take 1 tablet (40 mg total) by mouth daily.   sucralfate 1 GM/10ML suspension Commonly known as: CARAFATE Take 10 mLs (1 g total) by mouth 4 (four) times daily -  with meals and at bedtime.   thiamine 100 MG tablet Take 1 tablet (100 mg total) by mouth daily.        Discharge Instructions: Please refer to Patient Instructions section of EMR for full details.  Patient was counseled important signs and symptoms that should prompt return to medical care, changes in medications, dietary instructions, activity restrictions, and follow up appointments.   Follow-Up Appointments:  Follow-up Information     Services, [facility]. Schedule an appointment as soon as possible for a visit.   Why: Please follow up with [facility] once ready for services Contact information: [address] [place] [phone]         [facility]. Go on 07/09/2021.   Why: they recently moved-Address is now- [address]- phone # is the same- your appointment is at 3:10pm- please arrive 15 min early and bring your insurance card. Contact information: [address] [place] [phone]                [phone], MD 07/07/2021, 2:05 PM PGY-1, [facility]

## 2021-07-07 NOTE — Progress Notes (Signed)
Discharge instructions (including medications) discussed with and copy provided to patient/caregiver 

## 2021-07-07 NOTE — Plan of Care (Signed)
  Problem: Education: Goal: Knowledge of General Education information will improve Description: Including pain rating scale, medication(s)/side effects and non-pharmacologic comfort measures Outcome: Adequate for Discharge   

## 2021-07-07 NOTE — Discharge Summary (Signed)
Family Medicine Teaching [facility] Discharge Summary   Patient name: Kevin [name]    Medical record number: [id number] Date of birth: 1970-10-10        Age: 51 y.o.    Gender: male Date of Admission: 06/30/2021                      Date of Discharge: 07/07/2021 Admitting Physician: Levin ErpMayuri Shelsie Tijerino, MD   Primary Care Provider: Lavinia SharpsPlacey, Mary Ann, NP Consultants: CCM, CT surgery, GI   Indication for Hospitalization: Pneumomediastinum   Discharge Diagnoses/Problem List:    Pneumomediastinum Polysubstance use disorder Hypertension Leukocytosis Hematemesis Esophageal perforation Elevated liver enzymes   Disposition: Home   Discharge Condition: Stable   Discharge Exam:    General: NAD, standing up with walker, no muffled voice Neck: Submandibular node enlargement, neck swelling has decreased significantly, non-tender to palpation, no crepitus Cardiovascular: RRR no murmurs rubs or gallops, 2+ pulses Respiratory: Clear to auscultation bilaterally no wheezes rales or crackles Abdomen: Distended, nontender to palpation Extremities: No lower extremity edema   Brief Hospital Course:  Pneumomediastinum 2/2 to Presumed Esophageal Tear Patient being admitted for pneumomediastinum with concern for esophageal perforation. Patient had muffled voice, globus sensation, difficulty swallowing, swelling of his neck and hematemesis after drinking a bottle of liquor before admission.  CT with contrast showed extensive soft tissue emphysema tracking throughout the neck and visualized mediastinum most pronounced around the esophagus. Esophagogram showed no evidence of esophageal perforation or mucosal irregularity and mild esophageal dysmotility with a small hiatal hernia with mild GERD. CT surgery saw patient and recommended no surgery as it was not a  full-thickness injury on esophagram. Critical care was consulted as well and recommended patient remaining n.p.o. and was started on dextrose  containing fluids. GI was consulted and recommended carafate, pantoprazole IV and zofran to prevent vomiting. GI did not recommend EGD given risk of esophageal perforation/full thickness tear. Repeat CT at end of hospitalization showed almost complete clearing of pneumomediastinum. Few scattered small pockets of air in mediastinum and R side of neck. No evidence of significant air in esophageal wall . His diet was advanced per GI and by end of hospitalization was tolerating a regular diet well.    Neck swelling   submandibular swelling Likely secondary to parotitis from alcohol use. No abscess seen on CT/examinations.    Alcohol Withdrawal   Transaminitis Started on CIWA protocol and basal phenobarbital taper. Has history of severe withdrawals and PCCM started patient on basal phenobarbital taper in ED. Pateint remained stable on the floor. Liver ultrasound obtained due to transaminitis showed heterogenous echogenic liver consistent with hepatic steatosis and hepatocellular disease. Did not have abdominal pain during hospitalization. AST/ALT at end of discharge was 249/219. Most likely 2/2 to alcohol use. Possible component of augmentin/abx worsening these numbers in hospital but ID pharmacist said less likely due to this as this would more likely be weeks after the antibiotics and is rare.   Hypertension Does not take any antihypertensive at home. Started on clonidine patch as well as Losartan and Amlodipine.    Polysubstance abuse   Social determinants of health Attests to cocaine use. UDS positive for opiates, cocaine, THC. TOC was consulted for substance abuse resources. Patient also is housing insecure and currently living in a laundromat.     Issues for Follow Up:  Recheck his Hepatitis panel at PCP visit,  referral to a hepatologist for transaminitis F/u on blood pressure. We started Norvasc 10 mg, Losartan  25 mg QD, and Clonidine patch 0.1 mg weekly Consider starting naltrexone outpatient  for alcohol use Ensure patient follows up with [facility] for alcohol/substance use  Consider ENT referral for dysphagia in the setting of pneumoediastinum likely 2/2 esophageal tear   Significant Procedures: Esophagram   Significant Labs and Imaging:  Last Labs        Recent Labs  Lab 07/05/21 0202 07/06/21 0105 07/07/21 0159  WBC 7.9 6.8 7.3  HGB 11.5* 10.7* 11.0*  HCT 32.6* 31.8* 31.9*  PLT 268 290 297      Last Labs          Recent Labs  Lab 07/03/21 0441 07/04/21 0145 07/05/21 0202 07/06/21 0105 07/07/21 0159  NA 134* 131* 130* 133* 130*  K 3.2* 3.4* 3.4* 3.4* 3.6  CL 105 100 103 105 102  CO2 22 20* 21* 20* 18*  GLUCOSE 94 82 110* 128* 117*  BUN 12 7 7 11 12   CREATININE 0.92 0.81 0.86 0.84 0.89  CALCIUM 7.4* 8.1* 8.0* 8.3* 8.5*  ALKPHOS 75 87 86 96 89  AST 83* 157* 173* 217* 249*  ALT 45* 85* 118* 170* 219*  ALBUMIN 2.3* 2.6* 2.4* 2.5* 2.6*        Results/Tests Pending at Time of Discharge: None   Discharge Medications:  Allergies as of 07/07/2021   No Known Allergies         Medication List       TAKE these medications     amLODipine 10 MG tablet Commonly known as: NORVASC Take 1 tablet (10 mg total) by mouth daily. Start taking on: July 08, 2021    CertaVite/Antioxidants Tabs Take 1 tablet by mouth daily. Start taking on: July 08, 2021    cloNIDine 0.1 mg/24hr patch Commonly known as: CATAPRES - Dosed in mg/24 hr Place 1 patch (0.1 mg total) onto the skin once a week. Start taking on: July 09, 2021    folic acid 1 MG tablet Commonly known as: FOLVITE Take 1 tablet (1 mg total) by mouth daily. Start taking on: July 08, 2021    ibuprofen 200 MG tablet Commonly known as: ADVIL Take 800 mg by mouth every 6 (six) hours as needed for moderate pain.    losartan 25 MG tablet Commonly known as: COZAAR Take 1 tablet (25 mg total) by mouth daily. Start taking on: July 08, 2021    ondansetron 4 MG tablet Commonly known as:  Zofran Take 1 tablet (4 mg total) by mouth every 8 (eight) hours as needed for nausea or vomiting.    pantoprazole 40 MG tablet Commonly known as: PROTONIX Take 1 tablet (40 mg total) by mouth daily.    sucralfate 1 GM/10ML suspension Commonly known as: CARAFATE Take 10 mLs (1 g total) by mouth 4 (four) times daily -  with meals and at bedtime.    thiamine 100 MG tablet Take 1 tablet (100 mg total) by mouth daily.             Discharge Instructions: Please refer to Patient Instructions section of EMR for full details.  Patient was counseled important signs and symptoms that should prompt return to medical care, changes in medications, dietary instructions, activity restrictions, and follow up appointments.    Follow-Up Appointments:   Follow-up Information       Services, [facility]. Schedule an appointment as soon as possible for a visit.   Why: Please follow up with [facility] once ready for services Contact information: [address] [place] [address] [phone]              [facility]. Go on 07/09/2021.   Why: they recently moved-Address is now- [address]- phone # is the same- your appointment is at 3:10pm- please arrive 15 min early and bring your insurance card. Contact information: [address] [place] [address] [phone]                        Levin Erp, MD 07/07/2021, 2:05 PM PGY-1, [facility]

## 2021-07-09 ENCOUNTER — Encounter: Payer: Self-pay | Admitting: Family Medicine

## 2021-07-09 ENCOUNTER — Ambulatory Visit: Payer: Medicaid Other | Attending: Family Medicine | Admitting: Family Medicine

## 2021-07-09 ENCOUNTER — Other Ambulatory Visit: Payer: Self-pay

## 2021-07-09 DIAGNOSIS — I10 Essential (primary) hypertension: Secondary | ICD-10-CM

## 2021-07-09 DIAGNOSIS — F10229 Alcohol dependence with intoxication, unspecified: Secondary | ICD-10-CM

## 2021-07-09 DIAGNOSIS — R748 Abnormal levels of other serum enzymes: Secondary | ICD-10-CM

## 2021-07-09 NOTE — Progress Notes (Signed)
Virtual Visit via Telephone Note  I connected with Kevin [name], on 07/09/2021 at 3:03 PM by telephone due to the COVID-19 pandemic and verified that I am speaking with the correct person using two identifiers.   Consent: I discussed the limitations, risks, security and privacy concerns of performing an evaluation and management service by telephone and the availability of in person appointments. I also discussed with the patient that there may be a patient responsible charge related to this service. The patient expressed understanding and agreed to proceed.   Location of Patient: Home  Location of Provider: Home Office   Persons participating in Telemedicine visit: [name] Dr. Alvis Lemmings     History of Present Illness: Kevin [name] is a 51 y.o. year old male with history of alcohol abuse, hypertension who was hospitalized from 06/30/2021 through 07/07/2021 for pneumoperitoneum, dysphagia in the setting of alcohol abuse.  On admission there was concern for esophageal perforation but esophagogram revealed no evidence of esophageal perforation. CT scan of the chest on admission revealed: IMPRESSION: 1. Stable findings consistent with esophageal perforation with marked severity pneumatosis along the length of the esophagus and moderate to marked severity pneumomediastinum. 2. Marked severity soft tissue emphysema throughout the neck. 3. Mild posterior right basilar atelectasis. 4. Small hiatal hernia  DG esophagus: IMPRESSION: 1. No evidence of esophageal perforation or mucosal irregularity. Consider follow-up chest CT with oral contrast in 2-3 days given the esophageal pneumatosis and pneumomediastinum on CT. 2. Mild esophageal dysmotility with a small hiatal hernia and mild gastroesophageal reflux.     During his hospital course he was evaluated by cardiothoracic surgery with no recommendation for surgical procedure.  He was placed on PPI, antiemetic  and maintained on the CIWA protocol. Labs also reveal transaminitis thought to be secondary to his alcohol abuse.  His condition improved. CT from 07/04/21 at discharge revealed: IMPRESSION: There is almost complete interval clearing of pneumomediastinum. There are few scattered residual tiny pockets of air in mediastinum and right side of the neck. There is no evidence of any significant air in the esophageal wall. There are no inflammatory changes or loculated fluid collections in the posterior mediastinum.   Small bilateral pleural effusions. Small linear densities in the posterior lower lung fields may suggest subsegmental atelectasis.   There is no evidence of thoracic aortic dissection. There is no evidence of pulmonary artery embolism.   Fatty liver.      He states he has been doing well since discharge. His neck still hurts and he has to chew slowly.He is working on quitting alcohol. States he has quit drinking wine as he could drink up to 4 ottles of wine per day. He currently drinks 1-2 beers. He would like to go into a rehab program but was informed he needs to have $2000 or less in his  account but he currently has $4000 from disability. He went into rehab in the past but relapsed.  He states he needs b/l THA and is in pain in his hips and sits in a wheelchair and sometimes Past Medical History:  Diagnosis Date   Acid reflux    Alcohol abuse    Anxiety    Depression    Hip pain    Hypertension    Polysubstance use disorder 07/01/2021   No Known Allergies  Current Outpatient Medications on File Prior to Visit  Medication Sig Dispense Refill   amLODipine (NORVASC) 10 MG tablet Take 1 tablet (10 mg total) by  mouth daily. 30 tablet 0   cloNIDine (CATAPRES - DOSED IN MG/24 HR) 0.1 mg/24hr patch Place 1 patch (0.1 mg total) onto the skin once a week. 4 patch 12   folic acid (FOLVITE) 1 MG tablet Take 1 tablet (1 mg total) by mouth daily. 30 tablet 0   ibuprofen  (ADVIL,MOTRIN) 200 MG tablet Take 800 mg by mouth every 6 (six) hours as needed for moderate pain.     losartan (COZAAR) 25 MG tablet Take 1 tablet (25 mg total) by mouth daily. 30 tablet 0   Multiple Vitamins-Minerals (CERTAVITE/ANTIOXIDANTS) TABS Take 1 tablet by mouth daily. 30 tablet 0   ondansetron (ZOFRAN) 4 MG tablet Take 1 tablet (4 mg total) by mouth every 8 (eight) hours as needed for nausea or vomiting. 20 tablet 0   pantoprazole (PROTONIX) 40 MG tablet Take 1 tablet (40 mg total) by mouth daily. 30 tablet 0   sucralfate (CARAFATE) 1 GM/10ML suspension Take 10 mLs (1 g total) by mouth 4 (four) times daily -  with meals and at bedtime. 420 mL 0   thiamine 100 MG tablet Take 1 tablet (100 mg total) by mouth daily. 30 tablet 0   [DISCONTINUED] omeprazole (PRILOSEC) 20 MG capsule Take 20 mg by mouth daily.      No current facility-administered medications on file prior to visit.    ROS: See HPI  Observations/Objective: Awake, alert, oriented x3 Not in acute distress Normal mood   CMP Latest Ref Rng & Units 07/07/2021 07/06/2021 07/05/2021  Glucose 70 - 99 mg/dL 161(W117(H) 960(A128(H) 540(J110(H)  BUN 6 - 20 mg/dL 12 11 7   Creatinine 0.61 - 1.24 mg/dL 8.110.89 9.140.84 7.820.86  Sodium 135 - 145 mmol/L 130(L) 133(L) 130(L)  Potassium 3.5 - 5.1 mmol/L 3.6 3.4(L) 3.4(L)  Chloride 98 - 111 mmol/L 102 105 103  CO2 22 - 32 mmol/L 18(L) 20(L) 21(L)  Calcium 8.9 - 10.3 mg/dL 9.5(A8.5(L) 8.3(L) 8.0(L)  Total Protein 6.5 - 8.1 g/dL 6.6 6.5 6.6  Total Bilirubin 0.3 - 1.2 mg/dL 0.4 0.7 0.6  Alkaline Phos 38 - 126 U/L 89 96 86  AST 15 - 41 U/L 249(H) 217(H) 173(H)  ALT 0 - 44 U/L 219(H) 170(H) 118(H)    Lipid Panel  No results found for: CHOL, TRIG, HDL, CHOLHDL, VLDL, LDLCALC, LDLDIRECT, LABVLDL  No results found for: HGBA1C  Assessment and Plan: 1. Alcohol dependence with intoxication with complication (HCC) Strongly encouraged to work on cutting back and eventually quitting alcohol He is working towards  inpatient rehab Continues to consume alcohol but has cut out wine  2. Elevated liver enzymes Discussed that this is most likely due to his ongoing alcohol abuse Discussed that this could get worse and predispose him to cirrhosis and possibly hepatocellular carcinoma I will have him scheduled for an in person office visit to repeat his liver enzymes stay  3. Essential hypertension Stable Will reassess at next in person visit Continue antihypertensives Counseled on blood pressure goal of less than 130/80, low-sodium, DASH diet, medication compliance, 150 minutes of moderate intensity exercise per week. Discussed medication compliance, adverse effects.   Follow Up Instructions: 3 weeks   I discussed the assessment and treatment plan with the patient. The patient was provided an opportunity to ask questions and all were answered. The patient agreed with the plan and demonstrated an understanding of the instructions.   The patient was advised to call back or seek an in-person evaluation if the symptoms worsen or if the  condition fails to improve as anticipated.     I provided 13 minutes total of non-face-to-face time during this encounter.   Hoy Register, MD, FAAFP. [facility] [place], [place] [phone]   07/09/2021, 3:03 PM

## 2021-09-08 ENCOUNTER — Ambulatory Visit: Payer: Medicaid Other | Admitting: Family Medicine

## 2021-09-17 ENCOUNTER — Other Ambulatory Visit: Payer: Self-pay | Admitting: Internal Medicine

## 2021-09-18 LAB — LIPID PANEL
Cholesterol: 148 mg/dL (ref <200)
HDL: 59 mg/dL (ref >=40)
LDL Cholesterol (Calc): 64 mg/dL (calc)
Non-HDL Cholesterol (Calc): 89 mg/dL (calc) (ref <130)
Total CHOL/HDL Ratio: 2.5 (calc) (ref <5.0)
Triglycerides: 167 mg/dL — ABNORMAL HIGH (ref <150)

## 2021-09-18 LAB — COMPLETE METABOLIC PANEL WITH GFR
AG Ratio: 1 (calc) (ref 1.0–2.5)
ALT: 39 U/L (ref 9–46)
AST: 77 U/L — ABNORMAL HIGH (ref 10–35)
Albumin: 3.9 g/dL (ref 3.6–5.1)
Alkaline phosphatase (APISO): 120 U/L (ref 35–144)
BUN: 12 mg/dL (ref 7–25)
CO2: 23 mmol/L (ref 20–32)
Calcium: 8.8 mg/dL (ref 8.6–10.3)
Chloride: 97 mmol/L — ABNORMAL LOW (ref 98–110)
Creat: 1.26 mg/dL (ref 0.70–1.30)
Globulin: 4.1 g/dL (calc) — ABNORMAL HIGH (ref 1.9–3.7)
Glucose, Bld: 93 mg/dL (ref 65–99)
Potassium: 3.1 mmol/L — ABNORMAL LOW (ref 3.5–5.3)
Sodium: 134 mmol/L — ABNORMAL LOW (ref 135–146)
Total Bilirubin: 0.8 mg/dL (ref 0.2–1.2)
Total Protein: 8 g/dL (ref 6.1–8.1)
eGFR: 69 mL/min/{1.73_m2} (ref >=60)

## 2021-09-18 LAB — CBC
HCT: 34.6 % — ABNORMAL LOW (ref 38.5–50.0)
Hemoglobin: 11.5 g/dL — ABNORMAL LOW (ref 13.2–17.1)
MCH: 30.2 pg (ref 27.0–33.0)
MCHC: 33.2 g/dL (ref 32.0–36.0)
MCV: 90.8 fL (ref 80.0–100.0)
MPV: 9.3 fL (ref 7.5–12.5)
Platelets: 299 10*3/uL (ref 140–400)
RBC: 3.81 10*6/uL — ABNORMAL LOW (ref 4.20–5.80)
RDW: 13.1 % (ref 11.0–15.0)
WBC: 5.4 10*3/uL (ref 3.8–10.8)

## 2021-09-18 LAB — PSA: PSA: 3.32 ng/mL (ref <=4.00)

## 2021-09-18 LAB — VITAMIN D 25 HYDROXY (VIT D DEFICIENCY, FRACTURES): Vit D, 25-Hydroxy: 14 ng/mL — ABNORMAL LOW (ref 30–100)

## 2021-09-18 LAB — TSH: TSH: 0.36 mIU/L — ABNORMAL LOW (ref 0.40–4.50)

## 2021-12-09 ENCOUNTER — Telehealth: Payer: Self-pay | Admitting: Pharmacist

## 2022-06-20 IMAGING — CT CT CHEST W/ CM
2 of 3 series · 15 of 36 positions shown, 18 images · IV contrast (agent unspecified)
Comparison: 07/01/2021

CLINICAL DATA: Chest pain, pneumomediastinum seen in the previous
CT

EXAM:
CT CHEST WITH CONTRAST
TECHNIQUE: Multidetector CT imaging of the chest was performed during
intravenous contrast administration.

[Series 3: chest with 2mm st · axial · 0.76mm/px · z∈[+1162,+1448]mm · 12 of 169 slices shown, 15 images]
[im 13/169  mediastinal]
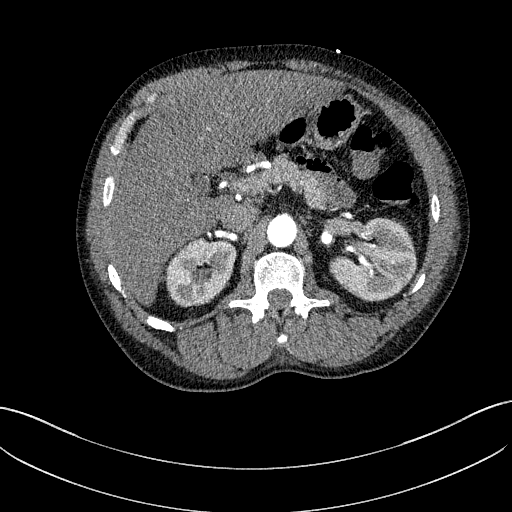
[im 13/169  lung]
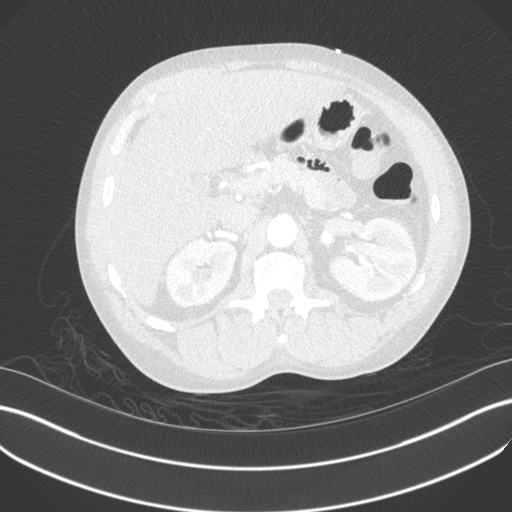
[im 25/169  lung]
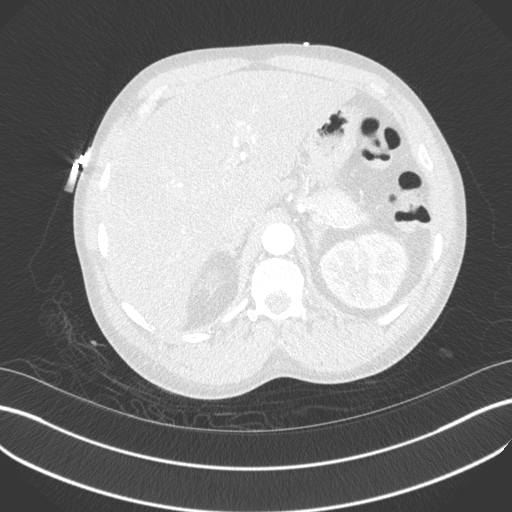
[im 38/169  lung]
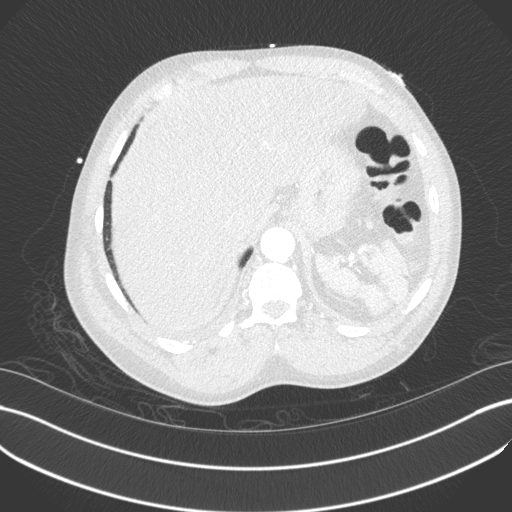
[im 50/169  lung]
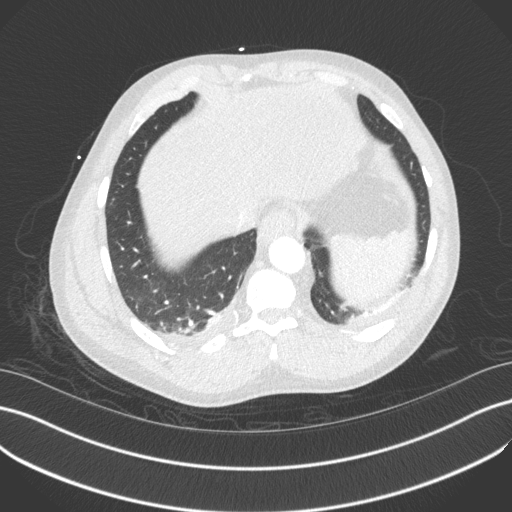
[im 63/169  mediastinal]
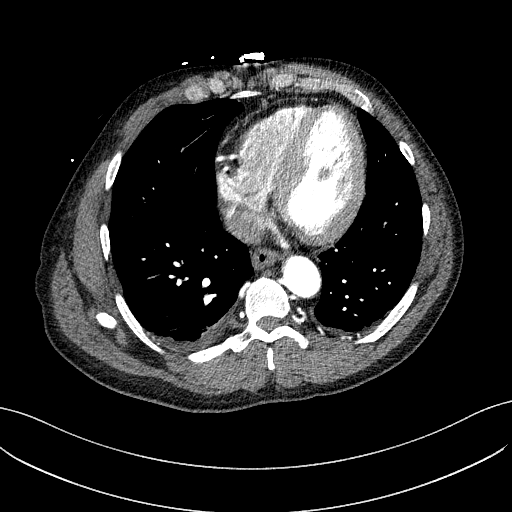
[im 63/169  lung]
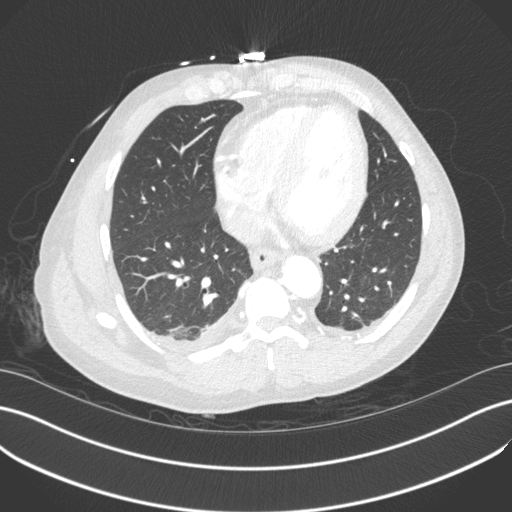
[im 75/169  lung]
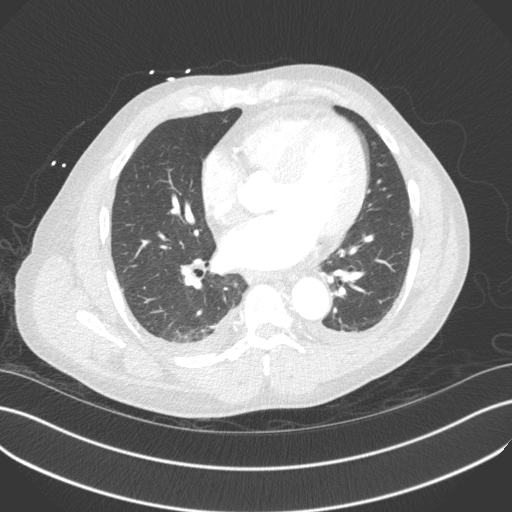
[im 94/169  lung]
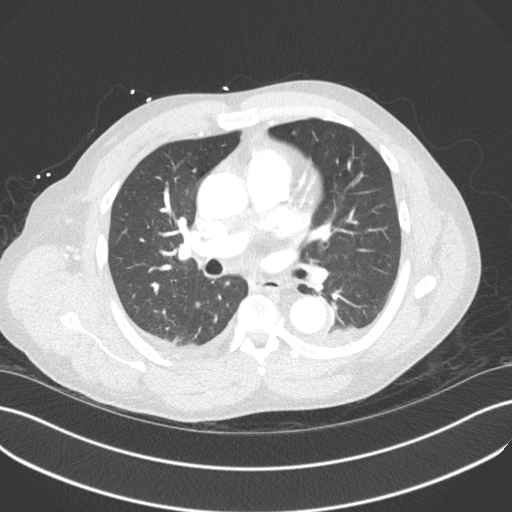
[im 106/169  lung]
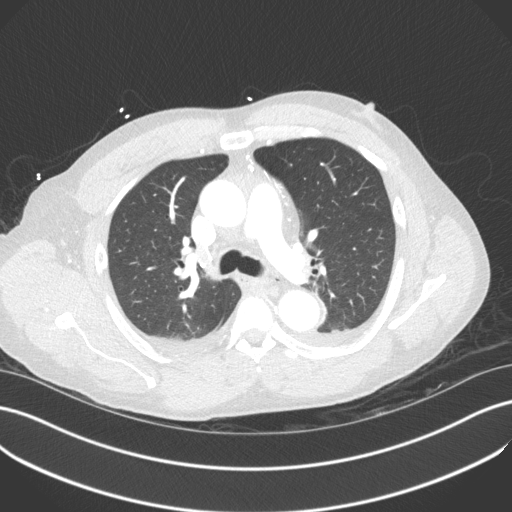
[im 119/169  mediastinal]
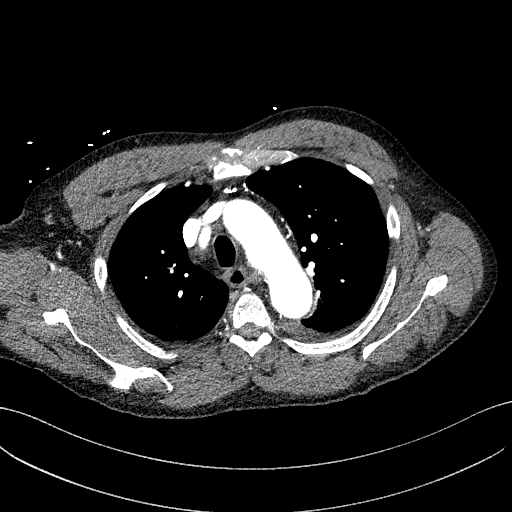
[im 119/169  lung]
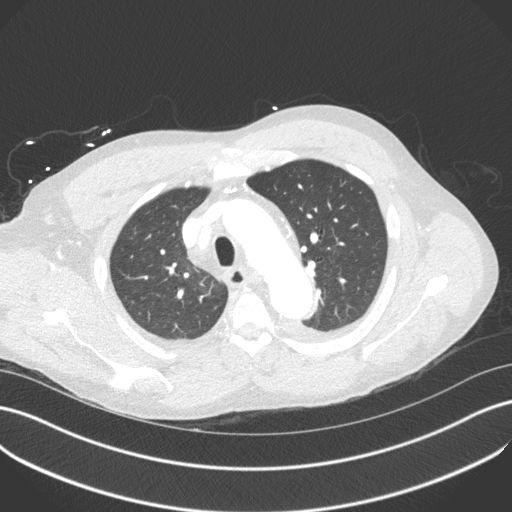
[im 131/169  lung]
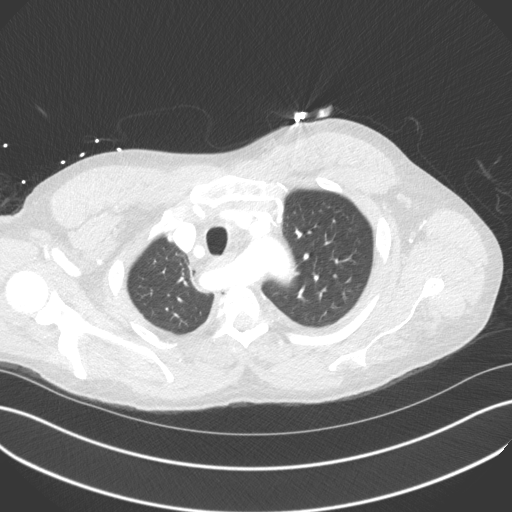
[im 144/169  lung]
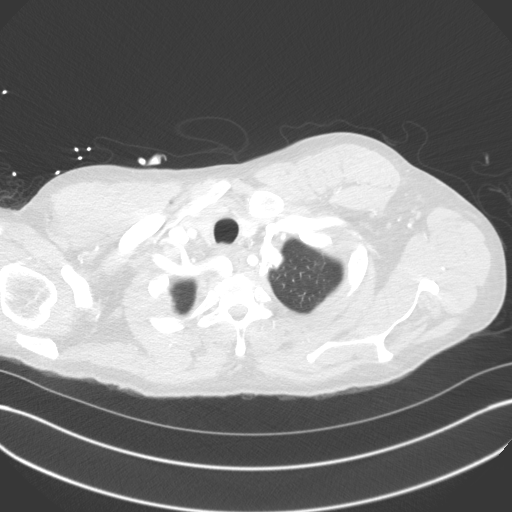
[im 156/169  lung]
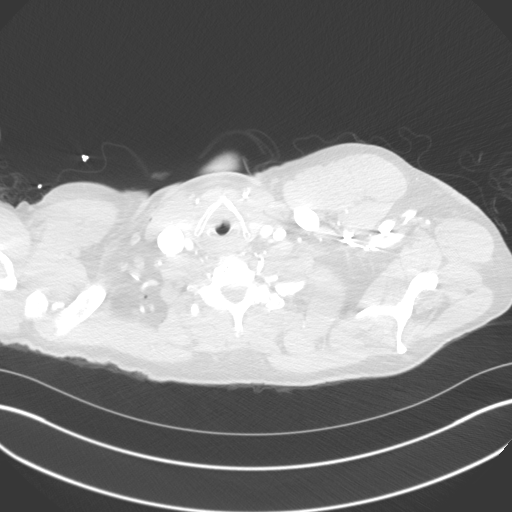

[Series 5: chest with 2mm st cor · coronal · 0.67mm/px · 3 of 151 slices shown]
[im 31/151  lung]
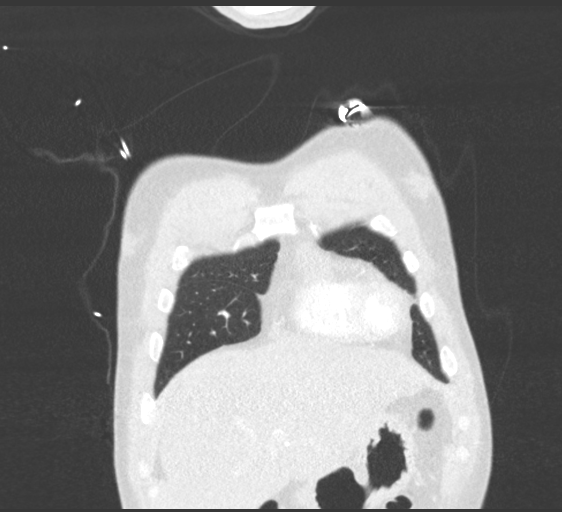
[im 61/151  lung]
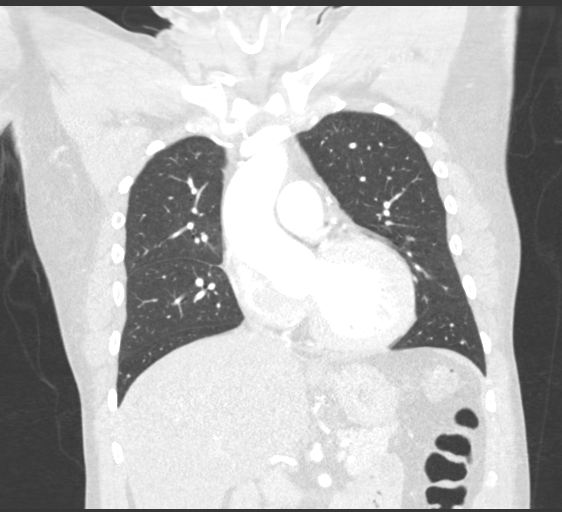
[im 91/151  lung]
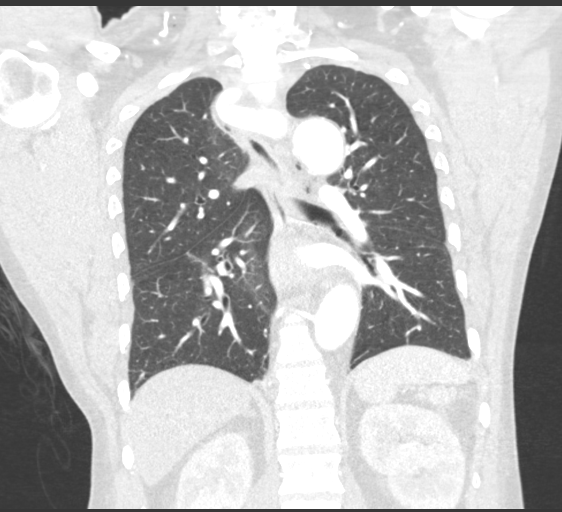

[15 of 36 positions shown; findings below may reference images not displayed]

RADIATION DOSE REDUCTION: This exam was performed according to the
departmental dose-optimization program which includes automated
exposure control, adjustment of the mA and/or kV according to
patient size and/or use of iterative reconstruction technique.

CONTRAST:  75mL OMNIPAQUE IOHEXOL 350 MG/ML SOLN
FINDINGS: Cardiovascular: There is homogeneous enhancement in thoracic aorta.
Right subclavian is arising from the left side of aortic arch and is
traversing the mediastinum posterior to the esophagus. There is
ectasia of ascending thoracic aorta measuring 3.8 cm. There are no
intraluminal filling defects in the pulmonary artery branches.

Mediastinum/Nodes: No new significant lymphadenopathy is seen. There
is almost complete interval clearing pneumomediastinum. There are
few small pockets of air in the superior and middle mediastinum.
There are no pockets of air in the wall of the esophagus. No
significant inflammatory changes are noted in the posterior
mediastinum. Small pockets of air are noted in the soft tissues of
the neck.

Lungs/Pleura: There are small linear densities in the posterior
lower lung fields. Small bilateral pleural effusions are seen. There
is no pneumothorax.

Upper Abdomen: There is fatty infiltration in the liver.

Musculoskeletal: Unremarkable.
IMPRESSION: There is almost complete interval clearing of pneumomediastinum.
There are few scattered residual tiny pockets of air in mediastinum
and right side of the neck. There is no evidence of any significant
air in the esophageal wall. There are no inflammatory changes or
loculated fluid collections in the posterior mediastinum.

Small bilateral pleural effusions. Small linear densities in the
posterior lower lung fields may suggest subsegmental atelectasis.

There is no evidence of thoracic aortic dissection. There is no
evidence of pulmonary artery embolism.

Fatty liver.

## 2022-11-26 ENCOUNTER — Inpatient Hospital Stay (HOSPITAL_COMMUNITY)
Admission: EM | Admit: 2022-11-26 | Discharge: 2022-12-01 | DRG: 917 | Disposition: A | Discharge status: Home / Self Care | Payer: Medicaid Other | Attending: Internal Medicine | Admitting: Internal Medicine

## 2022-11-26 ENCOUNTER — Other Ambulatory Visit: Payer: Self-pay

## 2022-11-26 ENCOUNTER — Emergency Department (HOSPITAL_COMMUNITY): Payer: Medicaid Other

## 2022-11-26 ENCOUNTER — Encounter (HOSPITAL_COMMUNITY): Payer: Self-pay | Admitting: Emergency Medicine

## 2022-11-26 DIAGNOSIS — F10120 Alcohol abuse with intoxication, uncomplicated: Secondary | ICD-10-CM | POA: Diagnosis present

## 2022-11-26 DIAGNOSIS — F1990 Other psychoactive substance use, unspecified, uncomplicated: Secondary | ICD-10-CM | POA: Diagnosis present

## 2022-11-26 DIAGNOSIS — T50901A Poisoning by unspecified drugs, medicaments and biological substances, accidental (unintentional), initial encounter: Secondary | ICD-10-CM | POA: Diagnosis present

## 2022-11-26 DIAGNOSIS — E872 Acidosis, unspecified: Secondary | ICD-10-CM | POA: Diagnosis present

## 2022-11-26 DIAGNOSIS — E860 Dehydration: Secondary | ICD-10-CM | POA: Diagnosis present

## 2022-11-26 DIAGNOSIS — K219 Gastro-esophageal reflux disease without esophagitis: Secondary | ICD-10-CM | POA: Diagnosis present

## 2022-11-26 DIAGNOSIS — E876 Hypokalemia: Secondary | ICD-10-CM | POA: Diagnosis present

## 2022-11-26 DIAGNOSIS — F17210 Nicotine dependence, cigarettes, uncomplicated: Secondary | ICD-10-CM | POA: Diagnosis present

## 2022-11-26 DIAGNOSIS — I2489 Other forms of acute ischemic heart disease: Secondary | ICD-10-CM | POA: Diagnosis present

## 2022-11-26 DIAGNOSIS — Z5900 Homelessness unspecified: Secondary | ICD-10-CM

## 2022-11-26 DIAGNOSIS — R9431 Abnormal electrocardiogram [ECG] [EKG]: Secondary | ICD-10-CM | POA: Diagnosis present

## 2022-11-26 DIAGNOSIS — E222 Syndrome of inappropriate secretion of antidiuretic hormone: Secondary | ICD-10-CM | POA: Diagnosis present

## 2022-11-26 DIAGNOSIS — T40411A Poisoning by fentanyl or fentanyl analogs, accidental (unintentional), initial encounter: Secondary | ICD-10-CM | POA: Diagnosis present

## 2022-11-26 DIAGNOSIS — Z79899 Other long term (current) drug therapy: Secondary | ICD-10-CM

## 2022-11-26 DIAGNOSIS — T405X1A Poisoning by cocaine, accidental (unintentional), initial encounter: Principal | ICD-10-CM | POA: Diagnosis present

## 2022-11-26 DIAGNOSIS — T50904A Poisoning by unspecified drugs, medicaments and biological substances, undetermined, initial encounter: Principal | ICD-10-CM

## 2022-11-26 DIAGNOSIS — Y908 Blood alcohol level of 240 mg/100 ml or more: Secondary | ICD-10-CM | POA: Diagnosis present

## 2022-11-26 DIAGNOSIS — I10 Essential (primary) hypertension: Secondary | ICD-10-CM | POA: Diagnosis present

## 2022-11-26 DIAGNOSIS — N179 Acute kidney failure, unspecified: Secondary | ICD-10-CM | POA: Diagnosis present

## 2022-11-26 DIAGNOSIS — G928 Other toxic encephalopathy: Secondary | ICD-10-CM | POA: Diagnosis present

## 2022-11-26 LAB — CBG MONITORING, ED: Glucose-Capillary: 178 mg/dL — ABNORMAL HIGH (ref 70–99)

## 2022-11-26 NOTE — ED Provider Notes (Signed)
[facility] [facility]GENC[facility] DEPARTMENT AT [facility] Provider Note   CSN: [id number] Arrival date & time: 11/26/22  2310     History  Chief Complaint  Patient presents with   Drug Overdose    Kevin [name] is a 52 y.o. male.  With PMH of alcohol abuse, polysubstance use, HTN, history of pneumomediastinum and esophageal perforation who was brought in by EMS from a gas station for overdose NIH found by fire department and given approximately 1 minute of CPR as well as 2 mg intranasal Narcan.  Patient endorses crack cocaine, weed and alcohol use.  Patient is still intoxicated on arrival and not providing or forthcoming with much history.  When patient more clinically sober now although still slightly intoxicated.  Reporting some stabbing chest pain.  Remembers drinking a lot earlier and reports drinking daily with tremors and history of seizure.  Reports getting drugs from a drug dealer does not remember exactly what was in there. [VB]   Drug Overdose       Home Medications Prior to Admission medications   Medication Sig Start Date End Date Taking? Authorizing Provider  amLODipine (NORVASC) 10 MG tablet Take 1 tablet (10 mg total) by mouth daily. 07/08/21   Cora Collum, DO  cloNIDine (CATAPRES - DOSED IN MG/24 HR) 0.1 mg/24hr patch Place 1 patch (0.1 mg total) onto the skin once a week. 07/09/21   Cora Collum, DO  folic acid (FOLVITE) 1 MG tablet Take 1 tablet (1 mg total) by mouth daily. 07/08/21   Cora Collum, DO  ibuprofen (ADVIL,MOTRIN) 200 MG tablet Take 800 mg by mouth every 6 (six) hours as needed for moderate pain.    [provider]  losartan (COZAAR) 25 MG tablet Take 1 tablet (25 mg total) by mouth daily. 07/08/21   Cora Collum, DO  Multiple Vitamins-Minerals (CERTAVITE/ANTIOXIDANTS) TABS Take 1 tablet by mouth daily. 07/08/21   Cora Collum, DO  ondansetron (ZOFRAN) 4 MG tablet Take 1 tablet (4 mg total) by mouth every 8 (eight)  hours as needed for nausea or vomiting. 07/07/21   Idalia Needle, Lucas Mallow, DO  pantoprazole (PROTONIX) 40 MG tablet Take 1 tablet (40 mg total) by mouth daily. 07/07/21   Cora Collum, DO  sucralfate (CARAFATE) 1 GM/10ML suspension Take 10 mLs (1 g total) by mouth 4 (four) times daily -  with meals and at bedtime. 07/07/21   Cora Collum, DO  thiamine 100 MG tablet Take 1 tablet (100 mg total) by mouth daily. 07/07/21   Cora Collum, DO  omeprazole (PRILOSEC) 20 MG capsule Take 20 mg by mouth daily.   09/06/11  [provider]      Allergies    Patient has no known allergies.    Review of Systems   Review of Systems  Physical Exam Updated Vital Signs BP (!) 129/92   Pulse 73   Temp 97.6 F (36.4 C) (Oral)   Resp 15   Ht 5\' 5"  (1.651 m)   Wt 70.9 kg   SpO2 97%   BMI 26.01 kg/m  Physical Exam Constitutional: Wakes up to stimulation, confused, intoxicated, diaphoretic. Eyes: Pupils pinpoint ENT      Head: Normocephalic and atraumatic.[name]      Neck: No stridor. Cardiovascular: S1, S2, regular rate, palpable equal bilateral DP and radial pulses Respiratory: Normal respiratory effort. Breath sounds are normal.  No crepitus on chest wall or evidence of external traumatic injury.  O2 sat  98 on 2 L nasal cannula. Gastrointestinal: Soft and nontender. Musculoskeletal: No deformity of lower extremities noted Neurologic: Intoxicated, pupils pinpoint minimally reactive, intermittent movement of all 4 extremities Skin: Skin is cool and diaphoretic Psychiatric: Intoxicated  ED Results / Procedures / Treatments   Labs (all labs ordered are listed, but only abnormal results are displayed) Labs Reviewed  COMPREHENSIVE METABOLIC PANEL - Abnormal; Notable for the following components:      Result Value   Sodium 133 (*)    Potassium 2.8 (*)    CO2 18 (*)    Glucose, Bld 189 (*)    Creatinine, Ser 1.36 (*)    Calcium 8.1 (*)    AST 96 (*)    Total Bilirubin 1.4 (*)    Anion  gap 16 (*)    All other components within normal limits  CBC WITH DIFFERENTIAL/PLATELET - Abnormal; Notable for the following components:   RBC 3.66 (*)    Hemoglobin 11.8 (*)    HCT 35.1 (*)    Abs Immature Granulocytes 0.18 (*)    All other components within normal limits  ETHANOL - Abnormal; Notable for the following components:   Alcohol, Ethyl (B) 240 (*)    All other components within normal limits  CBG MONITORING, ED - Abnormal; Notable for the following components:   Glucose-Capillary 178 (*)    All other components within normal limits  TROPONIN I (HIGH SENSITIVITY) - Abnormal; Notable for the following components:   Troponin I (High Sensitivity) 36 (*)    All other components within normal limits  MAGNESIUM  TROPONIN I (HIGH SENSITIVITY)    EKG EKG Interpretation  Date/Time:  Thursday November 26 2022 23:12:28 EDT Ventricular Rate:  73 PR Interval:  168 QRS Duration: 100 QT Interval:  471 QTC Calculation: 520 R Axis:   51 Text Interpretation: Sinus rhythm Minimal ST depression, diffuse leads Prolonged QT interval qt prolonged from prior Confirmed by Vivien Rossetti ([id number]) on 11/26/2022 11:15:18 PM  Radiology CT Head Wo Contrast  Result Date: 11/27/2022 CLINICAL DATA:  ams, polysubstance use, found down EXAM: CT HEAD WITHOUT CONTRAST TECHNIQUE: Contiguous axial images were obtained from the base of the skull through the vertex without intravenous contrast. RADIATION DOSE REDUCTION: This exam was performed according to the departmental dose-optimization program which includes automated exposure control, adjustment of the mA and/or kV according to patient size and/or use of iterative reconstruction technique. COMPARISON:  07/04/2018 FINDINGS: Brain: No acute intracranial abnormality. Specifically, no hemorrhage, hydrocephalus, mass lesion, acute infarction, or significant intracranial injury. Mild cerebral atrophy. Vascular: No hyperdense vessel or unexpected calcification.  Skull: No acute calvarial abnormality. Sinuses/Orbits: No acute findings Other: None IMPRESSION: Mild atrophy.  No acute intracranial abnormality. Electronically Signed   By: Charlett Nose M.D.   On: 11/27/2022 00:22   DG Chest Portable 1 View  Result Date: 11/26/2022 CLINICAL DATA:  Recent drug overdose EXAM: PORTABLE CHEST 1 VIEW COMPARISON:  07/04/2021 CT FINDINGS: Cardiac shadow is prominent but accentuated by the frontal technique. Lungs are clear. No bony abnormality is noted. IMPRESSION: No acute abnormality seen. Electronically Signed   By: Alcide Clever M.D.   On: 11/26/2022 23:31    Procedures .Critical Care  Performed by: Mardene Sayer, MD Authorized by: Mardene Sayer, MD   Critical care provider statement:    Critical care time (minutes):  40   Critical care was necessary to treat or prevent imminent or life-threatening deterioration of the following conditions:  Toxidrome and metabolic crisis  Critical care was time spent personally by me on the following activities:  Development of treatment plan with patient or surrogate, evaluation of patient's response to treatment, examination of patient, ordering and review of laboratory studies, ordering and review of radiographic studies, ordering and performing treatments and interventions, pulse oximetry, re-evaluation of patient's condition, review of old charts and obtaining history from patient or surrogate   Care discussed with: admitting provider       Medications Ordered in ED Medications  potassium chloride 10 mEq in 100 mL IVPB (10 mEq Intravenous New Bag/Given 11/27/22 0306)  magnesium sulfate IVPB 2 g 50 mL (2 g Intravenous New Bag/Given 11/27/22 0327)  LORazepam (ATIVAN) tablet 1-4 mg (has no administration in time range)    Or  LORazepam (ATIVAN) injection 1-4 mg (has no administration in time range)  folic acid (FOLVITE) tablet 1 mg (has no administration in time range)  multivitamin with minerals tablet 1  tablet (has no administration in time range)  thiamine (VITAMIN B1) injection 100 mg (100 mg Intravenous Given 11/27/22 0035)  lactated ringers bolus 1,000 mL (1,000 mLs Intravenous New Bag/Given 11/27/22 0310)  potassium chloride SA (KLOR-CON M) CR tablet 40 mEq (40 mEq Oral Given 11/27/22 0313)    ED Course/ Medical Decision Making/ A&P Clinical Course as of 11/27/22 0413  Fri Nov 27, 2022  0329 Patient more clinically sober now although still slightly intoxicated.  Reporting some stabbing chest pain.  Remembers drinking a lot earlier and reports drinking daily with tremors and history of seizure.  Reports getting drugs from a drug dealer does not remember exactly what was in there. [VB]  [id number] D/w Dr Joneen Roach, hospitalist, for admission. [VB]    Clinical Course User Index [VB] Mardene Sayer, MD                             Medical Decision Making DEVRY SEGAR is a 52 y.o. male.  With PMH of alcohol abuse, polysubstance use, HTN, history of pneumomediastinum and esophageal perforation who was brought in by EMS from a gas station for overdose NIH found by fire department and given approximately 1 minute of CPR as well as 2 mg intranasal Narcan.  Patient endorses crack cocaine, weed and alcohol use.  Patient is still intoxicated on arrival and not providing or forthcoming with much history.  CBG 178 on arrival.  EKG reviewed by me with minimal ST depression in diffuse leads with slightly prolonged Qtc.  Minimal ST depression similar to prior.  CT head obtained which I personally reviewed no evidence of ICH.  Chest x-ray obtained no evidence of pneumothorax or pneumonia.   Labs notable for hypokalemia 2.8 associated with prolonged QTc as well as elevated creatinine 1.36 from baseline of 0.8 with anion gap acidosis likely secondary to dehydration/substance use.  Elevated AST 96 likely due to chronic alcohol use as well as total bilirubin 1.4 however no abdominal pain on exam, not  concern for biliary pathology.  Initial high sensitive troponin 15 repeat high sensitive troponin 36 likely due to demand ischemia from hypertension and drug use.  Due to patient's prolonged QTc and hypokalemia as well as reported stabbing atypical chest pain and elevated troponins likely related to drug use tonight, will request admission for continued observation and telemetry with CIWA protocol and repletion of electrolyte abnormalities. D/w Dr Joneen Roach, hospitalist, for admission.    Amount and/or Complexity of Data Reviewed Labs: ordered. Radiology: ordered.  Risk Prescription drug management.     Final Clinical Impression(s) / ED Diagnoses Final diagnoses:  Overdose of undetermined intent, initial encounter  Hypokalemia  AKI (acute kidney injury) [facility])    Rx / DC Orders ED Discharge Orders     None         Mardene Sayer, MD 11/27/22 0413

## 2022-11-26 NOTE — ED Triage Notes (Signed)
Pt to ED via [facility] picked up from gas station for OD tonight.  Per EMS fire department on scene started CPR approx 1 minute which was done by time EMS arrived.  Fire gave 2mg  intranasal narcan which patient responded to, pt placed on 2L  by EMS.  Pt diaphoretic, c/o feet tingling.  Pt reports crack cocaine, weed and ETOH.

## 2022-11-27 ENCOUNTER — Emergency Department (HOSPITAL_COMMUNITY): Payer: Medicaid Other

## 2022-11-27 DIAGNOSIS — E872 Acidosis, unspecified: Secondary | ICD-10-CM | POA: Diagnosis present

## 2022-11-27 DIAGNOSIS — F10120 Alcohol abuse with intoxication, uncomplicated: Secondary | ICD-10-CM | POA: Diagnosis present

## 2022-11-27 DIAGNOSIS — Z5900 Homelessness unspecified: Secondary | ICD-10-CM | POA: Diagnosis not present

## 2022-11-27 DIAGNOSIS — T50901A Poisoning by unspecified drugs, medicaments and biological substances, accidental (unintentional), initial encounter: Secondary | ICD-10-CM | POA: Diagnosis not present

## 2022-11-27 DIAGNOSIS — R9431 Abnormal electrocardiogram [ECG] [EKG]: Secondary | ICD-10-CM | POA: Diagnosis present

## 2022-11-27 DIAGNOSIS — T405X1A Poisoning by cocaine, accidental (unintentional), initial encounter: Secondary | ICD-10-CM | POA: Diagnosis not present

## 2022-11-27 DIAGNOSIS — R079 Chest pain, unspecified: Secondary | ICD-10-CM | POA: Diagnosis not present

## 2022-11-27 DIAGNOSIS — E222 Syndrome of inappropriate secretion of antidiuretic hormone: Secondary | ICD-10-CM | POA: Diagnosis present

## 2022-11-27 DIAGNOSIS — G928 Other toxic encephalopathy: Secondary | ICD-10-CM | POA: Diagnosis present

## 2022-11-27 DIAGNOSIS — I10 Essential (primary) hypertension: Secondary | ICD-10-CM | POA: Diagnosis present

## 2022-11-27 DIAGNOSIS — T40411A Poisoning by fentanyl or fentanyl analogs, accidental (unintentional), initial encounter: Secondary | ICD-10-CM | POA: Diagnosis present

## 2022-11-27 DIAGNOSIS — K219 Gastro-esophageal reflux disease without esophagitis: Secondary | ICD-10-CM | POA: Diagnosis present

## 2022-11-27 DIAGNOSIS — N179 Acute kidney failure, unspecified: Secondary | ICD-10-CM | POA: Diagnosis present

## 2022-11-27 DIAGNOSIS — I2489 Other forms of acute ischemic heart disease: Secondary | ICD-10-CM | POA: Diagnosis present

## 2022-11-27 DIAGNOSIS — Z79899 Other long term (current) drug therapy: Secondary | ICD-10-CM | POA: Diagnosis not present

## 2022-11-27 DIAGNOSIS — E876 Hypokalemia: Secondary | ICD-10-CM | POA: Diagnosis present

## 2022-11-27 DIAGNOSIS — F17210 Nicotine dependence, cigarettes, uncomplicated: Secondary | ICD-10-CM | POA: Diagnosis present

## 2022-11-27 DIAGNOSIS — E860 Dehydration: Secondary | ICD-10-CM | POA: Diagnosis present

## 2022-11-27 DIAGNOSIS — Y908 Blood alcohol level of 240 mg/100 ml or more: Secondary | ICD-10-CM | POA: Diagnosis present

## 2022-11-27 LAB — CBC WITH DIFFERENTIAL/PLATELET
Abs Immature Granulocytes: 0.18 10*3/uL — ABNORMAL HIGH (ref 0.00–0.07)
Abs Immature Granulocytes: 0.04 10*3/uL (ref 0.00–0.07)
Basophils Absolute: 0.1 10*3/uL (ref 0.0–0.1)
Basophils Absolute: 0 10*3/uL (ref 0.0–0.1)
Basophils Relative: 1 %
Basophils Relative: 1 %
Eosinophils Absolute: 0.1 10*3/uL (ref 0.0–0.5)
Eosinophils Absolute: 0.1 10*3/uL (ref 0.0–0.5)
Eosinophils Relative: 1 %
Eosinophils Relative: 1 %
HCT: 35.1 % — ABNORMAL LOW (ref 39.0–52.0)
HCT: 34.2 % — ABNORMAL LOW (ref 39.0–52.0)
Hemoglobin: 11.8 g/dL — ABNORMAL LOW (ref 13.0–17.0)
Hemoglobin: 11.7 g/dL — ABNORMAL LOW (ref 13.0–17.0)
Immature Granulocytes: 3 %
Immature Granulocytes: 1 %
Lymphocytes Relative: 23 %
Lymphocytes Relative: 13 %
Lymphs Abs: 1.3 10*3/uL (ref 0.7–4.0)
Lymphs Abs: 1 10*3/uL (ref 0.7–4.0)
MCH: 32.2 pg (ref 26.0–34.0)
MCH: 32.7 pg (ref 26.0–34.0)
MCHC: 33.6 g/dL (ref 30.0–36.0)
MCHC: 34.2 g/dL (ref 30.0–36.0)
MCV: 95.9 fL (ref 80.0–100.0)
MCV: 95.5 fL (ref 80.0–100.0)
Monocytes Absolute: 0.7 10*3/uL (ref 0.1–1.0)
Monocytes Absolute: 0.8 10*3/uL (ref 0.1–1.0)
Monocytes Relative: 13 %
Monocytes Relative: 9 %
Neutro Abs: 3.4 10*3/uL (ref 1.7–7.7)
Neutro Abs: 6.2 10*3/uL (ref 1.7–7.7)
Neutrophils Relative %: 59 %
Neutrophils Relative %: 75 %
Platelets: 299 10*3/uL (ref 150–400)
Platelets: 281 10*3/uL (ref 150–400)
RBC: 3.66 MIL/uL — ABNORMAL LOW (ref 4.22–5.81)
RBC: 3.58 MIL/uL — ABNORMAL LOW (ref 4.22–5.81)
RDW: 15.1 % (ref 11.5–15.5)
RDW: 15 % (ref 11.5–15.5)
WBC: 5.8 10*3/uL (ref 4.0–10.5)
WBC: 8.1 10*3/uL (ref 4.0–10.5)
nRBC: 0 % (ref 0.0–0.2)
nRBC: 0 % (ref 0.0–0.2)

## 2022-11-27 LAB — COMPREHENSIVE METABOLIC PANEL
ALT: 30 U/L (ref 0–44)
ALT: 30 U/L (ref 0–44)
AST: 96 U/L — ABNORMAL HIGH (ref 15–41)
AST: 70 U/L — ABNORMAL HIGH (ref 15–41)
Albumin: 3.6 g/dL (ref 3.5–5.0)
Albumin: 3.2 g/dL — ABNORMAL LOW (ref 3.5–5.0)
Alkaline Phosphatase: 119 U/L (ref 38–126)
Alkaline Phosphatase: 119 U/L (ref 38–126)
Anion gap: 16 — ABNORMAL HIGH (ref 5–15)
Anion gap: 11 (ref 5–15)
BUN: 10 mg/dL (ref 6–20)
BUN: 7 mg/dL (ref 6–20)
CO2: 18 mmol/L — ABNORMAL LOW (ref 22–32)
CO2: 22 mmol/L (ref 22–32)
Calcium: 8.1 mg/dL — ABNORMAL LOW (ref 8.9–10.3)
Calcium: 8.1 mg/dL — ABNORMAL LOW (ref 8.9–10.3)
Chloride: 99 mmol/L (ref 98–111)
Chloride: 99 mmol/L (ref 98–111)
Creatinine, Ser: 1.36 mg/dL — ABNORMAL HIGH (ref 0.61–1.24)
Creatinine, Ser: 0.82 mg/dL (ref 0.61–1.24)
GFR, Estimated: >60 mL/min (ref >60)
GFR, Estimated: >60 mL/min (ref >60)
Glucose, Bld: 189 mg/dL — ABNORMAL HIGH (ref 70–99)
Glucose, Bld: 83 mg/dL (ref 70–99)
Potassium: 2.8 mmol/L — ABNORMAL LOW (ref 3.5–5.1)
Potassium: 3.5 mmol/L (ref 3.5–5.1)
Sodium: 133 mmol/L — ABNORMAL LOW (ref 135–145)
Sodium: 132 mmol/L — ABNORMAL LOW (ref 135–145)
Total Bilirubin: 1.4 mg/dL — ABNORMAL HIGH (ref 0.3–1.2)
Total Bilirubin: 0.8 mg/dL (ref 0.3–1.2)
Total Protein: 8.1 g/dL (ref 6.5–8.1)
Total Protein: 7.6 g/dL (ref 6.5–8.1)

## 2022-11-27 LAB — TROPONIN I (HIGH SENSITIVITY)
Troponin I (High Sensitivity): 15 ng/L (ref <18)
Troponin I (High Sensitivity): 36 ng/L — ABNORMAL HIGH (ref <18)
Troponin I (High Sensitivity): 60 ng/L — ABNORMAL HIGH (ref <18)
Troponin I (High Sensitivity): 46 ng/L — ABNORMAL HIGH (ref <18)
Troponin I (High Sensitivity): 33 ng/L — ABNORMAL HIGH (ref <18)
Troponin I (High Sensitivity): 28 ng/L — ABNORMAL HIGH (ref <18)

## 2022-11-27 LAB — CBG MONITORING, ED
Glucose-Capillary: 79 mg/dL (ref 70–99)
Glucose-Capillary: 81 mg/dL (ref 70–99)

## 2022-11-27 LAB — RAPID URINE DRUG SCREEN, HOSP PERFORMED
Amphetamines: NOT DETECTED
Barbiturates: NOT DETECTED
Benzodiazepines: NOT DETECTED
Cocaine: POSITIVE — AB
Opiates: NOT DETECTED
Tetrahydrocannabinol: POSITIVE — AB

## 2022-11-27 LAB — ETHANOL: Alcohol, Ethyl (B): 240 mg/dL — ABNORMAL HIGH (ref <10)

## 2022-11-27 LAB — HEMOGLOBIN A1C
Hgb A1c MFr Bld: 4.6 % — ABNORMAL LOW (ref 4.8–5.6)
Mean Plasma Glucose: 85.32 mg/dL

## 2022-11-27 LAB — GLUCOSE, CAPILLARY
Glucose-Capillary: 107 mg/dL — ABNORMAL HIGH (ref 70–99)
Glucose-Capillary: 111 mg/dL — ABNORMAL HIGH (ref 70–99)

## 2022-11-27 LAB — LACTIC ACID, PLASMA
Lactic Acid, Venous: 2.1 mmol/L (ref 0.5–1.9)
Lactic Acid, Venous: 2.3 mmol/L (ref 0.5–1.9)

## 2022-11-27 LAB — MAGNESIUM: Magnesium: 1.4 mg/dL — ABNORMAL LOW (ref 1.7–2.4)

## 2022-11-27 LAB — HIV ANTIBODY (ROUTINE TESTING W REFLEX): HIV Screen 4th Generation wRfx: NONREACTIVE

## 2022-11-27 MED ORDER — CLONIDINE HCL 0.1 MG/24HR TD PTWK
0.1000 mg | MEDICATED_PATCH | TRANSDERMAL | Status: DC
  Filled 2022-11-27: qty 1

## 2022-11-27 MED ORDER — ENOXAPARIN SODIUM 40 MG/0.4ML IJ SOSY
40.0000 mg | PREFILLED_SYRINGE | INTRAMUSCULAR | Status: DC
  Administered 2022-11-27 – 2022-12-01 (×5): 40 mg via SUBCUTANEOUS
  Filled 2022-11-27 (×5): qty 0.4

## 2022-11-27 MED ORDER — LACTATED RINGERS IV SOLN: INTRAVENOUS | Status: DC

## 2022-11-27 MED ORDER — ACETAMINOPHEN 325 MG PO TABS
650.0000 mg | ORAL_TABLET | Freq: Four times a day (QID) | ORAL | Status: DC | PRN
  Administered 2022-11-27 – 2022-12-01 (×4): 650 mg via ORAL
  Filled 2022-11-27 (×5): qty 2

## 2022-11-27 MED ORDER — ALBUTEROL SULFATE (2.5 MG/3ML) 0.083% IN NEBU: 2.5000 mg | INHALATION_SOLUTION | RESPIRATORY_TRACT | Status: DC | PRN

## 2022-11-27 MED ORDER — LACTATED RINGERS IV BOLUS
1000.0000 mL | Freq: Once | INTRAVENOUS | Status: AC
  Administered 2022-11-27: 1000 mL via INTRAVENOUS

## 2022-11-27 MED ORDER — LOSARTAN POTASSIUM 50 MG PO TABS
25.0000 mg | ORAL_TABLET | Freq: Every day | ORAL | Status: DC
  Administered 2022-11-27 – 2022-12-01 (×5): 25 mg via ORAL
  Filled 2022-11-27 (×5): qty 1

## 2022-11-27 MED ORDER — POTASSIUM CHLORIDE CRYS ER 20 MEQ PO TBCR
40.0000 meq | EXTENDED_RELEASE_TABLET | Freq: Once | ORAL | Status: AC
  Administered 2022-11-27: 40 meq via ORAL
  Filled 2022-11-27: qty 2

## 2022-11-27 MED ORDER — MAGNESIUM SULFATE 2 GM/50ML IV SOLN
2.0000 g | Freq: Once | INTRAVENOUS | Status: AC
  Administered 2022-11-27: 2 g via INTRAVENOUS
  Filled 2022-11-27: qty 50

## 2022-11-27 MED ORDER — LORAZEPAM 2 MG/ML IJ SOLN: 1.0000 mg | INTRAMUSCULAR | Status: DC | PRN

## 2022-11-27 MED ORDER — POTASSIUM CHLORIDE 10 MEQ/100ML IV SOLN
10.0000 meq | INTRAVENOUS | Status: AC
  Administered 2022-11-27 (×2): 10 meq via INTRAVENOUS
  Filled 2022-11-27 (×2): qty 100

## 2022-11-27 MED ORDER — ADULT MULTIVITAMIN W/MINERALS CH
1.0000 | ORAL_TABLET | Freq: Every day | ORAL | Status: DC
  Administered 2022-11-27 – 2022-12-01 (×5): 1 via ORAL
  Filled 2022-11-27 (×5): qty 1

## 2022-11-27 MED ORDER — POTASSIUM CHLORIDE IN NACL 20-0.9 MEQ/L-% IV SOLN
INTRAVENOUS | Status: DC
  Filled 2022-11-27 (×2): qty 1000

## 2022-11-27 MED ORDER — BISACODYL 5 MG PO TBEC: 5.0000 mg | DELAYED_RELEASE_TABLET | Freq: Every day | ORAL | Status: DC | PRN

## 2022-11-27 MED ORDER — FOLIC ACID 1 MG PO TABS
1.0000 mg | ORAL_TABLET | Freq: Every day | ORAL | Status: DC
  Administered 2022-11-27 – 2022-12-01 (×5): 1 mg via ORAL
  Filled 2022-11-27 (×5): qty 1

## 2022-11-27 MED ORDER — DOCUSATE SODIUM 100 MG PO CAPS
100.0000 mg | ORAL_CAPSULE | Freq: Two times a day (BID) | ORAL | Status: DC
  Administered 2022-11-27 – 2022-12-01 (×7): 100 mg via ORAL
  Filled 2022-11-27 (×8): qty 1

## 2022-11-27 MED ORDER — POLYETHYLENE GLYCOL 3350 17 G PO PACK: 17.0000 g | PACK | Freq: Every day | ORAL | Status: DC | PRN

## 2022-11-27 MED ORDER — LORAZEPAM 1 MG PO TABS
1.0000 mg | ORAL_TABLET | ORAL | Status: DC | PRN
  Administered 2022-11-27: 1 mg via ORAL
  Filled 2022-11-27: qty 1

## 2022-11-27 MED ORDER — ORAL CARE MOUTH RINSE: 15.0000 mL | OROMUCOSAL | Status: DC | PRN

## 2022-11-27 MED ORDER — SODIUM CHLORIDE 0.9% FLUSH
3.0000 mL | Freq: Two times a day (BID) | INTRAVENOUS | Status: DC
  Administered 2022-11-27 – 2022-12-01 (×8): 3 mL via INTRAVENOUS

## 2022-11-27 MED ORDER — INSULIN ASPART 100 UNIT/ML IJ SOLN
0.0000 [IU] | Freq: Every day | INTRAMUSCULAR | Status: DC
  Administered 2022-11-29: 2 [IU] via SUBCUTANEOUS
  Administered 2022-11-30: 0 [IU] via SUBCUTANEOUS

## 2022-11-27 MED ORDER — INSULIN ASPART 100 UNIT/ML IJ SOLN
0.0000 [IU] | Freq: Three times a day (TID) | INTRAMUSCULAR | Status: DC
  Administered 2022-11-28 – 2022-11-29 (×3): 1 [IU] via SUBCUTANEOUS

## 2022-11-27 MED ORDER — HYDRALAZINE HCL 20 MG/ML IJ SOLN
5.0000 mg | INTRAMUSCULAR | Status: DC | PRN
  Administered 2022-11-27 (×2): 5 mg via INTRAVENOUS
  Filled 2022-11-27 (×2): qty 1

## 2022-11-27 MED ORDER — SUCRALFATE 1 GM/10ML PO SUSP
1.0000 g | Freq: Three times a day (TID) | ORAL | Status: DC
  Administered 2022-11-27 – 2022-12-01 (×11): 1 g via ORAL
  Filled 2022-11-27 (×15): qty 10

## 2022-11-27 MED ORDER — THIAMINE HCL 100 MG/ML IJ SOLN
100.0000 mg | Freq: Once | INTRAMUSCULAR | Status: AC
  Administered 2022-11-27: 100 mg via INTRAVENOUS
  Filled 2022-11-27: qty 2

## 2022-11-27 MED ORDER — AMLODIPINE BESYLATE 10 MG PO TABS
10.0000 mg | ORAL_TABLET | Freq: Every day | ORAL | Status: DC
  Administered 2022-11-27 – 2022-12-01 (×5): 10 mg via ORAL
  Filled 2022-11-27: qty 2
  Filled 2022-11-27 (×4): qty 1

## 2022-11-27 NOTE — ED Notes (Signed)
Patient transported to CT 

## 2022-11-27 NOTE — ED Notes (Signed)
ED TO INPATIENT HANDOFF REPORT  ED Nurse Name and Phone #:  Theophilus Bones [phone]  S Name/Age/Gender Kevin [name] 52 y.o. male Room/Bed: 037C/037C  Code Status   Code Status: Prior  Home/SNF/Other Home Patient oriented to: self, place, time, and situation Is this baseline? Yes   Triage Complete: Triage complete  Chief Complaint Accidental overdose, initial encounter [T50.901A]  Triage Note Pt to ED via [facility]EM[facility] picked up from gas station for OD tonight.  Per EMS fire department on scene started CPR approx 1 minute which was done by time EMS arrived.  Fire gave 2mg  intranasal narcan which patient responded to, pt placed on 2L [place] by EMS.  Pt diaphoretic, c/o feet tingling.  Pt reports crack cocaine, weed and ETOH.    Allergies No Known Allergies  Level of Care/Admitting Diagnosis ED Disposition     ED Disposition  Admit   Condition  --   Comment  Hospital Area: [facility] [[id number]]  Level of Care: Telemetry Medical [104]  May place patient in observation at [facility] or [facility] if equivalent level of care is available:: No  Covid Evaluation: Asymptomatic - no recent exposure (last 10 days) testing not required  Diagnosis: Accidental overdose, initial encounter [086578]  Admitting Physician: Jonah Blue [[id number]]  Attending Physician: Jonah Blue [[id number]]          B Medical/Surgery History Past Medical History:  Diagnosis Date   Acid reflux    Alcohol abuse    Anxiety    Depression    Hip pain    Hypertension    Polysubstance use disorder 07/01/2021   Past Surgical History:  Procedure Laterality Date   alcohol abuse     FACIAL RECONSTRUCTION SURGERY     d/t facial fx's   left leg surgery       A IV Location/Drains/Wounds Patient Lines/Drains/Airways Status     Active Line/Drains/Airways     Name Placement date Placement time Site Days   Peripheral IV 11/26/22 20 G Left Antecubital 11/26/22  2319  Antecubital  1             Intake/Output Last 24 hours  Intake/Output Summary (Last 24 hours) at 11/27/2022 0747 Last data filed at 11/27/2022 4696 Gross per 24 hour  Intake 1252.16 ml  Output --  Net 1252.16 ml    Labs/Imaging Results for orders placed or performed during the hospital encounter of 11/26/22 (from the past 48 hour(s))  Comprehensive metabolic panel     Status: Abnormal   Collection Time: 11/26/22 11:15 PM  Result Value Ref Range   Sodium 133 (L) 135 - 145 mmol/L   Potassium 2.8 (L) 3.5 - 5.1 mmol/L    Comment: HEMOLYSIS AT THIS LEVEL MAY AFFECT RESULT   Chloride 99 98 - 111 mmol/L   CO2 18 (L) 22 - 32 mmol/L   Glucose, Bld 189 (H) 70 - 99 mg/dL    Comment: Glucose reference range applies only to samples taken after fasting for at least 8 hours.   BUN 10 6 - 20 mg/dL   Creatinine, Ser 2.95 (H) 0.61 - 1.24 mg/dL   Calcium 8.1 (L) 8.9 - 10.3 mg/dL   Total Protein 8.1 6.5 - 8.1 g/dL   Albumin 3.6 3.5 - 5.0 g/dL   AST 96 (H) 15 - 41 U/L    Comment: HEMOLYSIS AT THIS LEVEL MAY AFFECT RESULT   ALT 30 0 - 44 U/L    Comment: HEMOLYSIS AT THIS LEVEL MAY  AFFECT RESULT   Alkaline Phosphatase 119 38 - 126 U/L   Total Bilirubin 1.4 (H) 0.3 - 1.2 mg/dL    Comment: HEMOLYSIS AT THIS LEVEL MAY AFFECT RESULT   GFR, Estimated >60 >60 mL/min    Comment: (NOTE) Calculated using the CKD-EPI Creatinine Equation (2021)    Anion gap 16 (H) 5 - 15    Comment: Performed at [facility], [address], [place], [place] [address]  CBC with Differential     Status: Abnormal   Collection Time: 11/26/22 11:15 PM  Result Value Ref Range   WBC 5.8 4.0 - 10.5 K/uL   RBC 3.66 (L) 4.22 - 5.81 MIL/uL   Hemoglobin 11.8 (L) 13.0 - 17.0 g/dL   HCT 21.3 (L) 08.6 - 57.8 %   MCV 95.9 80.0 - 100.0 fL   MCH 32.2 26.0 - 34.0 pg   MCHC 33.6 30.0 - 36.0 g/dL   RDW 46.9 62.9 - 52.8 %   Platelets 299 150 - 400 K/uL   nRBC 0.0 0.0 - 0.2 %   Neutrophils Relative % 59 %   Neutro Abs 3.4 1.7 - 7.7 K/uL    Lymphocytes Relative 23 %   Lymphs Abs 1.3 0.7 - 4.0 K/uL   Monocytes Relative 13 %   Monocytes Absolute 0.7 0.1 - 1.0 K/uL   Eosinophils Relative 1 %   Eosinophils Absolute 0.1 0.0 - 0.5 K/uL   Basophils Relative 1 %   Basophils Absolute 0.1 0.0 - 0.1 K/uL   Immature Granulocytes 3 %   Abs Immature Granulocytes 0.18 (H) 0.00 - 0.07 K/uL    Comment: Performed at [facility], [address] [place], [place] [address]  Troponin I (High Sensitivity)     Status: None   Collection Time: 11/26/22 11:15 PM  Result Value Ref Range   Troponin I (High Sensitivity) 15 <18 ng/L    Comment: (NOTE) Elevated high sensitivity troponin I (hsTnI) values and significant  changes across serial measurements may suggest ACS but many other  chronic and acute conditions are known to elevate hsTnI results.  Refer to the "Links" section for chest pain algorithms and additional  guidance. Performed at [facility], [address], [place], [place] [address]   Ethanol     Status: Abnormal   Collection Time: 11/26/22 11:15 PM  Result Value Ref Range   Alcohol, Ethyl (B) 240 (H) <10 mg/dL    Comment: (NOTE) Lowest detectable limit for serum alcohol is 10 mg/dL.  For medical purposes only. Performed at [facility], [address] [place], [place] [address]   POC CBG, ED     Status: Abnormal   Collection Time: 11/26/22 11:21 PM  Result Value Ref Range   Glucose-Capillary 178 (H) 70 - 99 mg/dL    Comment: Glucose reference range applies only to samples taken after fasting for at least 8 hours.  Troponin I (High Sensitivity)     Status: Abnormal   Collection Time: 11/27/22  1:40 AM  Result Value Ref Range   Troponin I (High Sensitivity) 36 (H) <18 ng/L    Comment: RESULT CALLED TO, READ BACK BY AND VERIFIED WITH W. HICKS, RN.  [phone] 11/27/22. LPAIT (NOTE) Elevated high sensitivity troponin I (hsTnI) values and significant  changes across serial measurements may suggest ACS but many  other  chronic and acute conditions are known to elevate hsTnI results.  Refer to the "Links" section for chest pain algorithms and  additional  guidance. Performed at [facility], [address], [place], [place] [address]    CT Head Wo Contrast  Result Date: 11/27/2022 CLINICAL DATA:  ams, polysubstance use, found down EXAM: CT HEAD WITHOUT CONTRAST TECHNIQUE: Contiguous axial images were obtained from the base of the skull through the vertex without intravenous contrast. RADIATION DOSE REDUCTION: This exam was performed according to the departmental dose-optimization program which includes automated exposure control, adjustment of the mA and/or kV according to patient size and/or use of iterative reconstruction technique. COMPARISON:  07/04/2018 FINDINGS: Brain: No acute intracranial abnormality. Specifically, no hemorrhage, hydrocephalus, mass lesion, acute infarction, or significant intracranial injury. Mild cerebral atrophy. Vascular: No hyperdense vessel or unexpected calcification. Skull: No acute calvarial abnormality. Sinuses/Orbits: No acute findings Other: None IMPRESSION: Mild atrophy.  No acute intracranial abnormality. Electronically Signed   By: Charlett Nose M.D.   On: 11/27/2022 00:22   DG Chest Portable 1 View  Result Date: 11/26/2022 CLINICAL DATA:  Recent drug overdose EXAM: PORTABLE CHEST 1 VIEW COMPARISON:  07/04/2021 CT FINDINGS: Cardiac shadow is prominent but accentuated by the frontal technique. Lungs are clear. No bony abnormality is noted. IMPRESSION: No acute abnormality seen. Electronically Signed   By: Alcide Clever M.D.   On: 11/26/2022 23:31    Pending Labs Unresulted Labs (From admission, onward)     Start     Ordered   11/27/22 0743  Comprehensive metabolic panel  Once,   R        11/27/22 0743   11/27/22 0743  Lactic acid, plasma  STAT Now then every 3 hours,   R (with STAT occurrences)      11/27/22 0743   11/27/22 0742  CBC with Differential/Platelet   Once,   R        11/27/22 0743   11/27/22 0520  Hemoglobin A1c  Once,   URGENT       Comments: To assess prior glycemic control    11/27/22 0519   11/27/22 0159  Magnesium  Add-on,   AD        11/27/22 0158            Vitals/Pain Today's Vitals   11/27/22 0430 11/27/22 0530 11/27/22 0545 11/27/22 0600  BP: (!) 146/96 (!) 159/102 (!) 150/102 (!) 169/99  Pulse: 73 (!) 51 72 82  Resp: 13 14 15 13   Temp:    97.9 F (36.6 C)  TempSrc:    Oral  SpO2: 98% 99% 98% 97%  Weight:      Height:      PainSc:        Isolation Precautions No active isolations  Medications Medications  LORazepam (ATIVAN) tablet 1-4 mg (has no administration in time range)    Or  LORazepam (ATIVAN) injection 1-4 mg (has no administration in time range)  folic acid (FOLVITE) tablet 1 mg (has no administration in time range)  multivitamin with minerals tablet 1 tablet (has no administration in time range)  0.9 % NaCl with KCl 20 mEq/ L  infusion ( Intravenous New Bag/Given 11/27/22 0643)  insulin aspart (novoLOG) injection 0-9 Units (has no administration in time range)  insulin aspart (novoLOG) injection 0-5 Units (has no administration in time range)  acetaminophen (TYLENOL) tablet 650 mg (has no administration in time range)  thiamine (VITAMIN B1) injection 100 mg (100 mg Intravenous Given 11/27/22 0035)  lactated ringers bolus 1,000 mL (0 mLs Intravenous Stopped 11/27/22 0643)  potassium chloride 10 mEq in 100  mL IVPB (0 mEq Intravenous Stopped 11/27/22 0551)  potassium chloride SA (KLOR-CON M) CR tablet 40 mEq (40 mEq Oral Given 11/27/22 0313)  magnesium sulfate IVPB 2 g 50 mL (0 g Intravenous Stopped 11/27/22 0448)  potassium chloride SA (KLOR-CON M) CR tablet 40 mEq (40 mEq Oral Given 11/27/22 0643)    Mobility walks     Focused Assessments Cardiac Assessment Handoff:  Cardiac Rhythm: Sinus bradycardia No results found for: "CKTOTAL", "CKMB", "CKMBINDEX", "TROPONINI" No results found for:  "DDIMER" Does the Patient currently have chest pain? No    R Recommendations: See Admitting Provider Note  Report given to:   Additional Notes:

## 2022-11-27 NOTE — H&P (Signed)
History and Physical    Patient: Kevin [name] WUJ:[id number] DOB: 1970-10-08 DOA: 11/26/2022 DOS: the patient was seen and examined on 11/27/2022 PCP: Hoy Register, MD  Patient coming from: Homeless; NOK: Cathlean Sauer   Chief Complaint: Overdose  HPI: KIJANA [name] is a 52 y.o. male with medical history significant of ETOH dependence/polysubstance abuse and HTN presenting with overdose.  He was found down at a gas station and responded to Narcan in the field.   He reports that he used crack cocaine and "must have passed out."  He acknowledges using this way too often, almost daily.  Also drinking way too much ETOH.  Denies use of heroin, thinks maybe the cocaine was laced with other drugs.  He has never been to rehab but may consider it, although he prefers to stop on his own if possible.  He is currently homeless.  He has L lower chest/rib pain.    ER Course:  Carryover, per Dr. Joneen Roach:  History of alcohol and polysubstance abuse.  Patient found down.  CPR done briefly.  Patient given Narcan.  Patient waking.  He complains of atypical chest pain.  He has elevated troponin and hyperkalemia.      Review of Systems: As mentioned in the history of present illness. All other systems reviewed and are negative. Past Medical History:  Diagnosis Date   Acid reflux    Alcohol abuse    Anxiety    Depression    Hip pain    Hypertension    Polysubstance use disorder 07/01/2021   Past Surgical History:  Procedure Laterality Date   alcohol abuse     FACIAL RECONSTRUCTION SURGERY     d/t facial fx's   left leg surgery     Social History:  reports that he has been smoking cigarettes. He has been smoking an average of .25 packs per day. He has never used smokeless tobacco. He reports current alcohol use of about 2.0 - 3.0 standard drinks of alcohol per week. He reports current drug use. Drugs: Marijuana and Cocaine.  No Known Allergies  History reviewed. No pertinent family  history.  Prior to Admission medications   Medication Sig Start Date End Date Taking? Authorizing Provider  amLODipine (NORVASC) 10 MG tablet Take 1 tablet (10 mg total) by mouth daily. 07/08/21   Cora Collum, DO  cloNIDine (CATAPRES - DOSED IN MG/24 HR) 0.1 mg/24hr patch Place 1 patch (0.1 mg total) onto the skin once a week. 07/09/21   Cora Collum, DO  folic acid (FOLVITE) 1 MG tablet Take 1 tablet (1 mg total) by mouth daily. 07/08/21   Cora Collum, DO  ibuprofen (ADVIL,MOTRIN) 200 MG tablet Take 800 mg by mouth every 6 (six) hours as needed for moderate pain.    [provider]  losartan (COZAAR) 25 MG tablet Take 1 tablet (25 mg total) by mouth daily. 07/08/21   Cora Collum, DO  Multiple Vitamins-Minerals (CERTAVITE/ANTIOXIDANTS) TABS Take 1 tablet by mouth daily. 07/08/21   Cora Collum, DO  ondansetron (ZOFRAN) 4 MG tablet Take 1 tablet (4 mg total) by mouth every 8 (eight) hours as needed for nausea or vomiting. 07/07/21   Idalia Needle, Lucas Mallow, DO  pantoprazole (PROTONIX) 40 MG tablet Take 1 tablet (40 mg total) by mouth daily. 07/07/21   Cora Collum, DO  sucralfate (CARAFATE) 1 GM/10ML suspension Take 10 mLs (1 g total) by mouth 4 (four) times daily -  with meals and at  bedtime. 07/07/21   Cora Collum, DO  thiamine 100 MG tablet Take 1 tablet (100 mg total) by mouth daily. 07/07/21   Cora Collum, DO  omeprazole (PRILOSEC) 20 MG capsule Take 20 mg by mouth daily.   09/06/11  [provider]    Physical Exam: Vitals:   11/27/22 1030 11/27/22 1043 11/27/22 1100 11/27/22 1130  BP: (!) 182/111 (!) 182/111 (!) 155/112 (!) 155/95  Pulse: (!) 54  85 88  Resp:   17 13  Temp:      TempSrc:      SpO2: 99%  96% 95%  Weight:      Height:       General:  Appears calm and comfortable and is in NAD, disheveled Eyes:  EOMI, normal lids, iris ENT:  grossly normal hearing, lips & tongue, mmm Neck:  no LAD, masses or  thyromegaly Cardiovascular:  RRR, no m/r/g. No LE edema.  Respiratory:   CTA bilaterally with no wheezes/rales/rhonchi.  Normal respiratory effort. Abdomen:  soft, NT, ND Skin:  no rash or induration seen on limited exam Musculoskeletal:  grossly normal tone BUE/BLE, good ROM, no bony abnormality Psychiatric:  blunted mood and affect, speech fluent and appropriate, AOx3 Neurologic:  CN 2-12 grossly intact, moves all extremities in coordinated fashion   Radiological Exams on Admission: Independently reviewed - see discussion in A/P where applicable  CT Head Wo Contrast  Result Date: 11/27/2022 CLINICAL DATA:  ams, polysubstance use, found down EXAM: CT HEAD WITHOUT CONTRAST TECHNIQUE: Contiguous axial images were obtained from the base of the skull through the vertex without intravenous contrast. RADIATION DOSE REDUCTION: This exam was performed according to the departmental dose-optimization program which includes automated exposure control, adjustment of the mA and/or kV according to patient size and/or use of iterative reconstruction technique. COMPARISON:  07/04/2018 FINDINGS: Brain: No acute intracranial abnormality. Specifically, no hemorrhage, hydrocephalus, mass lesion, acute infarction, or significant intracranial injury. Mild cerebral atrophy. Vascular: No hyperdense vessel or unexpected calcification. Skull: No acute calvarial abnormality. Sinuses/Orbits: No acute findings Other: None IMPRESSION: Mild atrophy.  No acute intracranial abnormality. Electronically Signed   By: Charlett Nose M.D.   On: 11/27/2022 00:22   DG Chest Portable 1 View  Result Date: 11/26/2022 CLINICAL DATA:  Recent drug overdose EXAM: PORTABLE CHEST 1 VIEW COMPARISON:  07/04/2021 CT FINDINGS: Cardiac shadow is prominent but accentuated by the frontal technique. Lungs are clear. No bony abnormality is noted. IMPRESSION: No acute abnormality seen. Electronically Signed   By: Alcide Clever M.D.   On: 11/26/2022 23:31     EKG: Independently reviewed.  NSR with rate 73; prolonged QTc 520; no evidence of acute ischemia   Labs on Admission: I have personally reviewed the available labs and imaging studies at the time of the admission.  Pertinent labs:    Unremarkable CMP - albumin 3.2, AST 70 (down from 96) HS troponin 15, 36, 60 Lactate 2.1 WBC 8.1 Hgb 11.7 A1c 4.6 ETOH 240    Assessment and Plan: Principal Problem:   Accidental overdose, initial encounter Active Problems:   Polysubstance use disorder   Hypertension   Prolonged Q-T interval on ECG    Overdose -Patient with polysubstance abuse and homelessness presenting with apparent overdose, responded to Narcan -He reportedly received some CPR, elevated troponin and chest wall pain are likely related but will continue to trend troponin -Elevated ETOH level -UDS pending -Will observe overnight on telemetry   Polysubstance abuse -Cessation encouraged; this should be encouraged  on an ongoing basis -UDS ordered  -TOC team consult, would benefit from rehab if patient is willing  Prolonged QTc -Likely associated with overdose -Will attempt to avoid QT-prolonging medications such as PPI, nausea meds, SSRIs -Repeat EKG in AM   HTN -Continue amlodipine, Catapres, losartan     Advance Care Planning:   Code Status: Full Code - Code status was discussed with the patient and/or family at the time of admission.  The patient would want to receive full resuscitative measures at this time.   Consults: TOC team, nutrition  DVT Prophylaxis: Lovenox  Family Communication: None present; he was not aware of the telephone number for his friend at the time of admission  Severity of Illness: The appropriate patient status for this patient is OBSERVATION. Observation status is judged to be reasonable and necessary in order to provide the required intensity of service to ensure the patient's safety. The patient's presenting symptoms, physical exam  findings, and initial radiographic and laboratory data in the context of their medical condition is felt to place them at decreased risk for further clinical deterioration. Furthermore, it is anticipated that the patient will be medically stable for discharge from the hospital within 2 midnights of admission.   Author: Jonah Blue, MD 11/27/2022 11:47 AM  For on call review [email or web address].

## 2022-11-28 ENCOUNTER — Inpatient Hospital Stay (HOSPITAL_COMMUNITY): Payer: Medicaid Other

## 2022-11-28 DIAGNOSIS — T50901A Poisoning by unspecified drugs, medicaments and biological substances, accidental (unintentional), initial encounter: Secondary | ICD-10-CM | POA: Diagnosis not present

## 2022-11-28 DIAGNOSIS — R079 Chest pain, unspecified: Secondary | ICD-10-CM

## 2022-11-28 LAB — URINALYSIS, ROUTINE W REFLEX MICROSCOPIC
Bilirubin Urine: NEGATIVE
Glucose, UA: NEGATIVE mg/dL
Hgb urine dipstick: NEGATIVE
Ketones, ur: NEGATIVE mg/dL
Leukocytes,Ua: NEGATIVE
Nitrite: NEGATIVE
Protein, ur: NEGATIVE mg/dL
Specific Gravity, Urine: 1.019 (ref 1.005–1.030)
pH: 7 (ref 5.0–8.0)

## 2022-11-28 LAB — MAGNESIUM: Magnesium: 1.1 mg/dL — ABNORMAL LOW (ref 1.7–2.4)

## 2022-11-28 LAB — BASIC METABOLIC PANEL
Anion gap: 10 (ref 5–15)
BUN: 12 mg/dL (ref 6–20)
CO2: 24 mmol/L (ref 22–32)
Calcium: 8.5 mg/dL — ABNORMAL LOW (ref 8.9–10.3)
Chloride: 95 mmol/L — ABNORMAL LOW (ref 98–111)
Creatinine, Ser: 1.11 mg/dL (ref 0.61–1.24)
GFR, Estimated: >60 mL/min (ref >60)
Glucose, Bld: 105 mg/dL — ABNORMAL HIGH (ref 70–99)
Potassium: 3.2 mmol/L — ABNORMAL LOW (ref 3.5–5.1)
Sodium: 129 mmol/L — ABNORMAL LOW (ref 135–145)

## 2022-11-28 LAB — ECHOCARDIOGRAM COMPLETE
AR max vel: 2.65 cm2
AV Area VTI: 2.63 cm2
AV Area mean vel: 2.5 cm2
AV Mean grad: 7 mmHg
AV Peak grad: 13.1 mmHg
Ao pk vel: 1.81 m/s
Area-P 1/2: 4.8 cm2
Height: 65 in
S' Lateral: 2.4 cm
Weight: 2500.9 oz

## 2022-11-28 LAB — CBC
HCT: 32.5 % — ABNORMAL LOW (ref 39.0–52.0)
Hemoglobin: 11.1 g/dL — ABNORMAL LOW (ref 13.0–17.0)
MCH: 32.2 pg (ref 26.0–34.0)
MCHC: 34.2 g/dL (ref 30.0–36.0)
MCV: 94.2 fL (ref 80.0–100.0)
Platelets: 276 10*3/uL (ref 150–400)
RBC: 3.45 MIL/uL — ABNORMAL LOW (ref 4.22–5.81)
RDW: 15 % (ref 11.5–15.5)
WBC: 7.2 10*3/uL (ref 4.0–10.5)
nRBC: 0 % (ref 0.0–0.2)

## 2022-11-28 LAB — SODIUM, URINE, RANDOM: Sodium, Ur: 141 mmol/L

## 2022-11-28 LAB — GLUCOSE, CAPILLARY
Glucose-Capillary: 96 mg/dL (ref 70–99)
Glucose-Capillary: 124 mg/dL — ABNORMAL HIGH (ref 70–99)
Glucose-Capillary: 127 mg/dL — ABNORMAL HIGH (ref 70–99)
Glucose-Capillary: 153 mg/dL — ABNORMAL HIGH (ref 70–99)

## 2022-11-28 LAB — OSMOLALITY: Osmolality: 280 mOsm/kg (ref 275–295)

## 2022-11-28 LAB — CREATININE, URINE, RANDOM: Creatinine, Urine: 226 mg/dL

## 2022-11-28 LAB — BRAIN NATRIURETIC PEPTIDE: B Natriuretic Peptide: 155.1 pg/mL — ABNORMAL HIGH (ref 0.0–100.0)

## 2022-11-28 LAB — URIC ACID: Uric Acid, Serum: 5.7 mg/dL (ref 3.7–8.6)

## 2022-11-28 LAB — OSMOLALITY, URINE: Osmolality, Ur: 630 mOsm/kg (ref 300–900)

## 2022-11-28 MED ORDER — FUROSEMIDE 10 MG/ML IJ SOLN
20.0000 mg | Freq: Once | INTRAMUSCULAR | Status: AC
  Administered 2022-11-28: 20 mg via INTRAVENOUS
  Filled 2022-11-28: qty 2

## 2022-11-28 MED ORDER — HYDRALAZINE HCL 50 MG PO TABS
50.0000 mg | ORAL_TABLET | Freq: Three times a day (TID) | ORAL | Status: DC
  Administered 2022-11-28 – 2022-12-01 (×10): 50 mg via ORAL
  Filled 2022-11-28 (×11): qty 1

## 2022-11-28 MED ORDER — MAGNESIUM SULFATE 4 GM/100ML IV SOLN
4.0000 g | Freq: Once | INTRAVENOUS | Status: AC
  Administered 2022-11-28: 4 g via INTRAVENOUS
  Filled 2022-11-28: qty 100

## 2022-11-28 MED ORDER — ASPIRIN 81 MG PO CHEW
81.0000 mg | CHEWABLE_TABLET | Freq: Every day | ORAL | Status: DC
  Administered 2022-11-28 – 2022-12-01 (×4): 81 mg via ORAL
  Filled 2022-11-28 (×4): qty 1

## 2022-11-28 MED ORDER — CARVEDILOL 3.125 MG PO TABS: 3.1250 mg | ORAL_TABLET | Freq: Two times a day (BID) | ORAL | Status: DC

## 2022-11-28 MED ORDER — ISOSORBIDE MONONITRATE ER 30 MG PO TB24
30.0000 mg | ORAL_TABLET | Freq: Every day | ORAL | Status: DC
  Administered 2022-11-28 – 2022-12-01 (×4): 30 mg via ORAL
  Filled 2022-11-28 (×4): qty 1

## 2022-11-28 MED ORDER — POTASSIUM CHLORIDE CRYS ER 20 MEQ PO TBCR
40.0000 meq | EXTENDED_RELEASE_TABLET | Freq: Two times a day (BID) | ORAL | Status: AC
  Administered 2022-11-28 (×2): 40 meq via ORAL
  Filled 2022-11-28 (×2): qty 2

## 2022-11-28 MED ORDER — THIAMINE MONONITRATE 100 MG PO TABS
100.0000 mg | ORAL_TABLET | Freq: Every day | ORAL | Status: DC
  Administered 2022-11-28 – 2022-12-01 (×4): 100 mg via ORAL
  Filled 2022-11-28 (×4): qty 1

## 2022-11-28 MED ORDER — CLONIDINE HCL 0.2 MG PO TABS: 0.2000 mg | ORAL_TABLET | Freq: Three times a day (TID) | ORAL | Status: DC | PRN

## 2022-11-28 NOTE — Progress Notes (Signed)
PROGRESS NOTE                                                                                                                                                                                                             Patient Demographics:    Kevin [name], is a 52 y.o. male, DOB - 1970-11-19, ZOX:[id number]  Outpatient Primary MD for the patient is Hoy Register, MD    LOS - 1  Admit date - 11/26/2022    Chief Complaint  Patient presents with   Drug Overdose       Brief Narrative (HPI from H&P)   52 y.o. male with medical history significant of ETOH dependence/polysubstance abuse and HTN presenting with overdose.  He was found down at a gas station and responded to Narcan in the field.   He reports that he used crack cocaine and "must have passed out."  He acknowledges using this way too often, almost daily.  Also drinking way too much ETOH.  Denies use of heroin, thinks maybe the cocaine was laced with other drugs.  He has never been to rehab but may consider it, although he prefers to stop on his own if possible.  He is currently homeless.  He has L lower chest/rib pain    Subjective:    [name] today has, No headache, No chest pain, No abdominal pain - No Nausea, No new weakness tingling or numbness, no cough - SOB.     Assessment  & Plan :   Recreational polysubstance abuse with cocaine and fentanyl causing metabolic encephalopathy and syncope. He was found unresponsive in the field required Narcan and CPR briefly, unclear whether he had lost pulse, appears stable now, minimal elevation of troponin which is expected with CPR, troponin trend is in non-ACS pattern, EKG stable, minimal pleuritic chest pain of the rib cage post CPR.  Strictly counseled to abstain from recreational drug abuse, check echocardiogram, monitor on telemetry.  Monitor for any withdrawal.  HTN - Meds adjusted for better  control.  Hypokalemia and hypomagnesemia.  Replaced.   Prolonged QTc upon admission.  Resolved.       Condition - Fair  Family Communication  :  None present  Code Status :  Full  Consults  :  None  PUD Prophylaxis :    Procedures  :  CT head.  Nonacute.  Echocardiogram      Disposition Plan  :    Status is: Inpatient  DVT Prophylaxis  :    enoxaparin (LOVENOX) injection 40 mg Start: 11/27/22 1000    Lab Results  Component Value Date   PLT 276 11/28/2022    Diet :  Diet Order             Diet heart healthy/carb modified Room service appropriate? Yes; Fluid consistency: Thin  Diet effective now                    Inpatient Medications  Scheduled Meds:  amLODipine  10 mg Oral Daily   aspirin  81 mg Oral Daily   docusate sodium  100 mg Oral BID   enoxaparin (LOVENOX) injection  40 mg Subcutaneous Q24H   folic acid  1 mg Oral Daily   furosemide  20 mg Intravenous Once   hydrALAZINE  50 mg Oral Q8H   insulin aspart  0-5 Units Subcutaneous QHS   insulin aspart  0-9 Units Subcutaneous TID WC   isosorbide mononitrate  30 mg Oral Daily   losartan  25 mg Oral Daily   multivitamin with minerals  1 tablet Oral Daily   potassium chloride  40 mEq Oral BID   sodium chloride flush  3 mL Intravenous Q12H   sucralfate  1 g Oral TID WC & HS   Continuous Infusions:  magnesium sulfate bolus IVPB     PRN Meds:.acetaminophen, albuterol, bisacodyl, cloNIDine, hydrALAZINE, LORazepam **OR** LORazepam, mouth rinse, polyethylene glycol  Antibiotics  :    Anti-infectives (From admission, onward)    None         Objective:   Vitals:   11/27/22 1731 11/27/22 2041 11/28/22 0000 11/28/22 0400  BP: (!) 153/109 (!) 162/102 (!) 159/98 (!) 155/101  Pulse: 96 (!) 102 94 91  Resp: 19 18 16 13   Temp:   98.1 F (36.7 C) 98.3 F (36.8 C)  TempSrc:   Oral Oral  SpO2: 97% 98% 96% 95%  Weight:      Height:        Wt Readings from Last 3 Encounters:   11/26/22 70.9 kg  07/01/21 69.8 kg  07/04/18 74.8 kg     Intake/Output Summary (Last 24 hours) at 11/28/2022 0827 Last data filed at 11/28/2022 0500 Gross per 24 hour  Intake 1475.98 ml  Output 500 ml  Net 975.98 ml     Physical Exam  Awake Alert, No new F.N deficits, Normal affect [place].AT,PERRAL Supple Neck, No JVD,   Symmetrical Chest wall movement, Good air movement bilaterally, CTAB RRR,No Gallops,Rubs or new Murmurs,  +ve B.Sounds, Abd Soft, No tenderness,   No Cyanosis, Clubbing or edema       Data Review:    Recent Labs  Lab 11/26/22 2315 11/27/22 0900 11/28/22 0218  WBC 5.8 8.1 7.2  HGB 11.8* 11.7* 11.1*  HCT 35.1* 34.2* 32.5*  PLT 299 281 276  MCV 95.9 95.5 94.2  MCH 32.2 32.7 32.2  MCHC 33.6 34.2 34.2  RDW 15.1 15.0 15.0  LYMPHSABS 1.3 1.0  --   MONOABS 0.7 0.8  --   EOSABS 0.1 0.1  --   BASOSABS 0.1 0.0  --     Recent Labs  Lab 11/26/22 2315 11/27/22 0553 11/27/22 0900 11/27/22 1055 11/28/22 0218 11/28/22 0618  NA 133*  --  132*  --  129*  --   K 2.8*  --  3.5  --  3.2*  --   CL 99  --  99  --  95*  --   CO2 18*  --  22  --  24  --   ANIONGAP 16*  --  11  --  10  --   GLUCOSE 189*  --  83  --  105*  --   BUN 10  --  7  --  12  --   CREATININE 1.36*  --  0.82  --  1.11  --   AST 96*  --  70*  --   --   --   ALT 30  --  30  --   --   --   ALKPHOS 119  --  119  --   --   --   BILITOT 1.4*  --  0.8  --   --   --   ALBUMIN 3.6  --  3.2*  --   --   --   LATICACIDVEN  --   --  2.1* 2.3*  --   --   HGBA1C  --  4.6*  --   --   --   --   BNP  --   --   --   --   --  155.1*  MG  --  1.4*  --   --   --  1.1*  CALCIUM 8.1*  --  8.1*  --  8.5*  --       Recent Labs  Lab 11/26/22 2315 11/27/22 0553 11/27/22 0900 11/27/22 1055 11/28/22 0218 11/28/22 0618  LATICACIDVEN  --   --  2.1* 2.3*  --   --   HGBA1C  --  4.6*  --   --   --   --   BNP  --   --   --   --   --  155.1*  MG  --  1.4*  --   --   --  1.1*  CALCIUM 8.1*  --  8.1*  --   8.5*  --       Lab Results  Component Value Date   HGBA1C 4.6 (L) 11/27/2022      Radiology Reports CT Head Wo Contrast  Result Date: 11/27/2022 CLINICAL DATA:  ams, polysubstance use, found down EXAM: CT HEAD WITHOUT CONTRAST TECHNIQUE: Contiguous axial images were obtained from the base of the skull through the vertex without intravenous contrast. RADIATION DOSE REDUCTION: This exam was performed according to the departmental dose-optimization program which includes automated exposure control, adjustment of the mA and/or kV according to patient size and/or use of iterative reconstruction technique. COMPARISON:  07/04/2018 FINDINGS: Brain: No acute intracranial abnormality. Specifically, no hemorrhage, hydrocephalus, mass lesion, acute infarction, or significant intracranial injury. Mild cerebral atrophy. Vascular: No hyperdense vessel or unexpected calcification. Skull: No acute calvarial abnormality. Sinuses/Orbits: No acute findings Other: None IMPRESSION: Mild atrophy.  No acute intracranial abnormality. Electronically Signed   By: Charlett Nose M.D.   On: 11/27/2022 00:22   DG Chest Portable 1 View  Result Date: 11/26/2022 CLINICAL DATA:  Recent drug overdose EXAM: PORTABLE CHEST 1 VIEW COMPARISON:  07/04/2021 CT FINDINGS: Cardiac shadow is prominent but accentuated by the frontal technique. Lungs are clear. No bony abnormality is noted. IMPRESSION: No acute abnormality seen. Electronically Signed   By: Alcide Clever M.D.   On: 11/26/2022 23:31      Signature  -   Susa Raring M.D on 11/28/2022 at 8:27  AM   -  To page go to [email or web address]

## 2022-11-28 NOTE — Progress Notes (Signed)
Initial Nutrition Assessment  DOCUMENTATION CODES:   Not applicable  INTERVENTION:  Liberalize diet to heart healthy, no hx of DM and CBGs low-normal Continue MVI and folic acid fro hx of EtOH/drug abuse. Add 100mg  of thiamine daily  NUTRITION DIAGNOSIS:   Inadequate oral intake related to  (homelessness) as evidenced by per patient/family report.  GOAL:   Patient will meet greater than or equal to 90% of their needs  MONITOR:   PO intake, Labs, Weight trends, I & O's  REASON FOR ASSESSMENT:   Consult  (nutritional goals)  ASSESSMENT:   Pt with hx of alcohol abuse, drug abuse, anxiety/depression, GERD, and HTN presented to ED after an unintentional drug overdose. Received narcan in the field with improvement in symptoms.  Attempted to call pt on room phone, no answer at this time.   Noted that pt reports homelessness and may have a degree of food insecurity. No meal intake recorded and no weight loss noted in hx. However, limited recent wt hx available.  Messaged with RN to inquire about meal intake and dentition to ensure that pt does not need softer consistency diet. Noted that pt currently on heart healthy/carb modified diet. No hx of DM and HgbA1c was low. Will remove carbohydrate restriction.   Some electrolytes low this AM, being replaced by MD. Added thiamine daily for hx of EtOH and drug abuse. Will monitor meal intake to determine if further nutrition interventions are needed or if pt can meet his needs through diet alone.   Nutritionally Relevant Medications: Scheduled Meds:  docusate sodium  100 mg Oral BID   folic acid  1 mg Oral Daily   furosemide  20 mg Intravenous Once   insulin aspart  0-5 Units Subcutaneous QHS   insulin aspart  0-9 Units Subcutaneous TID WC   multivitamin with minerals  1 tablet Oral Daily   potassium chloride  40 mEq Oral BID   sucralfate  1 g Oral TID WC & HS   Continuous Infusions:  magnesium sulfate bolus IVPB     PRN Meds:  bisacodyl, polyethylene glycol  Labs Reviewed: Na 129, chloride 95 K 3.2 Mg 1.1 CBG ranges from 79-111 mg/dL over the last 24 hours HgbA1c 4.6% (low)  Tox screen: Positive for cocaine and THC Alcohol 240 on admission  NUTRITION - FOCUSED PHYSICAL EXAM: Defer to in-person assessment  Diet Order:   Diet Order             Diet Heart Room service appropriate? No; Fluid consistency: Thin  Diet effective now                   EDUCATION NEEDS:   No education needs have been identified at this time  Skin:  Skin Assessment: Reviewed RN Assessment  Last BM:  6/14  Height:   Ht Readings from Last 1 Encounters:  11/26/22 5\' 5"  (1.651 m)    Weight:   Wt Readings from Last 1 Encounters:  11/26/22 70.9 kg    Ideal Body Weight:  61.8 kg  BMI:  Body mass index is 26.01 kg/m.  Estimated Nutritional Needs:  Kcal:  1700-1900 kcal/d Protein:  85-100 g/d Fluid:  1.8-2L/d    Greig Castilla, RD, LDN Clinical Dietitian RD pager # available in [employer]  After hours/weekend pager # available in [facility]

## 2022-11-28 NOTE — Evaluation (Signed)
Physical Therapy Evaluation Patient Details Name: Kevin [name] MRN: [id number] DOB: [birth year] Today's Date: 11/28/2022  History of Present Illness  52 y/o M admitted to [facility] on 6/13 after an overdose, responding to Narcan in the field and receiving CPR briefly. CT of head showing no acute intracranial abnormality. PMHx: ETOH dependence, polysubstance abuse, HTN  Clinical Impression  Pt presents today functioning at or close to his mobility baseline. Pt reports utilizing a rollator at baseline but is now unsure of where his rollator is located. Pt utilized rollator for ambulation, supervision provided for line management and safety, but pt reports feeling at his baseline, no LOB noted with mobility, no changes in sensation, weakness in B hip flexion but pt reports needing B hip replacements and weakness is at baseline. Pt mobilizing back at his baseline, no further benefit from skilled acute PT at this time, no current need for PT upon discharge. Recommend rollator if pt is unable to locate his.        Recommendations for follow up therapy are one component of a multi-disciplinary discharge planning process, led by the attending physician.  Recommendations may be updated based on patient status, additional functional criteria and insurance authorization.  Follow Up Recommendations       Assistance Recommended at Discharge PRN  Patient can return home with the following  Assist for transportation;Help with stairs or ramp for entrance    Equipment Recommendations Rollator (4 wheels) (pt unsure of where his is after admission)  Recommendations for Other Services       Functional Status Assessment Patient has not had a recent decline in their functional status     Precautions / Restrictions Precautions Precautions: Fall;Other (comment) Precaution Comments: reports needing B hip replacements Restrictions Weight Bearing Restrictions: No      Mobility  Bed Mobility Overal bed  mobility: Modified Independent             General bed mobility comments: HOB elevated for supine>sit, ended session with pt sitting EOB    Transfers Overall transfer level: Modified independent Equipment used: Rollator (4 wheels) Transfers: Sit to/from Stand Sit to Stand: Modified independent (Device/Increase time)           General transfer comment: line management provided    Ambulation/Gait Ambulation/Gait assistance: Supervision Gait Distance (Feet): 100 Feet Assistive device: Rollator (4 wheels) Gait Pattern/deviations: Step-through pattern, Drifts right/left, Antalgic Gait velocity: grossly WFL, varying throughout     General Gait Details: varying gait speed with rollator to show different paces he could ambulate, mildly antalgic gait on RLE, reports feels at his baseline  Stairs            Wheelchair Mobility    Modified Rankin (Stroke Patients Only)       Balance Overall balance assessment: Needs assistance Sitting-balance support: No upper extremity supported, Feet supported Sitting balance-Leahy Scale: Good     Standing balance support: Bilateral upper extremity supported, During functional activity, Reliant on assistive device for balance Standing balance-Leahy Scale: Fair Standing balance comment: reliant on rollator due to B hip pain                             Pertinent Vitals/Pain Pain Assessment Pain Assessment: Faces Faces Pain Scale: Hurts little more Pain Location: chest and B hips Pain Descriptors / Indicators: Aching, Grimacing, Discomfort Pain Intervention(s): Limited activity within patient's tolerance, Monitored during session, Repositioned    Home Living Family/patient expects to  be discharged to:: Unsure Living Arrangements: Alone                 Additional Comments: friends able to check in intermittently. Pt was homeless prior to admission but reports he has money to get a house and is planning to  have a place at discharge. Utilizes rollator at baseline    Prior Function Prior Level of Function : Independent/Modified Independent             Mobility Comments: modI with rollator, denies any falls other than when he passed out from overdose causing this admission       Hand Dominance   Dominant Hand: Right    Extremity/Trunk Assessment   Upper Extremity Assessment Upper Extremity Assessment: Overall WFL for tasks assessed    Lower Extremity Assessment Lower Extremity Assessment: RLE deficits/detail;LLE deficits/detail RLE Deficits / Details: knee extension/flexion and ankle PF/DF grossly WFL, hip flexion ~2/5 limited by pain LLE Deficits / Details: knee extension/flexion and ankle PF/DF grossly WFL, hip flexion ~3-/5 limited by pain    Cervical / Trunk Assessment Cervical / Trunk Assessment: Normal  Communication   Communication: No difficulties  Cognition Arousal/Alertness: Awake/alert Behavior During Therapy: WFL for tasks assessed/performed Overall Cognitive Status: Within Functional Limits for tasks assessed                                 General Comments: A&Ox4, follows commands, mildly impulsive but appears to be at baseline        General Comments General comments (skin integrity, edema, etc.): VSS on room air    Exercises     Assessment/Plan    PT Assessment Patient does not need any further PT services  PT Problem List         PT Treatment Interventions      PT Goals (Current goals can be found in the Care Plan section)  Acute Rehab PT Goals Patient Stated Goal: go home PT Goal Formulation: All assessment and education complete, DC therapy    Frequency       Co-evaluation               AM-PAC PT "6 Clicks" Mobility  Outcome Measure Help needed turning from your back to your side while in a flat bed without using bedrails?: None Help needed moving from lying on your back to sitting on the side of a flat bed  without using bedrails?: None Help needed moving to and from a bed to a chair (including a wheelchair)?: None Help needed standing up from a chair using your arms (e.g., wheelchair or bedside chair)?: None Help needed to walk in hospital room?: A Little Help needed climbing 3-5 steps with a railing? : A Little 6 Click Score: 22    End of Session Equipment Utilized During Treatment: Gait belt Activity Tolerance: Patient tolerated treatment well Patient left: in bed;with call bell/phone within reach Nurse Communication: Mobility status PT Visit Diagnosis: Other abnormalities of gait and mobility (R26.89)    Time: 1610-9604 PT Time Calculation (min) (ACUTE ONLY): 19 min   Charges:   PT Evaluation $PT Eval Low Complexity: 1 Low          Lindalou Hose, PT DPT Acute Rehabilitation Services Office [phone]   Leonie Man 11/28/2022, 4:06 PM

## 2022-11-29 DIAGNOSIS — T50901A Poisoning by unspecified drugs, medicaments and biological substances, accidental (unintentional), initial encounter: Secondary | ICD-10-CM | POA: Diagnosis not present

## 2022-11-29 LAB — COMPREHENSIVE METABOLIC PANEL
ALT: 22 U/L (ref 0–44)
AST: 36 U/L (ref 15–41)
Albumin: 2.6 g/dL — ABNORMAL LOW (ref 3.5–5.0)
Alkaline Phosphatase: 100 U/L (ref 38–126)
Anion gap: 8 (ref 5–15)
BUN: 15 mg/dL (ref 6–20)
CO2: 22 mmol/L (ref 22–32)
Calcium: 8.4 mg/dL — ABNORMAL LOW (ref 8.9–10.3)
Chloride: 97 mmol/L — ABNORMAL LOW (ref 98–111)
Creatinine, Ser: 1.05 mg/dL (ref 0.61–1.24)
GFR, Estimated: >60 mL/min (ref >60)
Glucose, Bld: 108 mg/dL — ABNORMAL HIGH (ref 70–99)
Potassium: 3.6 mmol/L (ref 3.5–5.1)
Sodium: 127 mmol/L — ABNORMAL LOW (ref 135–145)
Total Bilirubin: 0.9 mg/dL (ref 0.3–1.2)
Total Protein: 6.4 g/dL — ABNORMAL LOW (ref 6.5–8.1)

## 2022-11-29 LAB — CBC WITH DIFFERENTIAL/PLATELET
Abs Immature Granulocytes: 0.12 10*3/uL — ABNORMAL HIGH (ref 0.00–0.07)
Basophils Absolute: 0 10*3/uL (ref 0.0–0.1)
Basophils Relative: 1 %
Eosinophils Absolute: 0.1 10*3/uL (ref 0.0–0.5)
Eosinophils Relative: 1 %
HCT: 29 % — ABNORMAL LOW (ref 39.0–52.0)
Hemoglobin: 9.7 g/dL — ABNORMAL LOW (ref 13.0–17.0)
Immature Granulocytes: 2 %
Lymphocytes Relative: 15 %
Lymphs Abs: 1.1 10*3/uL (ref 0.7–4.0)
MCH: 31.3 pg (ref 26.0–34.0)
MCHC: 33.4 g/dL (ref 30.0–36.0)
MCV: 93.5 fL (ref 80.0–100.0)
Monocytes Absolute: 1 10*3/uL (ref 0.1–1.0)
Monocytes Relative: 13 %
Neutro Abs: 5.2 10*3/uL (ref 1.7–7.7)
Neutrophils Relative %: 68 %
Platelets: 250 10*3/uL (ref 150–400)
RBC: 3.1 MIL/uL — ABNORMAL LOW (ref 4.22–5.81)
RDW: 15 % (ref 11.5–15.5)
WBC: 7.5 10*3/uL (ref 4.0–10.5)
nRBC: 0.3 % — ABNORMAL HIGH (ref 0.0–0.2)

## 2022-11-29 LAB — UREA NITROGEN, URINE: Urea Nitrogen, Ur: 608 mg/dL

## 2022-11-29 LAB — GLUCOSE, CAPILLARY
Glucose-Capillary: 150 mg/dL — ABNORMAL HIGH (ref 70–99)
Glucose-Capillary: 108 mg/dL — ABNORMAL HIGH (ref 70–99)
Glucose-Capillary: 218 mg/dL — ABNORMAL HIGH (ref 70–99)

## 2022-11-29 LAB — MAGNESIUM: Magnesium: 1.3 mg/dL — ABNORMAL LOW (ref 1.7–2.4)

## 2022-11-29 LAB — BRAIN NATRIURETIC PEPTIDE: B Natriuretic Peptide: 70.3 pg/mL (ref 0.0–100.0)

## 2022-11-29 MED ORDER — SODIUM CHLORIDE 1 G PO TABS
1.0000 g | ORAL_TABLET | Freq: Two times a day (BID) | ORAL | Status: AC
  Administered 2022-11-29: 1 g via ORAL
  Filled 2022-11-29: qty 1

## 2022-11-29 MED ORDER — MAGNESIUM SULFATE 2 GM/50ML IV SOLN
2.0000 g | Freq: Once | INTRAVENOUS | Status: AC
  Administered 2022-11-29: 2 g via INTRAVENOUS
  Filled 2022-11-29: qty 50

## 2022-11-29 MED ORDER — MAGNESIUM SULFATE 4 GM/100ML IV SOLN
4.0000 g | Freq: Once | INTRAVENOUS | Status: AC
  Administered 2022-11-29: 4 g via INTRAVENOUS
  Filled 2022-11-29: qty 100

## 2022-11-29 MED ORDER — HYDRALAZINE HCL 20 MG/ML IJ SOLN: 10.0000 mg | Freq: Three times a day (TID) | INTRAMUSCULAR | Status: DC | PRN

## 2022-11-29 NOTE — Progress Notes (Signed)
PROGRESS NOTE                                                                                                                                                                                                             Patient Demographics:    Kevin [name], is a 52 y.o. male, DOB - 1970-11-04, NWG:[id number]  Outpatient Primary MD for the patient is Hoy Register, MD    LOS - 2  Admit date - 11/26/2022    Chief Complaint  Patient presents with   Drug Overdose       Brief Narrative (HPI from H&P)   52 y.o. male with medical history significant of ETOH dependence/polysubstance abuse and HTN presenting with overdose.  He was found down at a gas station and responded to Narcan in the field.   He reports that he used crack cocaine and "must have passed out."  He acknowledges using this way too often, almost daily.  Also drinking way too much ETOH.  Denies use of heroin, thinks maybe the cocaine was laced with other drugs.  He has never been to rehab but may consider it, although he prefers to stop on his own if possible.  He is currently homeless.  He has L lower chest/rib pain    Subjective:   Patient in bed, appears comfortable, denies any headache, no fever, no chest pain or pressure, no shortness of breath , no abdominal pain. No new focal weakness.    Assessment  & Plan :   Recreational polysubstance abuse with cocaine and fentanyl causing metabolic encephalopathy and syncope. He was found unresponsive in the field required Narcan and CPR briefly, unclear whether he had lost pulse, appears stable now, minimal elevation of troponin which is expected with CPR, troponin trend is in non-ACS pattern, EKG stable, minimal pleuritic chest pain of the rib cage post CPR.  Strictly counseled to abstain from recreational drug abuse, stable echocardiogram, monitor on telemetry.  Monitor for any withdrawal.  HTN - Meds adjusted for  better control.  Hypokalemia and hypomagnesemia.  Replaced.   Prolonged QTc upon admission.  Resolved.  Hyponatremia.  Labs suggestive of SIADH, placed on fluid restriction, gentle Lasix - salt pills, if fails [place] on 11/30/2022.       Condition - Fair  Family Communication  :  None present  Code Status :  Full  Consults  :  None  PUD Prophylaxis :    Procedures  :     CT head.  Nonacute.  Echocardiogram - 1. Left ventricular ejection fraction, by estimation, is 60 to 65%. The left ventricle has normal function. The left ventricle has no regional wall motion abnormalities. There is severe left ventricular hypertrophy. Left ventricular diastolic parameters  are consistent with Grade I diastolic dysfunction (impaired relaxation).  2. Right ventricular systolic function is normal. The right ventricular size is normal.  3. The mitral valve is normal in structure. No evidence of mitral valve regurgitation.  4. The aortic valve is grossly normal. Aortic valve regurgitation is not visualized.  5. The inferior vena cava not well visualized.      Disposition Plan  :    Status is: Inpatient  DVT Prophylaxis  :    enoxaparin (LOVENOX) injection 40 mg Start: 11/27/22 1000    Lab Results  Component Value Date   PLT 250 11/29/2022    Diet :  Diet Order             Diet Heart Room service appropriate? No; Fluid consistency: Thin; Fluid restriction: 1200 mL Fluid  Diet effective now                    Inpatient Medications  Scheduled Meds:  amLODipine  10 mg Oral Daily   aspirin  81 mg Oral Daily   docusate sodium  100 mg Oral BID   enoxaparin (LOVENOX) injection  40 mg Subcutaneous Q24H   folic acid  1 mg Oral Daily   hydrALAZINE  50 mg Oral Q8H   insulin aspart  0-5 Units Subcutaneous QHS   insulin aspart  0-9 Units Subcutaneous TID WC   isosorbide mononitrate  30 mg Oral Daily   losartan  25 mg Oral Daily   multivitamin with minerals  1 tablet Oral Daily    sodium chloride flush  3 mL Intravenous Q12H   sucralfate  1 g Oral TID WC & HS   thiamine  100 mg Oral Daily   Continuous Infusions:  magnesium sulfate bolus IVPB     magnesium sulfate bolus IVPB 4 g (11/29/22 0703)   PRN Meds:.acetaminophen, albuterol, bisacodyl, cloNIDine, hydrALAZINE, mouth rinse, polyethylene glycol  Antibiotics  :    Anti-infectives (From admission, onward)    None         Objective:   Vitals:   11/28/22 2000 11/28/22 2154 11/28/22 2333 11/29/22 0446  BP: 138/83 112/71  (!) 133/90  Pulse: 97  96 77  Resp:   18 18  Temp:   99 F (37.2 C) 99.1 F (37.3 C)  TempSrc:   Oral Oral  SpO2:   100% 100%  Weight:      Height:        Wt Readings from Last 3 Encounters:  11/26/22 70.9 kg  07/01/21 69.8 kg  07/04/18 74.8 kg     Intake/Output Summary (Last 24 hours) at 11/29/2022 0842 Last data filed at 11/29/2022 0445 Gross per 24 hour  Intake 1303 ml  Output 1625 ml  Net -322 ml     Physical Exam  Awake Alert, No new F.N deficits, Normal affect [place].AT,PERRAL Supple Neck, No JVD,   Symmetrical Chest wall movement, Good air movement bilaterally, CTAB RRR,No Gallops,Rubs or new Murmurs,  +ve B.Sounds, Abd Soft, No tenderness,   No Cyanosis, Clubbing or edema       Data Review:  Recent Labs  Lab 11/26/22 2315 11/27/22 0900 11/28/22 0218 11/29/22 0332  WBC 5.8 8.1 7.2 7.5  HGB 11.8* 11.7* 11.1* 9.7*  HCT 35.1* 34.2* 32.5* 29.0*  PLT 299 281 276 250  MCV 95.9 95.5 94.2 93.5  MCH 32.2 32.7 32.2 31.3  MCHC 33.6 34.2 34.2 33.4  RDW 15.1 15.0 15.0 15.0  LYMPHSABS 1.3 1.0  --  1.1  MONOABS 0.7 0.8  --  1.0  EOSABS 0.1 0.1  --  0.1  BASOSABS 0.1 0.0  --  0.0    Recent Labs  Lab 11/26/22 2315 11/27/22 0553 11/27/22 0900 11/27/22 1055 11/28/22 0218 11/28/22 0618 11/29/22 0332  NA 133*  --  132*  --  129*  --  127*  K 2.8*  --  3.5  --  3.2*  --  3.6  CL 99  --  99  --  95*  --  97*  CO2 18*  --  22  --  24  --  22  ANIONGAP  16*  --  11  --  10  --  8  GLUCOSE 189*  --  83  --  105*  --  108*  BUN 10  --  7  --  12  --  15  CREATININE 1.36*  --  0.82  --  1.11  --  1.05  AST 96*  --  70*  --   --   --  36  ALT 30  --  30  --   --   --  22  ALKPHOS 119  --  119  --   --   --  100  BILITOT 1.4*  --  0.8  --   --   --  0.9  ALBUMIN 3.6  --  3.2*  --   --   --  2.6*  LATICACIDVEN  --   --  2.1* 2.3*  --   --   --   HGBA1C  --  4.6*  --   --   --   --   --   BNP  --   --   --   --   --  155.1* 70.3  MG  --  1.4*  --   --   --  1.1* 1.3*  CALCIUM 8.1*  --  8.1*  --  8.5*  --  8.4*      Recent Labs  Lab 11/26/22 2315 11/27/22 0553 11/27/22 0900 11/27/22 1055 11/28/22 0218 11/28/22 0618 11/29/22 0332  LATICACIDVEN  --   --  2.1* 2.3*  --   --   --   HGBA1C  --  4.6*  --   --   --   --   --   BNP  --   --   --   --   --  155.1* 70.3  MG  --  1.4*  --   --   --  1.1* 1.3*  CALCIUM 8.1*  --  8.1*  --  8.5*  --  8.4*      Lab Results  Component Value Date   HGBA1C 4.6 (L) 11/27/2022      Radiology Reports ECHOCARDIOGRAM COMPLETE  Result Date: 11/28/2022    ECHOCARDIOGRAM REPORT   Patient Name:   BENJAMEN NAPIERALSKI Date of Exam: 11/28/2022 Medical Rec #:  [id number]           Height:       65.0 in Accession #:  [id number]          Weight:       156.3 lb Date of Birth:  06-28-1970           BSA:          1.781 m Patient Age:    51 years            BP:           159/113 mmHg Patient Gender: M                   HR:           98 bpm. Exam Location:  Inpatient Procedure: 2D Echo, Color Doppler and Cardiac Doppler Indications:    Chest pain  History:        Patient has no prior history of Echocardiogram examinations.                 Risk Factors:Hypertension. Polysubstance Abuse, ETOH Abuse,                 Overdose yesterday.  Sonographer:    Milbert Coulter Referring Phys: Effie Shy [facility] [place] [facility] IMPRESSIONS  1. Left ventricular ejection fraction, by estimation, is 60 to 65%. The left ventricle has normal function.  The left ventricle has no regional wall motion abnormalities. There is severe left ventricular hypertrophy. Left ventricular diastolic parameters  are consistent with Grade I diastolic dysfunction (impaired relaxation).  2. Right ventricular systolic function is normal. The right ventricular size is normal.  3. The mitral valve is normal in structure. No evidence of mitral valve regurgitation.  4. The aortic valve is grossly normal. Aortic valve regurgitation is not visualized.  5. The inferior vena cava not well visualized. FINDINGS  Left Ventricle: Left ventricular ejection fraction, by estimation, is 60 to 65%. The left ventricle has normal function. The left ventricle has no regional wall motion abnormalities. The left ventricular internal cavity size was normal in size. There is  severe left ventricular hypertrophy. Left ventricular diastolic parameters are consistent with Grade I diastolic dysfunction (impaired relaxation). Right Ventricle: The right ventricular size is normal. Right ventricular systolic function is normal. Left Atrium: Left atrial size was normal in size. Right Atrium: Right atrial size was normal in size. Pericardium: There is no evidence of pericardial effusion. Mitral Valve: The mitral valve is normal in structure. No evidence of mitral valve regurgitation. Tricuspid Valve: The tricuspid valve is normal in structure. Tricuspid valve regurgitation is not demonstrated. Aortic Valve: The aortic valve is grossly normal. Aortic valve regurgitation is not visualized. Aortic valve mean gradient measures 7.0 mmHg. Aortic valve peak gradient measures 13.1 mmHg. Aortic valve area, by VTI measures 2.63 cm. Pulmonic Valve: Pulmonic valve regurgitation is not visualized. Aorta: The aortic root and ascending aorta are structurally normal, with no evidence of dilitation. Venous: The inferior vena cava not well visualized. IAS/Shunts: The interatrial septum was not well visualized.  LEFT VENTRICLE PLAX  2D LVIDd:         4.00 cm   Diastology LVIDs:         2.40 cm   LV e' medial:    7.46 cm/s LV PW:         1.40 cm   LV E/e' medial:  6.2 LV IVS:        1.90 cm   LV e' lateral:   10.60 cm/s LVOT diam:     1.90 cm   LV E/e' lateral: 4.3 LV SV:  65 LV SV Index:   37 LVOT Area:     2.84 cm  RIGHT VENTRICLE RV S prime:     23.90 cm/s TAPSE (M-mode): 2.2 cm LEFT ATRIUM             Index LA diam:        2.80 cm 1.57 cm/m LA Vol (A2C):   37.8 ml 21.22 ml/m LA Vol (A4C):   37.8 ml 21.22 ml/m LA Biplane Vol: 38.3 ml 21.50 ml/m  AORTIC VALVE AV Area (Vmax):    2.65 cm AV Area (Vmean):   2.50 cm AV Area (VTI):     2.63 cm AV Vmax:           181.00 cm/s AV Vmean:          117.000 cm/s AV VTI:            0.249 m AV Peak Grad:      13.1 mmHg AV Mean Grad:      7.0 mmHg LVOT Vmax:         169.00 cm/s LVOT Vmean:        103.000 cm/s LVOT VTI:          0.231 m LVOT/AV VTI ratio: 0.93  AORTA Ao Asc diam: 3.30 cm MITRAL VALVE MV Area (PHT): 4.80 cm     SHUNTS MV Decel Time: 158 msec     Systemic VTI:  0.23 m MV E velocity: 46.00 cm/s   Systemic Diam: 1.90 cm MV A velocity: 111.00 cm/s MV E/A ratio:  0.41 Photographer signed by Carolan Clines Signature Date/Time: 11/28/2022/1:37:44 PM    Final    CT Head Wo Contrast  Result Date: 11/27/2022 CLINICAL DATA:  ams, polysubstance use, found down EXAM: CT HEAD WITHOUT CONTRAST TECHNIQUE: Contiguous axial images were obtained from the base of the skull through the vertex without intravenous contrast. RADIATION DOSE REDUCTION: This exam was performed according to the departmental dose-optimization program which includes automated exposure control, adjustment of the mA and/or kV according to patient size and/or use of iterative reconstruction technique. COMPARISON:  07/04/2018 FINDINGS: Brain: No acute intracranial abnormality. Specifically, no hemorrhage, hydrocephalus, mass lesion, acute infarction, or significant intracranial injury. Mild cerebral atrophy.  Vascular: No hyperdense vessel or unexpected calcification. Skull: No acute calvarial abnormality. Sinuses/Orbits: No acute findings Other: None IMPRESSION: Mild atrophy.  No acute intracranial abnormality. Electronically Signed   By: Charlett Nose M.D.   On: 11/27/2022 00:22   DG Chest Portable 1 View  Result Date: 11/26/2022 CLINICAL DATA:  Recent drug overdose EXAM: PORTABLE CHEST 1 VIEW COMPARISON:  07/04/2021 CT FINDINGS: Cardiac shadow is prominent but accentuated by the frontal technique. Lungs are clear. No bony abnormality is noted. IMPRESSION: No acute abnormality seen. Electronically Signed   By: Alcide Clever M.D.   On: 11/26/2022 23:31      Signature  -   Susa Raring M.D on 11/29/2022 at 8:42 AM   -  To page go to [email or web address]

## 2022-11-29 NOTE — TOC Initial Note (Addendum)
Transition of Care [facility]) - Initial/Assessment Note    Patient Details  Name: Kevin [name] MRN: [id number] Date of Birth: 1971/02/22  [facility] CM/SW Contact:    Ronny Bacon, RN Phone Number: 11/29/2022, 9:33 AM  Clinical Narrative:  Spoke with patient by phone. Patient currently homeless, declined shelter options. Patient does not want to stay with friends, " They do drugs. That's why I am in here." Patient reports that he is expecting his disability check and plans to use it for motel/hotel. Patient says one of his "associates" can transport him when he is discharged.  Rollator ordered- [employer].                Expected Discharge Plan: Home/Self Care Barriers to Discharge: Continued Medical Work up   Patient Goals and CMS Choice            Expected Discharge Plan and Services   Discharge Planning Services: CM Consult   Living arrangements for the past 2 months: Homeless                 DME Arranged: Walker rolling with seat DME Agency: [employer] Date DME Agency Contacted: 11/29/22 Time DME Agency Contacted: [phone] Representative spoke with at DME Agency: Vaughan Basta            Prior Living Arrangements/Services Living arrangements for the past 2 months: Homeless Lives with:: Self Patient language and need for interpreter reviewed:: Yes        Need for Family Participation in Patient Care: No (Comment) Care giver support system in place?: No (comment)   Criminal Activity/Legal Involvement Pertinent to Current Situation/Hospitalization: No - Comment as needed  Activities of Daily Living Home Assistive Devices/Equipment: Walker (specify type) (four wheel walker) ADL Screening (condition at time of admission) Patient's cognitive ability adequate to safely complete daily activities?: Yes Is the patient deaf or have difficulty hearing?: No Does the patient have difficulty seeing, even when wearing glasses/contacts?: No Does the  patient have difficulty concentrating, remembering, or making decisions?: No Patient able to express need for assistance with ADLs?: Yes Does the patient have difficulty dressing or bathing?: No Independently performs ADLs?: No Communication: Independent Is this a change from baseline?: Pre-admission baseline Dressing (OT): Independent Is this a change from baseline?: Pre-admission baseline Grooming: Independent Is this a change from baseline?: Pre-admission baseline Feeding: Independent Is this a change from baseline?: Pre-admission baseline Bathing: Independent Is this a change from baseline?: Pre-admission baseline Toileting: Independent Is this a change from baseline?: Pre-admission baseline In/Out Bed: Independent Is this a change from baseline?: Pre-admission baseline Walks in Home: Independent Is this a change from baseline?: Pre-admission baseline Does the patient have difficulty walking or climbing stairs?: Yes Weakness of Legs: Right Weakness of Arms/Hands: Left  Permission Sought/Granted Permission sought to share information with : Case Manager                Emotional Assessment           Psych Involvement: No (comment)  Admission diagnosis:  Hypokalemia [E87.6] AKI (acute kidney injury) (HCC) [N17.9] Accidental overdose, initial encounter [T50.901A] Overdose of undetermined intent, initial encounter [T50.904A] Patient Active Problem List   Diagnosis Date Noted   Accidental overdose, initial encounter 11/27/2022   Prolonged Q-T interval on ECG 11/27/2022   Elevated liver enzymes    Esophageal perforation    Pneumomediastinum (HCC) 07/01/2021   Polysubstance use disorder 07/01/2021   Hypertension 07/01/2021   Leukocytosis 07/01/2021  Hematemesis 07/01/2021   Alcohol dependence (HCC) 02/19/2013   Major depression, recurrent (HCC) 02/19/2013   PCP:  Hoy Register, MD Pharmacy:   [id number] [place], [place] - [phone] AT [facility] & [address] [place] [address] Phone: [phone] Fax: [phone]  [facility], [place] - [address] [facility] [address] [place] [address] Phone: [phone] Fax: [phone]  [facility] [address] [place] [address] Phone: [phone] Fax: [phone]     Social Determinants of Health (SDOH) Social History: SDOH Screenings   Food Insecurity: Food Insecurity Present (11/27/2022)  Housing: Medium Risk (11/27/2022)  Transportation Needs: Unmet Transportation Needs (11/27/2022)  Utilities: At Risk (11/27/2022)  Tobacco Use: High Risk (11/26/2022)   SDOH Interventions:     Readmission Risk Interventions    07/07/2021    2:14 PM  Readmission Risk Prevention Plan  Post Dischage Appt Complete  Medication Screening Complete  Transportation Screening Complete

## 2022-11-30 DIAGNOSIS — T50901A Poisoning by unspecified drugs, medicaments and biological substances, accidental (unintentional), initial encounter: Secondary | ICD-10-CM | POA: Diagnosis not present

## 2022-11-30 LAB — CBC WITH DIFFERENTIAL/PLATELET
Abs Immature Granulocytes: 0.16 10*3/uL — ABNORMAL HIGH (ref 0.00–0.07)
Basophils Absolute: 0 10*3/uL (ref 0.0–0.1)
Basophils Relative: 1 %
Eosinophils Absolute: 0.1 10*3/uL (ref 0.0–0.5)
Eosinophils Relative: 2 %
HCT: 31.1 % — ABNORMAL LOW (ref 39.0–52.0)
Hemoglobin: 10.6 g/dL — ABNORMAL LOW (ref 13.0–17.0)
Immature Granulocytes: 2 %
Lymphocytes Relative: 14 %
Lymphs Abs: 1.1 10*3/uL (ref 0.7–4.0)
MCH: 31.8 pg (ref 26.0–34.0)
MCHC: 34.1 g/dL (ref 30.0–36.0)
MCV: 93.4 fL (ref 80.0–100.0)
Monocytes Absolute: 1 10*3/uL (ref 0.1–1.0)
Monocytes Relative: 13 %
Neutro Abs: 5.2 10*3/uL (ref 1.7–7.7)
Neutrophils Relative %: 68 %
Platelets: 280 10*3/uL (ref 150–400)
RBC: 3.33 MIL/uL — ABNORMAL LOW (ref 4.22–5.81)
RDW: 14.9 % (ref 11.5–15.5)
WBC: 7.5 10*3/uL (ref 4.0–10.5)
nRBC: 0 % (ref 0.0–0.2)

## 2022-11-30 LAB — COMPREHENSIVE METABOLIC PANEL
ALT: 44 U/L (ref 0–44)
AST: 106 U/L — ABNORMAL HIGH (ref 15–41)
Albumin: 2.9 g/dL — ABNORMAL LOW (ref 3.5–5.0)
Alkaline Phosphatase: 110 U/L (ref 38–126)
Anion gap: 8 (ref 5–15)
BUN: 12 mg/dL (ref 6–20)
CO2: 22 mmol/L (ref 22–32)
Calcium: 8.5 mg/dL — ABNORMAL LOW (ref 8.9–10.3)
Chloride: 96 mmol/L — ABNORMAL LOW (ref 98–111)
Creatinine, Ser: 0.9 mg/dL (ref 0.61–1.24)
GFR, Estimated: >60 mL/min (ref >60)
Glucose, Bld: 96 mg/dL (ref 70–99)
Potassium: 3.8 mmol/L (ref 3.5–5.1)
Sodium: 126 mmol/L — ABNORMAL LOW (ref 135–145)
Total Bilirubin: 0.9 mg/dL (ref 0.3–1.2)
Total Protein: 6.9 g/dL (ref 6.5–8.1)

## 2022-11-30 LAB — GLUCOSE, CAPILLARY
Glucose-Capillary: 89 mg/dL (ref 70–99)
Glucose-Capillary: 112 mg/dL — ABNORMAL HIGH (ref 70–99)
Glucose-Capillary: 126 mg/dL — ABNORMAL HIGH (ref 70–99)

## 2022-11-30 LAB — BRAIN NATRIURETIC PEPTIDE: B Natriuretic Peptide: 53.9 pg/mL (ref 0.0–100.0)

## 2022-11-30 LAB — MAGNESIUM: Magnesium: 1.2 mg/dL — ABNORMAL LOW (ref 1.7–2.4)

## 2022-11-30 MED ORDER — TOLVAPTAN 15 MG PO TABS
15.0000 mg | ORAL_TABLET | Freq: Once | ORAL | Status: AC
  Administered 2022-11-30: 15 mg via ORAL
  Filled 2022-11-30: qty 1

## 2022-11-30 MED ORDER — MAGNESIUM SULFATE 4 GM/100ML IV SOLN
4.0000 g | Freq: Two times a day (BID) | INTRAVENOUS | Status: AC
  Administered 2022-11-30 (×2): 4 g via INTRAVENOUS
  Filled 2022-11-30 (×2): qty 100

## 2022-11-30 NOTE — Progress Notes (Signed)
PROGRESS NOTE                                                                                                                                                                                                             Patient Demographics:    Kevin [name], is a 52 y.o. male, DOB - 01/06/1971, ZOX:[id number]  Outpatient Primary MD for the patient is Hoy Register, MD    LOS - 3  Admit date - 11/26/2022    Chief Complaint  Patient presents with   Drug Overdose       Brief Narrative (HPI from H&P)   52 y.o. male with medical history significant of ETOH dependence/polysubstance abuse and HTN presenting with overdose.  He was found down at a gas station and responded to Narcan in the field.   He reports that he used crack cocaine and "must have passed out."  He acknowledges using this way too often, almost daily.  Also drinking way too much ETOH.  Denies use of heroin, thinks maybe the cocaine was laced with other drugs.  He has never been to rehab but may consider it, although he prefers to stop on his own if possible.  He is currently homeless.  He has L lower chest/rib pain    Subjective:   Patient in bed, appears comfortable, denies any headache, no fever, no chest pain or pressure, no shortness of breath , no abdominal pain. No focal weakness.,  Does have some generalized weakness.   Assessment  & Plan :   Recreational polysubstance abuse with cocaine and fentanyl causing metabolic encephalopathy and syncope. He was found unresponsive in the field required Narcan and CPR briefly, unclear whether he had lost pulse, appears stable now, minimal elevation of troponin which is expected with CPR, troponin trend is in non-ACS pattern, EKG stable, minimal pleuritic chest pain of the rib cage post CPR.  Strictly counseled to abstain from recreational drug abuse, stable echocardiogram, monitor on telemetry.  Monitor for any  withdrawal.  HTN - Meds adjusted for better control.  Hypokalemia and hypomagnesemia.  Replaced.   Prolonged QTc upon admission.  Resolved.  Hyponatremia.  Labs suggestive of SIADH, failed fluid restriction, Lasix and salt pills, sodium still dropping, [place] on 11/30/2022.  If sodium levels improved discharge on 12/01/2022.  Is developing some generalized weakness.  Condition - Fair  Family Communication  :  None present  Code Status :  Full  Consults  :  None  PUD Prophylaxis :    Procedures  :     CT head.  Nonacute.  Echocardiogram - 1. Left ventricular ejection fraction, by estimation, is 60 to 65%. The left ventricle has normal function. The left ventricle has no regional wall motion abnormalities. There is severe left ventricular hypertrophy. Left ventricular diastolic parameters  are consistent with Grade I diastolic dysfunction (impaired relaxation).  2. Right ventricular systolic function is normal. The right ventricular size is normal.  3. The mitral valve is normal in structure. No evidence of mitral valve regurgitation.  4. The aortic valve is grossly normal. Aortic valve regurgitation is not visualized.  5. The inferior vena cava not well visualized.      Disposition Plan  :    Status is: Inpatient  DVT Prophylaxis  :    enoxaparin (LOVENOX) injection 40 mg Start: 11/27/22 1000    Lab Results  Component Value Date   PLT 280 11/30/2022    Diet :  Diet Order             Diet Heart Room service appropriate? No; Fluid consistency: Thin; Fluid restriction: 1200 mL Fluid  Diet effective now                    Inpatient Medications  Scheduled Meds:  amLODipine  10 mg Oral Daily   aspirin  81 mg Oral Daily   docusate sodium  100 mg Oral BID   enoxaparin (LOVENOX) injection  40 mg Subcutaneous Q24H   folic acid  1 mg Oral Daily   hydrALAZINE  50 mg Oral Q8H   insulin aspart  0-5 Units Subcutaneous QHS   insulin aspart  0-9 Units Subcutaneous  TID WC   isosorbide mononitrate  30 mg Oral Daily   losartan  25 mg Oral Daily   multivitamin with minerals  1 tablet Oral Daily   sodium chloride flush  3 mL Intravenous Q12H   sodium chloride  1 g Oral BID WC   sucralfate  1 g Oral TID WC & HS   thiamine  100 mg Oral Daily   tolvaptan  15 mg Oral Once   Continuous Infusions:  magnesium sulfate bolus IVPB 4 g (11/30/22 0622)   PRN Meds:.acetaminophen, albuterol, bisacodyl, cloNIDine, hydrALAZINE, mouth rinse, polyethylene glycol  Antibiotics  :    Anti-infectives (From admission, onward)    None         Objective:   Vitals:   11/30/22 0000 11/30/22 0009 11/30/22 0342 11/30/22 0558  BP: 131/79   (!) 148/96  Pulse: 74     Resp:      Temp:      TempSrc:  Oral Oral   SpO2:      Weight:      Height:        Wt Readings from Last 3 Encounters:  11/26/22 70.9 kg  07/01/21 69.8 kg  07/04/18 74.8 kg     Intake/Output Summary (Last 24 hours) at 11/30/2022 0722 Last data filed at 11/30/2022 0500 Gross per 24 hour  Intake 293 ml  Output 2275 ml  Net -1982 ml     Physical Exam  Awake Alert, No new F.N deficits, Normal affect [place].AT,PERRAL Supple Neck, No JVD,   Symmetrical Chest wall movement, Good air movement bilaterally, CTAB RRR,No Gallops,Rubs or new Murmurs,  +ve B.Sounds, Abd  Soft, No tenderness,   No Cyanosis, Clubbing or edema       Data Review:    Recent Labs  Lab 11/26/22 2315 11/27/22 0900 11/28/22 0218 11/29/22 0332 11/30/22 0341  WBC 5.8 8.1 7.2 7.5 7.5  HGB 11.8* 11.7* 11.1* 9.7* 10.6*  HCT 35.1* 34.2* 32.5* 29.0* 31.1*  PLT 299 281 276 250 280  MCV 95.9 95.5 94.2 93.5 93.4  MCH 32.2 32.7 32.2 31.3 31.8  MCHC 33.6 34.2 34.2 33.4 34.1  RDW 15.1 15.0 15.0 15.0 14.9  LYMPHSABS 1.3 1.0  --  1.1 1.1  MONOABS 0.7 0.8  --  1.0 1.0  EOSABS 0.1 0.1  --  0.1 0.1  BASOSABS 0.1 0.0  --  0.0 0.0    Recent Labs  Lab 11/26/22 2315 11/27/22 0553 11/27/22 0900 11/27/22 1055 11/28/22 0218  11/28/22 0618 11/29/22 0332 11/30/22 0341  NA 133*  --  132*  --  129*  --  127* 126*  K 2.8*  --  3.5  --  3.2*  --  3.6 3.8  CL 99  --  99  --  95*  --  97* 96*  CO2 18*  --  22  --  24  --  22 22  ANIONGAP 16*  --  11  --  10  --  8 8  GLUCOSE 189*  --  83  --  105*  --  108* 96  BUN 10  --  7  --  12  --  15 12  CREATININE 1.36*  --  0.82  --  1.11  --  1.05 0.90  AST 96*  --  70*  --   --   --  36 106*  ALT 30  --  30  --   --   --  22 44  ALKPHOS 119  --  119  --   --   --  100 110  BILITOT 1.4*  --  0.8  --   --   --  0.9 0.9  ALBUMIN 3.6  --  3.2*  --   --   --  2.6* 2.9*  LATICACIDVEN  --   --  2.1* 2.3*  --   --   --   --   HGBA1C  --  4.6*  --   --   --   --   --   --   BNP  --   --   --   --   --  155.1* 70.3 53.9  MG  --  1.4*  --   --   --  1.1* 1.3* 1.2*  CALCIUM 8.1*  --  8.1*  --  8.5*  --  8.4* 8.5*      Recent Labs  Lab 11/26/22 2315 11/27/22 0553 11/27/22 0900 11/27/22 1055 11/28/22 0218 11/28/22 0618 11/29/22 0332 11/30/22 0341  LATICACIDVEN  --   --  2.1* 2.3*  --   --   --   --   HGBA1C  --  4.6*  --   --   --   --   --   --   BNP  --   --   --   --   --  155.1* 70.3 53.9  MG  --  1.4*  --   --   --  1.1* 1.3* 1.2*  CALCIUM 8.1*  --  8.1*  --  8.5*  --  8.4* 8.5*      Lab  Results  Component Value Date   HGBA1C 4.6 (L) 11/27/2022      Radiology Reports ECHOCARDIOGRAM COMPLETE  Result Date: 11/28/2022    ECHOCARDIOGRAM REPORT   Patient Name:   Kevin [name] Date of Exam: 11/28/2022 Medical Rec #:  [id number]           Height:       65.0 in Accession #:    [id number]          Weight:       156.3 lb Date of Birth:  Jun 13, 1971           BSA:          1.781 m Patient Age:    51 years            BP:           159/113 mmHg Patient Gender: M                   HR:           98 bpm. Exam Location:  Inpatient Procedure: 2D Echo, Color Doppler and Cardiac Doppler Indications:    Chest pain  History:        Patient has no prior history of  Echocardiogram examinations.                 Risk Factors:Hypertension. Polysubstance Abuse, ETOH Abuse,                 Overdose yesterday.  Sonographer:    Milbert Coulter Referring Phys: Effie Shy [facility] IMPRESSIONS  1. Left ventricular ejection fraction, by estimation, is 60 to 65%. The left ventricle has normal function. The left ventricle has no regional wall motion abnormalities. There is severe left ventricular hypertrophy. Left ventricular diastolic parameters  are consistent with Grade I diastolic dysfunction (impaired relaxation).  2. Right ventricular systolic function is normal. The right ventricular size is normal.  3. The mitral valve is normal in structure. No evidence of mitral valve regurgitation.  4. The aortic valve is grossly normal. Aortic valve regurgitation is not visualized.  5. The inferior vena cava not well visualized. FINDINGS  Left Ventricle: Left ventricular ejection fraction, by estimation, is 60 to 65%. The left ventricle has normal function. The left ventricle has no regional wall motion abnormalities. The left ventricular internal cavity size was normal in size. There is  severe left ventricular hypertrophy. Left ventricular diastolic parameters are consistent with Grade I diastolic dysfunction (impaired relaxation). Right Ventricle: The right ventricular size is normal. Right ventricular systolic function is normal. Left Atrium: Left atrial size was normal in size. Right Atrium: Right atrial size was normal in size. Pericardium: There is no evidence of pericardial effusion. Mitral Valve: The mitral valve is normal in structure. No evidence of mitral valve regurgitation. Tricuspid Valve: The tricuspid valve is normal in structure. Tricuspid valve regurgitation is not demonstrated. Aortic Valve: The aortic valve is grossly normal. Aortic valve regurgitation is not visualized. Aortic valve mean gradient measures 7.0 mmHg. Aortic valve peak gradient measures 13.1 mmHg. Aortic valve  area, by VTI measures 2.63 cm. Pulmonic Valve: Pulmonic valve regurgitation is not visualized. Aorta: The aortic root and ascending aorta are structurally normal, with no evidence of dilitation. Venous: The inferior vena cava not well visualized. IAS/Shunts: The interatrial septum was not well visualized.  LEFT VENTRICLE PLAX 2D LVIDd:         4.00 cm   Diastology LVIDs:  2.40 cm   LV e' medial:    7.46 cm/s LV PW:         1.40 cm   LV E/e' medial:  6.2 LV IVS:        1.90 cm   LV e' lateral:   10.60 cm/s LVOT diam:     1.90 cm   LV E/e' lateral: 4.3 LV SV:         65 LV SV Index:   37 LVOT Area:     2.84 cm  RIGHT VENTRICLE RV S prime:     23.90 cm/s TAPSE (M-mode): 2.2 cm LEFT ATRIUM             Index LA diam:        2.80 cm 1.57 cm/m LA Vol (A2C):   37.8 ml 21.22 ml/m LA Vol (A4C):   37.8 ml 21.22 ml/m LA Biplane Vol: 38.3 ml 21.50 ml/m  AORTIC VALVE AV Area (Vmax):    2.65 cm AV Area (Vmean):   2.50 cm AV Area (VTI):     2.63 cm AV Vmax:           181.00 cm/s AV Vmean:          117.000 cm/s AV VTI:            0.249 m AV Peak Grad:      13.1 mmHg AV Mean Grad:      7.0 mmHg LVOT Vmax:         169.00 cm/s LVOT Vmean:        103.000 cm/s LVOT VTI:          0.231 m LVOT/AV VTI ratio: 0.93  AORTA Ao Asc diam: 3.30 cm MITRAL VALVE MV Area (PHT): 4.80 cm     SHUNTS MV Decel Time: 158 msec     Systemic VTI:  0.23 m MV E velocity: 46.00 cm/s   Systemic Diam: 1.90 cm MV A velocity: 111.00 cm/s MV E/A ratio:  0.41 Photographer signed by Carolan Clines Signature Date/Time: 11/28/2022/1:37:44 PM    Final    CT Head Wo Contrast  Result Date: 11/27/2022 CLINICAL DATA:  ams, polysubstance use, found down EXAM: CT HEAD WITHOUT CONTRAST TECHNIQUE: Contiguous axial images were obtained from the base of the skull through the vertex without intravenous contrast. RADIATION DOSE REDUCTION: This exam was performed according to the departmental dose-optimization program which includes automated exposure  control, adjustment of the mA and/or kV according to patient size and/or use of iterative reconstruction technique. COMPARISON:  07/04/2018 FINDINGS: Brain: No acute intracranial abnormality. Specifically, no hemorrhage, hydrocephalus, mass lesion, acute infarction, or significant intracranial injury. Mild cerebral atrophy. Vascular: No hyperdense vessel or unexpected calcification. Skull: No acute calvarial abnormality. Sinuses/Orbits: No acute findings Other: None IMPRESSION: Mild atrophy.  No acute intracranial abnormality. Electronically Signed   By: Charlett Nose M.D.   On: 11/27/2022 00:22   DG Chest Portable 1 View  Result Date: 11/26/2022 CLINICAL DATA:  Recent drug overdose EXAM: PORTABLE CHEST 1 VIEW COMPARISON:  07/04/2021 CT FINDINGS: Cardiac shadow is prominent but accentuated by the frontal technique. Lungs are clear. No bony abnormality is noted. IMPRESSION: No acute abnormality seen. Electronically Signed   By: Alcide Clever M.D.   On: 11/26/2022 23:31      Signature  -   Susa Raring M.D on 11/30/2022 at 7:22 AM   -  To page go to [email or web address]

## 2022-11-30 NOTE — TOC Progression Note (Signed)
Transition of Care [facility]) - Progression Note    Patient Details  Name: Kevin [name] MRN: [id number] Date of Birth: 05/21/1971  [facility] CM/SW Contact  Mearl Latin, LCSW Phone Number: 11/30/2022, 3:18 PM  Clinical Narrative:    CSW met with patient to ask him about Annice Pih [phone]) who called and asked to speak with CSW.  Patient does not know a Annice Pih but stated CSW could call and figure out what they wanted. Patient also getting calls from a counseling agency that was discussing housing options with him. CSW encouraged him to speak with them to see how they can assist him. Patient requested an upright walker instead of the rollator that was delivered. CSW asked RNCM about his eligibility.     Expected Discharge Plan: Home/Self Care Barriers to Discharge: Continued Medical Work up  Expected Discharge Plan and Services In-house Referral: Clinical Social Work Discharge Planning Services: CM Consult Post Acute Care Choice: Durable Medical Equipment Living arrangements for the past 2 months: Homeless                 DME Arranged: Walker rolling with seat DME Agency: [employer] Date DME Agency Contacted: 11/29/22 Time DME Agency Contacted: [phone] Representative spoke with at DME Agency: Vaughan Basta             Social Determinants of Health (SDOH) Interventions SDOH Screenings   Food Insecurity: Food Insecurity Present (11/27/2022)  Housing: Medium Risk (11/27/2022)  Transportation Needs: Unmet Transportation Needs (11/27/2022)  Utilities: At Risk (11/27/2022)  Tobacco Use: High Risk (11/26/2022)    Readmission Risk Interventions    07/07/2021    2:14 PM  Readmission Risk Prevention Plan  Post Dischage Appt Complete  Medication Screening Complete  Transportation Screening Complete

## 2022-12-01 ENCOUNTER — Other Ambulatory Visit (HOSPITAL_COMMUNITY): Payer: Self-pay

## 2022-12-01 DIAGNOSIS — T50901A Poisoning by unspecified drugs, medicaments and biological substances, accidental (unintentional), initial encounter: Secondary | ICD-10-CM | POA: Diagnosis not present

## 2022-12-01 LAB — COMPREHENSIVE METABOLIC PANEL
ALT: 53 U/L — ABNORMAL HIGH (ref 0–44)
AST: 96 U/L — ABNORMAL HIGH (ref 15–41)
Albumin: 3 g/dL — ABNORMAL LOW (ref 3.5–5.0)
Alkaline Phosphatase: 102 U/L (ref 38–126)
Anion gap: 10 (ref 5–15)
BUN: 19 mg/dL (ref 6–20)
CO2: 22 mmol/L (ref 22–32)
Calcium: 9.1 mg/dL (ref 8.9–10.3)
Chloride: 100 mmol/L (ref 98–111)
Creatinine, Ser: 1.11 mg/dL (ref 0.61–1.24)
GFR, Estimated: >60 mL/min (ref >60)
Glucose, Bld: 101 mg/dL — ABNORMAL HIGH (ref 70–99)
Potassium: 4.2 mmol/L (ref 3.5–5.1)
Sodium: 132 mmol/L — ABNORMAL LOW (ref 135–145)
Total Bilirubin: 0.7 mg/dL (ref 0.3–1.2)
Total Protein: 7.2 g/dL (ref 6.5–8.1)

## 2022-12-01 LAB — CBC WITH DIFFERENTIAL/PLATELET
Abs Immature Granulocytes: 0.31 10*3/uL — ABNORMAL HIGH (ref 0.00–0.07)
Basophils Absolute: 0.1 10*3/uL (ref 0.0–0.1)
Basophils Relative: 1 %
Eosinophils Absolute: 0.1 10*3/uL (ref 0.0–0.5)
Eosinophils Relative: 2 %
HCT: 31.5 % — ABNORMAL LOW (ref 39.0–52.0)
Hemoglobin: 10.6 g/dL — ABNORMAL LOW (ref 13.0–17.0)
Immature Granulocytes: 4 %
Lymphocytes Relative: 15 %
Lymphs Abs: 1.1 10*3/uL (ref 0.7–4.0)
MCH: 32.3 pg (ref 26.0–34.0)
MCHC: 33.7 g/dL (ref 30.0–36.0)
MCV: 96 fL (ref 80.0–100.0)
Monocytes Absolute: 1.2 10*3/uL — ABNORMAL HIGH (ref 0.1–1.0)
Monocytes Relative: 17 %
Neutro Abs: 4.4 10*3/uL (ref 1.7–7.7)
Neutrophils Relative %: 61 %
Platelets: 290 10*3/uL (ref 150–400)
RBC: 3.28 MIL/uL — ABNORMAL LOW (ref 4.22–5.81)
RDW: 15.3 % (ref 11.5–15.5)
WBC: 7.2 10*3/uL (ref 4.0–10.5)
nRBC: 0 % (ref 0.0–0.2)

## 2022-12-01 LAB — BRAIN NATRIURETIC PEPTIDE: B Natriuretic Peptide: 41 pg/mL (ref 0.0–100.0)

## 2022-12-01 LAB — MAGNESIUM: Magnesium: 1.6 mg/dL — ABNORMAL LOW (ref 1.7–2.4)

## 2022-12-01 LAB — GLUCOSE, CAPILLARY: Glucose-Capillary: 100 mg/dL — ABNORMAL HIGH (ref 70–99)

## 2022-12-01 MED ORDER — MAGNESIUM OXIDE -MG SUPPLEMENT 400 (240 MG) MG PO TABS
800.0000 mg | ORAL_TABLET | Freq: Every day | ORAL | Status: DC
  Administered 2022-12-01: 800 mg via ORAL
  Filled 2022-12-01 (×2): qty 2

## 2022-12-01 MED ORDER — MAGNESIUM SULFATE 2 GM/50ML IV SOLN
2.0000 g | Freq: Two times a day (BID) | INTRAVENOUS | Status: AC
  Administered 2022-12-01: 2 g via INTRAVENOUS
  Filled 2022-12-01: qty 50

## 2022-12-01 MED ORDER — MAGNESIUM SULFATE 4 GM/100ML IV SOLN
4.0000 g | Freq: Two times a day (BID) | INTRAVENOUS | Status: DC
  Administered 2022-12-01: 4 g via INTRAVENOUS
  Filled 2022-12-01: qty 100

## 2022-12-01 MED ORDER — MAGNESIUM OXIDE -MG SUPPLEMENT 400 (240 MG) MG PO TABS
800.0000 mg | ORAL_TABLET | Freq: Every day | ORAL | 0 refills | Status: AC
  Filled 2022-12-01: qty 120, 60d supply, fill #0

## 2022-12-01 MED ORDER — THIAMINE HCL 100 MG PO TABS
100.0000 mg | ORAL_TABLET | Freq: Every day | ORAL | 0 refills | Status: AC
  Filled 2022-12-01: qty 30, 30d supply, fill #0

## 2022-12-01 MED ORDER — HYDRALAZINE HCL 50 MG PO TABS
50.0000 mg | ORAL_TABLET | Freq: Three times a day (TID) | ORAL | 0 refills | Status: AC
  Filled 2022-12-01: qty 90, 30d supply, fill #0

## 2022-12-01 MED ORDER — ISOSORBIDE MONONITRATE ER 30 MG PO TB24
30.0000 mg | ORAL_TABLET | Freq: Every day | ORAL | 0 refills | Status: AC
  Filled 2022-12-01: qty 30, 30d supply, fill #0

## 2022-12-01 MED ORDER — FOLIC ACID 1 MG PO TABS
1.0000 mg | ORAL_TABLET | Freq: Every day | ORAL | 0 refills | Status: AC
  Filled 2022-12-01: qty 30, 30d supply, fill #0

## 2022-12-01 MED ORDER — AMLODIPINE BESYLATE 10 MG PO TABS
10.0000 mg | ORAL_TABLET | Freq: Every day | ORAL | 0 refills | Status: AC
  Filled 2022-12-01: qty 30, 30d supply, fill #0

## 2022-12-01 MED ORDER — LOSARTAN POTASSIUM 25 MG PO TABS
25.0000 mg | ORAL_TABLET | Freq: Every day | ORAL | 0 refills | Status: AC
  Filled 2022-12-01: qty 30, 30d supply, fill #0

## 2022-12-01 MED ORDER — ASPIRIN 81 MG PO CHEW
81.0000 mg | CHEWABLE_TABLET | Freq: Every day | ORAL | 0 refills | Status: AC
  Filled 2022-12-01: qty 90, 90d supply, fill #0

## 2022-12-01 NOTE — Discharge Instructions (Addendum)
Follow with Primary MD Hoy Register, MD in 7 days   Get CBC, CMP, Magnesium, 2 view Chest X ray -  checked next visit with your primary MD   Activity: As tolerated with Full fall precautions use walker/cane & assistance as needed  Disposition Home    Diet: Heart Healthy    Special Instructions: If you have smoked or chewed Tobacco  in the last 2 yrs please stop smoking, stop any regular Alcohol  and or any Recreational drug use.  On your next visit with your primary care physician please Get Medicines reviewed and adjusted.  Please request your Prim.MD to go over all Hospital Tests and Procedure/Radiological results at the follow up, please get all Hospital records sent to your Prim MD by signing hospital release before you go home.  If you experience worsening of your admission symptoms, develop shortness of breath, life threatening emergency, suicidal or homicidal thoughts you must seek medical attention immediately by calling 911 or calling your MD immediately  if symptoms less severe.  You Must read complete instructions/literature along with all the possible adverse reactions/side effects for all the Medicines you take and that have been prescribed to you. Take any new Medicines after you have completely understood and accpet all the possible adverse reactions/side effects.

## 2022-12-01 NOTE — TOC Progression Note (Addendum)
Transition of Care [facility]) - Progression Note    Patient Details  Name: Kevin [name] MRN: [id number] Date of Birth: 1970-08-26  [facility] of [facility]) CM/SW Contact  Mearl Latin, LCSW Phone Number: 12/01/2022, 9:12 AM  Clinical Narrative:    9am-Jackie with [facility] [phone]) returned call and stated that she is working on [employer] arranging a temporary hotel room for patient straight from the hospital. She requested patient receive his medications from [facility] pharmacy.   CSW met with patient and provided her info to him. He stated he was able to speak with her as well.   10:25am-CSW received call from [place]. She stated they may not be able to work out the hotel today but she spoke with patient and he has a church he can go to with a phone to communicate with her once the hotel is established. CSW placed their follow up appointment on patient's AVS (home visit where patient ends up staying).   11am-Pt going to [employer]. Taxi voucher provided.   Expected Discharge Plan: Home/Self Care Barriers to Discharge: Continued Medical Work up  Expected Discharge Plan and Services In-house Referral: Clinical Social Work Discharge Planning Services: CM Consult Post Acute Care Choice: Durable Medical Equipment Living arrangements for the past 2 months: Homeless Expected Discharge Date: 12/01/22               DME Arranged: Dan Humphreys rolling with seat DME Agency: [employer] Date DME Agency Contacted: 11/29/22 Time DME Agency Contacted: [phone] Representative spoke with at DME Agency: Vaughan Basta             Social Determinants of Health (SDOH) Interventions SDOH Screenings   Food Insecurity: Food Insecurity Present (11/27/2022)  Housing: Medium Risk (11/27/2022)  Transportation Needs: Unmet Transportation Needs (11/27/2022)  Utilities: At Risk (11/27/2022)  Tobacco Use: High Risk (11/26/2022)    Readmission Risk Interventions    07/07/2021     2:14 PM  Readmission Risk Prevention Plan  Post Dischage Appt Complete  Medication Screening Complete  Transportation Screening Complete

## 2022-12-01 NOTE — Discharge Summary (Signed)
Kevin [name]:[id number] DOB: 09/08/1970 DOA: 11/26/2022  PCP: Kevin Register, MD  Admit date: 11/26/2022  Discharge date: 12/01/2022  Admitted From: Home   Disposition:  Home   Recommendations for Outpatient Follow-up:   Follow up with PCP in 1-2 weeks  PCP Please obtain BMP/CBC, 2 view CXR in 1week,  (see Discharge instructions)   PCP Please follow up on the following pending results: Monitor BMP, magnesium closely, one-time outpatient cardiology follow-up in 7 to 10 days postdischarge.   Home Health: None   Equipment/Devices: as below  Consultations: None  Discharge Condition: Stable    CODE STATUS: Full    Diet Recommendation: Heart Healthy     Chief Complaint  Patient presents with   Drug Overdose     Brief history of present illness from the day of admission and additional interim summary    52 y.o. male with medical history significant of ETOH dependence/polysubstance abuse and HTN presenting with overdose.  He was found down at a gas station and responded to Narcan in the field.   He reports that he used crack cocaine and "must have passed out."  He acknowledges using this way too often, almost daily.  Also drinking way too much ETOH.  Denies use of heroin, thinks maybe the cocaine was laced with other drugs.  He has never been to rehab but may consider it, although he prefers to stop on his own if possible.  He is currently homeless.  He has L lower chest/rib pain                                                                    Hospital Course   Recreational polysubstance abuse with cocaine and fentanyl causing metabolic encephalopathy and syncope. He was found unresponsive in the field required Narcan and CPR briefly, unclear whether he had lost pulse, appears stable now, minimal  elevation of troponin which is expected with CPR, troponin trend is in non-ACS pattern, EKG stable, minimal pleuritic chest pain of the rib cage post CPR.  Strictly counseled to abstain from recreational drug abuse, stable echocardiogram, now symptom-free eager to go home, will be discharged home.  Strictly counseled to abstain from all recreational drug abuse.   HTN - Meds adjusted for better control.   Hypokalemia and hypomagnesemia.  Replaced.  PCP to monitor in the outpatient setting.   Prolonged QTc upon admission.  Resolved.   Hyponatremia.  Labs suggestive of SIADH, failed fluid restriction, Lasix and salt pills, sodium still dropping, given a dose of Samsca on 11/30/2022 with excellent improvement in sodium levels, requested to practice fluid restriction and follow-up with PCP in a week for repeat BMP and magnesium check.   Discharge diagnosis     Principal Problem:   Accidental overdose, initial encounter  Active Problems:   Polysubstance use disorder   Hypertension   Prolonged Q-T interval on ECG    Discharge instructions    Discharge Instructions     Diet - low sodium heart healthy   Complete by: As directed    Discharge instructions   Complete by: As directed    Follow with Primary MD Kevin Register, MD in 7 days   Get CBC, CMP, magnesium, 2 view Chest X ray -  checked next visit with your primary MD   Activity: As tolerated with Full fall precautions use walker/cane & assistance as needed  Disposition Home    Diet: Heart Healthy    Special Instructions: If you have smoked or chewed Tobacco  in the last 2 yrs please stop smoking, stop any regular Alcohol  and or any Recreational drug use.  On your next visit with your primary care physician please Get Medicines reviewed and adjusted.  Please request your Prim.MD to go over all Hospital Tests and Procedure/Radiological results at the follow up, please get all Hospital records sent to your Prim MD by signing  hospital release before you go home.  If you experience worsening of your admission symptoms, develop shortness of breath, life threatening emergency, suicidal or homicidal thoughts you must seek medical attention immediately by calling 911 or calling your MD immediately  if symptoms less severe.  You Must read complete instructions/literature along with all the possible adverse reactions/side effects for all the Medicines you take and that have been prescribed to you. Take any new Medicines after you have completely understood and accpet all the possible adverse reactions/side effects.   Increase activity slowly   Complete by: As directed        Discharge Medications   Allergies as of 12/01/2022   No Known Allergies      Medication List     TAKE these medications    amLODipine 10 MG tablet Commonly known as: NORVASC Take 1 tablet (10 mg total) by mouth daily.   aspirin 81 MG chewable tablet Chew 1 tablet (81 mg total) by mouth daily. Start taking on: December 02, 2022   folic acid 1 MG tablet Commonly known as: FOLVITE Take 1 tablet (1 mg total) by mouth daily. Start taking on: December 02, 2022   hydrALAZINE 50 MG tablet Commonly known as: APRESOLINE Take 1 tablet (50 mg total) by mouth every 8 (eight) hours.   isosorbide mononitrate 30 MG 24 hr tablet Commonly known as: IMDUR Take 1 tablet (30 mg total) by mouth daily. Start taking on: December 02, 2022   losartan 25 MG tablet Commonly known as: COZAAR Take 1 tablet (25 mg total) by mouth daily.   magnesium oxide 400 (240 Mg) MG tablet Commonly known as: MAG-OX Take 2 tablets (800 mg total) by mouth daily. Start taking on: December 02, 2022   thiamine 100 MG tablet Commonly known as: Vitamin B-1 Take 1 tablet (100 mg total) by mouth daily. Start taking on: December 02, 2022               Durable Medical Equipment  (From admission, onward)           Start     Ordered   11/29/22 [phone]  For home use only DME  Walker rolling  Once       Comments: 5 wheel  Question Answer Comment  Walker: With 5 Inch Wheels   Patient needs a walker to treat with the following condition Weakness  11/29/22 8119             Follow-up Information     Kevin Register, MD. Schedule an appointment as soon as possible for a visit in 1 week(s).   Specialty: Family Medicine Contact information: [address] [place] [address] [phone]                 Major procedures and Radiology Reports - PLEASE review detailed and final reports thoroughly  -      ECHOCARDIOGRAM COMPLETE  Result Date: 11/28/2022    ECHOCARDIOGRAM REPORT   Patient Name:   STEVESON BOLING Date of Exam: 11/28/2022 Medical Rec #:  [id number]           Height:       65.0 in Accession #:    [id number]          Weight:       156.3 lb Date of Birth:  1970/10/14           BSA:          1.781 m Patient Age:    51 years            BP:           159/113 mmHg Patient Gender: M                   HR:           98 bpm. Exam Location:  Inpatient Procedure: 2D Echo, Color Doppler and Cardiac Doppler Indications:    Chest pain  History:        Patient has no prior history of Echocardiogram examinations.                 Risk Factors:Hypertension. Polysubstance Abuse, ETOH Abuse,                 Overdose yesterday.  Sonographer:    Milbert Coulter Referring Phys: Effie Shy [facility] IMPRESSIONS  1. Left ventricular ejection fraction, by estimation, is 60 to 65%. The left ventricle has normal function. The left ventricle has no regional wall motion abnormalities. There is severe left ventricular hypertrophy. Left ventricular diastolic parameters  are consistent with Grade I diastolic dysfunction (impaired relaxation).  2. Right ventricular systolic function is normal. The right ventricular size is normal.  3. The mitral valve is normal in structure. No evidence of mitral valve regurgitation.  4. The aortic valve is grossly normal. Aortic  valve regurgitation is not visualized.  5. The inferior vena cava not well visualized. FINDINGS  Left Ventricle: Left ventricular ejection fraction, by estimation, is 60 to 65%. The left ventricle has normal function. The left ventricle has no regional wall motion abnormalities. The left ventricular internal cavity size was normal in size. There is  severe left ventricular hypertrophy. Left ventricular diastolic parameters are consistent with Grade I diastolic dysfunction (impaired relaxation). Right Ventricle: The right ventricular size is normal. Right ventricular systolic function is normal. Left Atrium: Left atrial size was normal in size. Right Atrium: Right atrial size was normal in size. Pericardium: There is no evidence of pericardial effusion. Mitral Valve: The mitral valve is normal in structure. No evidence of mitral valve regurgitation. Tricuspid Valve: The tricuspid valve is normal in structure. Tricuspid valve regurgitation is not demonstrated. Aortic Valve: The aortic valve is grossly normal. Aortic valve regurgitation is not visualized. Aortic valve mean gradient measures 7.0 mmHg. Aortic valve peak gradient measures 13.1 mmHg. Aortic  valve area, by VTI measures 2.63 cm. Pulmonic Valve: Pulmonic valve regurgitation is not visualized. Aorta: The aortic root and ascending aorta are structurally normal, with no evidence of dilitation. Venous: The inferior vena cava not well visualized. IAS/Shunts: The interatrial septum was not well visualized.  LEFT VENTRICLE PLAX 2D LVIDd:         4.00 cm   Diastology LVIDs:         2.40 cm   LV e' medial:    7.46 cm/s LV PW:         1.40 cm   LV E/e' medial:  6.2 LV IVS:        1.90 cm   LV e' lateral:   10.60 cm/s LVOT diam:     1.90 cm   LV E/e' lateral: 4.3 LV SV:         65 LV SV Index:   37 LVOT Area:     2.84 cm  RIGHT VENTRICLE RV S prime:     23.90 cm/s TAPSE (M-mode): 2.2 cm LEFT ATRIUM             Index LA diam:        2.80 cm 1.57 cm/m LA Vol (A2C):    37.8 ml 21.22 ml/m LA Vol (A4C):   37.8 ml 21.22 ml/m LA Biplane Vol: 38.3 ml 21.50 ml/m  AORTIC VALVE AV Area (Vmax):    2.65 cm AV Area (Vmean):   2.50 cm AV Area (VTI):     2.63 cm AV Vmax:           181.00 cm/s AV Vmean:          117.000 cm/s AV VTI:            0.249 m AV Peak Grad:      13.1 mmHg AV Mean Grad:      7.0 mmHg LVOT Vmax:         169.00 cm/s LVOT Vmean:        103.000 cm/s LVOT VTI:          0.231 m LVOT/AV VTI ratio: 0.93  AORTA Ao Asc diam: 3.30 cm MITRAL VALVE MV Area (PHT): 4.80 cm     SHUNTS MV Decel Time: 158 msec     Systemic VTI:  0.23 m MV E velocity: 46.00 cm/s   Systemic Diam: 1.90 cm MV A velocity: 111.00 cm/s MV E/A ratio:  0.41 Photographer signed by Carolan Clines Signature Date/Time: 11/28/2022/1:37:44 PM    Final    CT Head Wo Contrast  Result Date: 11/27/2022 CLINICAL DATA:  ams, polysubstance use, found down EXAM: CT HEAD WITHOUT CONTRAST TECHNIQUE: Contiguous axial images were obtained from the base of the skull through the vertex without intravenous contrast. RADIATION DOSE REDUCTION: This exam was performed according to the departmental dose-optimization program which includes automated exposure control, adjustment of the mA and/or kV according to patient size and/or use of iterative reconstruction technique. COMPARISON:  07/04/2018 FINDINGS: Brain: No acute intracranial abnormality. Specifically, no hemorrhage, hydrocephalus, mass lesion, acute infarction, or significant intracranial injury. Mild cerebral atrophy. Vascular: No hyperdense vessel or unexpected calcification. Skull: No acute calvarial abnormality. Sinuses/Orbits: No acute findings Other: None IMPRESSION: Mild atrophy.  No acute intracranial abnormality. Electronically Signed   By: Charlett Nose M.D.   On: 11/27/2022 00:22   DG Chest Portable 1 View  Result Date: 11/26/2022 CLINICAL DATA:  Recent drug overdose EXAM: PORTABLE CHEST 1 VIEW COMPARISON:  07/04/2021 CT FINDINGS: Cardiac  shadow  is prominent but accentuated by the frontal technique. Lungs are clear. No bony abnormality is noted. IMPRESSION: No acute abnormality seen. Electronically Signed   By: Alcide Clever M.D.   On: 11/26/2022 23:31    Micro Results    No results found for this or any previous visit (from the past 240 hour(s)).  Today   Subjective    Kevin [name] today has no headache,no chest abdominal pain,no new weakness tingling or numbness, feels much better wants to go home today.    Objective   Blood pressure (!) 137/92, pulse 90, temperature 98.6 F (37 C), temperature source Oral, resp. rate 18, height 5\' 5"  (1.651 m), weight 70.9 kg, SpO2 99 %.   Intake/Output Summary (Last 24 hours) at 12/01/2022 0859 Last data filed at 12/01/2022 0803 Gross per 24 hour  Intake 438.04 ml  Output 576 ml  Net -137.96 ml    Exam  Awake Alert, No new F.N deficits,    [place].AT,PERRAL Supple Neck,   Symmetrical Chest wall movement, Good air movement bilaterally, CTAB RRR,No Gallops,   +ve B.Sounds, Abd Soft, Non tender,  No Cyanosis, Clubbing or edema    Data Review   Recent Labs  Lab 11/26/22 2315 11/27/22 0900 11/28/22 0218 11/29/22 0332 11/30/22 0341 12/01/22 0335  WBC 5.8 8.1 7.2 7.5 7.5 7.2  HGB 11.8* 11.7* 11.1* 9.7* 10.6* 10.6*  HCT 35.1* 34.2* 32.5* 29.0* 31.1* 31.5*  PLT 299 281 276 250 280 290  MCV 95.9 95.5 94.2 93.5 93.4 96.0  MCH 32.2 32.7 32.2 31.3 31.8 32.3  MCHC 33.6 34.2 34.2 33.4 34.1 33.7  RDW 15.1 15.0 15.0 15.0 14.9 15.3  LYMPHSABS 1.3 1.0  --  1.1 1.1 1.1  MONOABS 0.7 0.8  --  1.0 1.0 1.2*  EOSABS 0.1 0.1  --  0.1 0.1 0.1  BASOSABS 0.1 0.0  --  0.0 0.0 0.1    Recent Labs  Lab 11/26/22 2315 11/27/22 0553 11/27/22 0900 11/27/22 1055 11/28/22 0218 11/28/22 0618 11/29/22 0332 11/30/22 0341 12/01/22 0335  NA 133*  --  132*  --  129*  --  127* 126* 132*  K 2.8*  --  3.5  --  3.2*  --  3.6 3.8 4.2  CL 99  --  99  --  95*  --  97* 96* 100  CO2 18*  --  22  --   24  --  22 22 22   ANIONGAP 16*  --  11  --  10  --  8 8 10   GLUCOSE 189*  --  83  --  105*  --  108* 96 101*  BUN 10  --  7  --  12  --  15 12 19   CREATININE 1.36*  --  0.82  --  1.11  --  1.05 0.90 1.11  AST 96*  --  70*  --   --   --  36 106* 96*  ALT 30  --  30  --   --   --  22 44 53*  ALKPHOS 119  --  119  --   --   --  100 110 102  BILITOT 1.4*  --  0.8  --   --   --  0.9 0.9 0.7  ALBUMIN 3.6  --  3.2*  --   --   --  2.6* 2.9* 3.0*  LATICACIDVEN  --   --  2.1* 2.3*  --   --   --   --   --  HGBA1C  --  4.6*  --   --   --   --   --   --   --   BNP  --   --   --   --   --  155.1* 70.3 53.9 41.0  MG  --  1.4*  --   --   --  1.1* 1.3* 1.2* 1.6*  CALCIUM 8.1*  --  8.1*  --  8.5*  --  8.4* 8.5* 9.1    Total Time in preparing paper work, data evaluation and todays exam - 35 minutes  Signature  -    Susa Raring M.D on 12/01/2022 at 8:59 AM   -  To page go to [email or web address]

## 2022-12-02 ENCOUNTER — Telehealth: Payer: Self-pay

## 2022-12-02 NOTE — Transitions of Care (Post Inpatient/ED Visit) (Signed)
   12/02/2022  Name: Kevin [name] MRN: [id number] DOB: 04-01-1971  Today's TOC FU Call Status: Today's TOC FU Call Status:: Unsuccessul Call (1st Attempt) Unsuccessful Call (1st Attempt) Date: 12/02/22  Attempted to reach the patient regarding the most recent Inpatient/ED visit.  Follow Up Plan: Additional outreach attempts will be made to reach the patient to complete the Transitions of Care (Post Inpatient/ED visit) call.   Need to clarify if the patient is going to [facility] for primary care.  He has an appointment there on 12/07/2022.   Signature  Robyne Peers, RN

## 2022-12-07 NOTE — Telephone Encounter (Signed)
Call placed to patient unable to reach unable to leave message recording received  "call cannot be completed at this time"
